# Patient Record
Sex: Female | Born: 1940 | Race: Black or African American | Hispanic: No | Smoking: Former smoker
Health system: Southern US, Community
[De-identification: ages and names within clinical notes are randomized; demographics above are authoritative.]

## PROBLEM LIST (undated history)

## (undated) DIAGNOSIS — E039 Hypothyroidism, unspecified: Secondary | ICD-10-CM

## (undated) DIAGNOSIS — E079 Disorder of thyroid, unspecified: Secondary | ICD-10-CM

## (undated) DIAGNOSIS — Z9221 Personal history of antineoplastic chemotherapy: Secondary | ICD-10-CM

## (undated) DIAGNOSIS — C7931 Secondary malignant neoplasm of brain: Secondary | ICD-10-CM

## (undated) DIAGNOSIS — I509 Heart failure, unspecified: Secondary | ICD-10-CM

## (undated) DIAGNOSIS — I10 Essential (primary) hypertension: Secondary | ICD-10-CM

## (undated) DIAGNOSIS — I4891 Unspecified atrial fibrillation: Secondary | ICD-10-CM

## (undated) DIAGNOSIS — J449 Chronic obstructive pulmonary disease, unspecified: Secondary | ICD-10-CM

## (undated) DIAGNOSIS — C3490 Malignant neoplasm of unspecified part of unspecified bronchus or lung: Secondary | ICD-10-CM

## (undated) DIAGNOSIS — E119 Type 2 diabetes mellitus without complications: Secondary | ICD-10-CM

## (undated) DIAGNOSIS — E785 Hyperlipidemia, unspecified: Secondary | ICD-10-CM

## (undated) DIAGNOSIS — Z923 Personal history of irradiation: Secondary | ICD-10-CM

## (undated) HISTORY — PX: LUNG BIOPSY: SHX232

## (undated) HISTORY — DX: Secondary malignant neoplasm of brain: C79.31

## (undated) HISTORY — PX: OTHER SURGICAL HISTORY: SHX169

## (undated) HISTORY — PX: JOINT REPLACEMENT: SHX530

---

## 2004-05-31 HISTORY — PX: COLONOSCOPY: SHX174

## 2004-06-15 ENCOUNTER — Ambulatory Visit: Payer: Self-pay

## 2004-07-09 ENCOUNTER — Ambulatory Visit: Payer: Self-pay | Admitting: Orthopaedic Surgery

## 2004-10-12 ENCOUNTER — Other Ambulatory Visit: Payer: Self-pay

## 2004-10-12 ENCOUNTER — Inpatient Hospital Stay: Payer: Self-pay | Admitting: Orthopaedic Surgery

## 2005-04-27 ENCOUNTER — Ambulatory Visit: Payer: Self-pay | Admitting: Family Medicine

## 2006-04-28 ENCOUNTER — Ambulatory Visit: Payer: Self-pay | Admitting: Family Medicine

## 2007-05-02 ENCOUNTER — Ambulatory Visit: Payer: Self-pay | Admitting: Family Medicine

## 2008-05-02 ENCOUNTER — Ambulatory Visit: Payer: Self-pay | Admitting: Family Medicine

## 2008-10-25 ENCOUNTER — Ambulatory Visit: Payer: Self-pay | Admitting: Unknown Physician Specialty

## 2009-05-05 ENCOUNTER — Ambulatory Visit: Payer: Self-pay | Admitting: Family Medicine

## 2010-05-14 ENCOUNTER — Ambulatory Visit: Payer: Self-pay | Admitting: Family Medicine

## 2011-06-08 ENCOUNTER — Ambulatory Visit: Payer: Self-pay | Admitting: Family Medicine

## 2011-07-23 ENCOUNTER — Ambulatory Visit: Payer: Self-pay | Admitting: Internal Medicine

## 2011-08-06 ENCOUNTER — Ambulatory Visit: Payer: Self-pay | Admitting: Internal Medicine

## 2011-08-06 LAB — CBC CANCER CENTER
Basophil #: 0 x10 3/mm (ref 0.0–0.1)
Basophil %: 0.4 %
Eosinophil #: 0.2 x10 3/mm (ref 0.0–0.7)
Lymphocyte %: 21.1 %
MCH: 33.9 pg (ref 26.0–34.0)
MCV: 100 fL (ref 80–100)
Monocyte #: 0.8 x10 3/mm — ABNORMAL HIGH (ref 0.0–0.7)
Neutrophil %: 65.8 %
Platelet: 224 x10 3/mm (ref 150–440)
RDW: 15 % — ABNORMAL HIGH (ref 11.5–14.5)

## 2011-08-12 LAB — IRON AND TIBC
Iron Bind.Cap.(Total): 376 ug/dL (ref 250–450)
Iron Saturation: 24 %

## 2011-08-12 LAB — FERRITIN: Ferritin (ARMC): 61 ng/mL (ref 8–388)

## 2011-08-30 ENCOUNTER — Ambulatory Visit: Payer: Self-pay | Admitting: Internal Medicine

## 2011-09-16 LAB — CANCER CENTER HEMATOCRIT: HCT: 44.9 % (ref 35.0–47.0)

## 2011-09-29 ENCOUNTER — Ambulatory Visit: Payer: Self-pay | Admitting: Internal Medicine

## 2011-09-30 LAB — CBC CANCER CENTER
Basophil #: 0 x10 3/mm (ref 0.0–0.1)
Basophil %: 0.6 %
Eosinophil %: 3.1 %
Lymphocyte #: 1.9 x10 3/mm (ref 1.0–3.6)
MCH: 33.3 pg (ref 26.0–34.0)
MCHC: 32.7 g/dL (ref 32.0–36.0)
MCV: 102 fL — ABNORMAL HIGH (ref 80–100)
Monocyte %: 9.2 %
Neutrophil #: 4.5 x10 3/mm (ref 1.4–6.5)
RBC: 4.75 10*6/uL (ref 3.80–5.20)
WBC: 7.4 x10 3/mm (ref 3.6–11.0)

## 2011-10-30 ENCOUNTER — Ambulatory Visit: Payer: Self-pay | Admitting: Internal Medicine

## 2011-11-11 LAB — CBC CANCER CENTER
Basophil #: 0 x10 3/mm (ref 0.0–0.1)
Eosinophil #: 0.2 x10 3/mm (ref 0.0–0.7)
HCT: 47.8 % — ABNORMAL HIGH (ref 35.0–47.0)
HGB: 15.6 g/dL (ref 12.0–16.0)
Lymphocyte #: 1.7 x10 3/mm (ref 1.0–3.6)
Lymphocyte %: 23.6 %
MCHC: 32.7 g/dL (ref 32.0–36.0)
MCV: 101 fL — ABNORMAL HIGH (ref 80–100)
Monocyte #: 0.8 x10 3/mm (ref 0.2–0.9)
Neutrophil #: 4.5 x10 3/mm (ref 1.4–6.5)
Neutrophil %: 62.3 %
Platelet: 229 x10 3/mm (ref 150–440)
RBC: 4.75 10*6/uL (ref 3.80–5.20)
RDW: 14.8 % — ABNORMAL HIGH (ref 11.5–14.5)
WBC: 7.3 x10 3/mm (ref 3.6–11.0)

## 2011-11-25 LAB — CANCER CENTER HEMATOCRIT: HCT: 48.1 % — ABNORMAL HIGH (ref 35.0–47.0)

## 2011-11-29 ENCOUNTER — Ambulatory Visit: Payer: Self-pay | Admitting: Internal Medicine

## 2011-12-16 LAB — CANCER CENTER HEMATOCRIT: HCT: 44.9 % (ref 35.0–47.0)

## 2011-12-30 ENCOUNTER — Ambulatory Visit: Payer: Self-pay | Admitting: Internal Medicine

## 2012-01-06 LAB — CBC CANCER CENTER
Basophil #: 0 x10 3/mm (ref 0.0–0.1)
Eosinophil #: 0.2 x10 3/mm (ref 0.0–0.7)
Lymphocyte #: 1.8 x10 3/mm (ref 1.0–3.6)
Lymphocyte %: 25.4 %
MCHC: 33.5 g/dL (ref 32.0–36.0)
MCV: 100 fL (ref 80–100)
Monocyte %: 11 %
Neutrophil #: 4.3 x10 3/mm (ref 1.4–6.5)
Neutrophil %: 60.7 %
Platelet: 218 x10 3/mm (ref 150–440)
RBC: 4.79 10*6/uL (ref 3.80–5.20)
RDW: 15.4 % — ABNORMAL HIGH (ref 11.5–14.5)

## 2012-01-20 LAB — CANCER CENTER HEMATOCRIT: HCT: 47 %

## 2012-01-30 ENCOUNTER — Ambulatory Visit: Payer: Self-pay | Admitting: Internal Medicine

## 2012-02-29 ENCOUNTER — Ambulatory Visit: Payer: Self-pay | Admitting: Internal Medicine

## 2012-03-23 LAB — CANCER CENTER HEMATOCRIT: HCT: 47.4 % — ABNORMAL HIGH (ref 35.0–47.0)

## 2012-03-31 ENCOUNTER — Ambulatory Visit: Payer: Self-pay | Admitting: Internal Medicine

## 2012-04-20 LAB — CBC CANCER CENTER
Basophil #: 0 x10 3/mm (ref 0.0–0.1)
Basophil %: 0.6 %
Eosinophil #: 0.2 x10 3/mm (ref 0.0–0.7)
Eosinophil %: 2.7 %
HCT: 48.2 % — ABNORMAL HIGH (ref 35.0–47.0)
HGB: 15.8 g/dL (ref 12.0–16.0)
Lymphocyte #: 2 x10 3/mm (ref 1.0–3.6)
Lymphocyte %: 25.5 %
MCH: 32.8 pg (ref 26.0–34.0)
MCHC: 32.7 g/dL (ref 32.0–36.0)
MCV: 100 fL (ref 80–100)
Monocyte #: 0.7 x10 3/mm (ref 0.2–0.9)
Monocyte %: 8.7 %
Neutrophil #: 5 x10 3/mm (ref 1.4–6.5)
Neutrophil %: 62.5 %
Platelet: 193 x10 3/mm (ref 150–440)
RBC: 4.82 10*6/uL (ref 3.80–5.20)
RDW: 14.9 % — ABNORMAL HIGH (ref 11.5–14.5)
WBC: 8 x10 3/mm (ref 3.6–11.0)

## 2012-04-30 ENCOUNTER — Ambulatory Visit: Payer: Self-pay | Admitting: Internal Medicine

## 2012-05-31 ENCOUNTER — Ambulatory Visit: Payer: Self-pay | Admitting: Internal Medicine

## 2012-06-08 ENCOUNTER — Ambulatory Visit: Payer: Self-pay | Admitting: Internal Medicine

## 2012-06-15 LAB — CANCER CENTER HEMATOCRIT: HCT: 47.9 % — ABNORMAL HIGH (ref 35.0–47.0)

## 2012-07-01 ENCOUNTER — Ambulatory Visit: Payer: Self-pay | Admitting: Internal Medicine

## 2012-07-06 LAB — CANCER CENTER HEMATOCRIT: HCT: 45.5 % (ref 35.0–47.0)

## 2012-07-21 ENCOUNTER — Emergency Department: Payer: Self-pay | Admitting: Unknown Physician Specialty

## 2012-07-22 LAB — CBC WITH DIFFERENTIAL/PLATELET
Basophil %: 0.2 %
Eosinophil %: 0.3 %
HGB: 14.9 g/dL (ref 12.0–16.0)
Lymphocyte #: 0.5 10*3/uL — ABNORMAL LOW (ref 1.0–3.6)
MCV: 99 fL (ref 80–100)
Monocyte #: 1.2 x10 3/mm — ABNORMAL HIGH (ref 0.2–0.9)
Neutrophil #: 12.2 10*3/uL — ABNORMAL HIGH (ref 1.4–6.5)
Neutrophil %: 86.9 %
Platelet: 203 10*3/uL (ref 150–440)
RBC: 4.4 10*6/uL (ref 3.80–5.20)
RDW: 14.9 % — ABNORMAL HIGH (ref 11.5–14.5)
WBC: 14 10*3/uL — ABNORMAL HIGH (ref 3.6–11.0)

## 2012-07-22 LAB — MAGNESIUM: Magnesium: 1.7 mg/dL — ABNORMAL LOW

## 2012-07-22 LAB — COMPREHENSIVE METABOLIC PANEL
Alkaline Phosphatase: 63 U/L (ref 50–136)
Anion Gap: 9 (ref 7–16)
BUN: 26 mg/dL — ABNORMAL HIGH (ref 7–18)
Bilirubin,Total: 0.5 mg/dL (ref 0.2–1.0)
Calcium, Total: 9.1 mg/dL (ref 8.5–10.1)
Co2: 29 mmol/L (ref 21–32)
Creatinine: 0.94 mg/dL (ref 0.60–1.30)
Glucose: 239 mg/dL — ABNORMAL HIGH (ref 65–99)
Osmolality: 296 (ref 275–301)
Potassium: 3.3 mmol/L — ABNORMAL LOW (ref 3.5–5.1)
SGOT(AST): 14 U/L — ABNORMAL LOW (ref 15–37)
SGPT (ALT): 19 U/L (ref 12–78)
Sodium: 142 mmol/L (ref 136–145)
Total Protein: 7.3 g/dL (ref 6.4–8.2)

## 2012-07-22 LAB — URINALYSIS, COMPLETE
Bilirubin,UR: NEGATIVE
Blood: NEGATIVE
Hyaline Cast: 4
Ph: 5 (ref 4.5–8.0)
Protein: 100
RBC,UR: 2 /HPF (ref 0–5)
Squamous Epithelial: 3
WBC UR: 3 /HPF (ref 0–5)

## 2012-07-22 LAB — TROPONIN I
Troponin-I: 0.03 ng/mL
Troponin-I: <0.02 ng/mL

## 2012-07-26 ENCOUNTER — Inpatient Hospital Stay: Payer: Self-pay | Admitting: Internal Medicine

## 2012-07-26 LAB — T4, FREE: Free Thyroxine: 1.55 ng/dL — ABNORMAL HIGH (ref 0.76–1.46)

## 2012-07-26 LAB — LIPID PANEL
Ldl Cholesterol, Calc: 66 mg/dL (ref 0–100)
Triglycerides: 72 mg/dL (ref 0–200)
VLDL Cholesterol, Calc: 14 mg/dL (ref 5–40)

## 2012-07-26 LAB — URINALYSIS, COMPLETE
Bacteria: NONE SEEN
Blood: NEGATIVE
Leukocyte Esterase: NEGATIVE
Nitrite: NEGATIVE
Ph: 7 (ref 4.5–8.0)
Protein: NEGATIVE
WBC UR: 1 /HPF (ref 0–5)

## 2012-07-26 LAB — COMPREHENSIVE METABOLIC PANEL
BUN: 14 mg/dL (ref 7–18)
Bilirubin,Total: 0.5 mg/dL (ref 0.2–1.0)
Chloride: 99 mmol/L (ref 98–107)
Osmolality: 277 (ref 275–301)
Potassium: 3.8 mmol/L (ref 3.5–5.1)
SGPT (ALT): 25 U/L (ref 12–78)

## 2012-07-26 LAB — TROPONIN I
Troponin-I: <0.02 ng/mL
Troponin-I: <0.02 ng/mL

## 2012-07-26 LAB — CBC
Platelet: 236 10*3/uL (ref 150–440)
RBC: 4.17 10*6/uL (ref 3.80–5.20)

## 2012-07-26 LAB — HEMOGLOBIN A1C: Hemoglobin A1C: 7.1 % — ABNORMAL HIGH (ref 4.2–6.3)

## 2012-07-26 LAB — APTT: Activated PTT: 29.8 secs (ref 23.6–35.9)

## 2012-07-26 LAB — CK TOTAL AND CKMB (NOT AT ARMC)
CK, Total: 88 U/L (ref 21–215)
CK-MB: <0.5 ng/mL — ABNORMAL LOW (ref 0.5–3.6)

## 2012-07-27 LAB — CBC WITH DIFFERENTIAL/PLATELET
Basophil #: 0.1 10*3/uL (ref 0.0–0.1)
Eosinophil %: 0.8 %
HCT: 38 % (ref 35.0–47.0)
HGB: 12.1 g/dL (ref 12.0–16.0)
Lymphocyte #: 1.1 10*3/uL (ref 1.0–3.6)
Lymphocyte %: 9.5 %
MCH: 31.6 pg (ref 26.0–34.0)
MCHC: 31.9 g/dL — ABNORMAL LOW (ref 32.0–36.0)
Monocyte #: 1.3 x10 3/mm — ABNORMAL HIGH (ref 0.2–0.9)
Monocyte %: 11.1 %
Platelet: 251 10*3/uL (ref 150–440)
RBC: 3.83 10*6/uL (ref 3.80–5.20)
WBC: 12.1 10*3/uL — ABNORMAL HIGH (ref 3.6–11.0)

## 2012-07-27 LAB — BASIC METABOLIC PANEL
Anion Gap: 7 (ref 7–16)
Co2: 31 mmol/L (ref 21–32)
Creatinine: 0.61 mg/dL (ref 0.60–1.30)
EGFR (African American): >60
EGFR (Non-African Amer.): >60
Glucose: 144 mg/dL — ABNORMAL HIGH (ref 65–99)
Osmolality: 281 (ref 275–301)

## 2012-07-27 LAB — MAGNESIUM: Magnesium: 1.8 mg/dL

## 2012-07-29 ENCOUNTER — Ambulatory Visit: Payer: Self-pay

## 2012-07-29 ENCOUNTER — Ambulatory Visit: Payer: Self-pay | Admitting: Internal Medicine

## 2012-08-14 ENCOUNTER — Inpatient Hospital Stay: Payer: Self-pay | Admitting: Internal Medicine

## 2012-08-14 LAB — CBC WITH DIFFERENTIAL/PLATELET
Basophil #: 0 10*3/uL (ref 0.0–0.1)
Eosinophil #: 0 10*3/uL (ref 0.0–0.7)
Eosinophil %: 0.1 %
HGB: 12.9 g/dL (ref 12.0–16.0)
Lymphocyte #: 0.6 10*3/uL — ABNORMAL LOW (ref 1.0–3.6)
MCH: 31.1 pg (ref 26.0–34.0)
MCHC: 31.9 g/dL — ABNORMAL LOW (ref 32.0–36.0)
Monocyte #: 0.9 x10 3/mm (ref 0.2–0.9)
Monocyte %: 8.9 %
Neutrophil #: 8.2 10*3/uL — ABNORMAL HIGH (ref 1.4–6.5)
Neutrophil %: 84.1 %
RDW: 15.7 % — ABNORMAL HIGH (ref 11.5–14.5)
WBC: 9.7 10*3/uL (ref 3.6–11.0)

## 2012-08-14 LAB — COMPREHENSIVE METABOLIC PANEL
Albumin: 2.4 g/dL — ABNORMAL LOW (ref 3.4–5.0)
Alkaline Phosphatase: 65 U/L (ref 50–136)
BUN: 19 mg/dL — ABNORMAL HIGH (ref 7–18)
Calcium, Total: 8.1 mg/dL — ABNORMAL LOW (ref 8.5–10.1)
Chloride: 101 mmol/L (ref 98–107)
Creatinine: 0.73 mg/dL (ref 0.60–1.30)
EGFR (Non-African Amer.): >60
Glucose: 147 mg/dL — ABNORMAL HIGH (ref 65–99)
Potassium: 4.7 mmol/L (ref 3.5–5.1)
SGPT (ALT): 29 U/L (ref 12–78)
Sodium: 137 mmol/L (ref 136–145)
Total Protein: 7 g/dL (ref 6.4–8.2)

## 2012-08-14 LAB — CK TOTAL AND CKMB (NOT AT ARMC)
CK, Total: 48 U/L (ref 21–215)
CK, Total: 43 U/L (ref 21–215)
CK-MB: 0.6 ng/mL (ref 0.5–3.6)

## 2012-08-14 LAB — TROPONIN I: Troponin-I: <0.02 ng/mL

## 2012-08-15 DIAGNOSIS — I7100 Dissection of unspecified site of aorta: Secondary | ICD-10-CM | POA: Insufficient documentation

## 2012-08-15 LAB — BASIC METABOLIC PANEL
Anion Gap: 4 — ABNORMAL LOW (ref 7–16)
Calcium, Total: 8.1 mg/dL — ABNORMAL LOW (ref 8.5–10.1)
Chloride: 100 mmol/L (ref 98–107)
Co2: 35 mmol/L — ABNORMAL HIGH (ref 21–32)
EGFR (African American): >60
EGFR (Non-African Amer.): >60
Glucose: 181 mg/dL — ABNORMAL HIGH (ref 65–99)
Potassium: 4.7 mmol/L (ref 3.5–5.1)
Sodium: 139 mmol/L (ref 136–145)

## 2012-08-15 LAB — URINALYSIS, COMPLETE
Bilirubin,UR: NEGATIVE
Glucose,UR: NEGATIVE mg/dL (ref 0–75)
Ketone: NEGATIVE
Ph: 5 (ref 4.5–8.0)
RBC,UR: 103 /HPF (ref 0–5)
Specific Gravity: 1.018 (ref 1.003–1.030)
WBC UR: 5 /HPF (ref 0–5)

## 2012-08-15 LAB — CBC WITH DIFFERENTIAL/PLATELET
Basophil #: 0 10*3/uL (ref 0.0–0.1)
HGB: 12.9 g/dL (ref 12.0–16.0)
Lymphocyte %: 3.7 %
MCH: 31.1 pg (ref 26.0–34.0)
MCHC: 31.9 g/dL — ABNORMAL LOW (ref 32.0–36.0)
MCV: 97 fL (ref 80–100)
Monocyte %: 1.1 %
Neutrophil #: 6.6 10*3/uL — ABNORMAL HIGH (ref 1.4–6.5)
RBC: 4.14 10*6/uL (ref 3.80–5.20)
RDW: 15.7 % — ABNORMAL HIGH (ref 11.5–14.5)
WBC: 6.9 10*3/uL (ref 3.6–11.0)

## 2012-08-15 LAB — TROPONIN I: Troponin-I: <0.02 ng/mL

## 2012-09-21 ENCOUNTER — Inpatient Hospital Stay: Payer: Self-pay | Admitting: Surgery

## 2012-09-21 LAB — APTT: Activated PTT: 31.7 secs (ref 23.6–35.9)

## 2012-09-21 LAB — CBC
HCT: 35.4 % (ref 35.0–47.0)
MCHC: 31.9 g/dL — ABNORMAL LOW (ref 32.0–36.0)
MCV: 93 fL (ref 80–100)
Platelet: 286 10*3/uL (ref 150–440)
RBC: 3.82 10*6/uL (ref 3.80–5.20)
RDW: 16.5 % — ABNORMAL HIGH (ref 11.5–14.5)
WBC: 9.9 10*3/uL (ref 3.6–11.0)

## 2012-09-21 LAB — BASIC METABOLIC PANEL
BUN: 12 mg/dL (ref 7–18)
Chloride: 101 mmol/L (ref 98–107)
Co2: 31 mmol/L (ref 21–32)
Creatinine: 0.72 mg/dL (ref 0.60–1.30)
EGFR (Non-African Amer.): >60
Osmolality: 284 (ref 275–301)

## 2012-09-21 LAB — PROTIME-INR: INR: 1.4

## 2012-09-22 LAB — BASIC METABOLIC PANEL
Anion Gap: 4 — ABNORMAL LOW (ref 7–16)
BUN: 16 mg/dL (ref 7–18)
Co2: 33 mmol/L — ABNORMAL HIGH (ref 21–32)
Creatinine: 0.66 mg/dL (ref 0.60–1.30)
EGFR (African American): >60
EGFR (Non-African Amer.): >60
Glucose: 129 mg/dL — ABNORMAL HIGH (ref 65–99)
Osmolality: 284 (ref 275–301)
Potassium: 3.7 mmol/L (ref 3.5–5.1)

## 2012-09-22 LAB — CBC WITH DIFFERENTIAL/PLATELET
Basophil #: 0 10*3/uL (ref 0.0–0.1)
Eosinophil #: 0.3 10*3/uL (ref 0.0–0.7)
Eosinophil %: 2.8 %
Lymphocyte #: 1.4 10*3/uL (ref 1.0–3.6)
Lymphocyte %: 14.9 %
MCHC: 32.5 g/dL (ref 32.0–36.0)
MCV: 93 fL (ref 80–100)
Monocyte %: 12.4 %
Neutrophil %: 69.5 %
RBC: 3.58 10*6/uL — ABNORMAL LOW (ref 3.80–5.20)

## 2012-09-23 LAB — PROTIME-INR: Prothrombin Time: 18.8 secs — ABNORMAL HIGH (ref 11.5–14.7)

## 2012-12-19 ENCOUNTER — Ambulatory Visit: Payer: Self-pay | Admitting: Cardiology

## 2013-05-31 DIAGNOSIS — C3490 Malignant neoplasm of unspecified part of unspecified bronchus or lung: Secondary | ICD-10-CM

## 2013-05-31 HISTORY — DX: Malignant neoplasm of unspecified part of unspecified bronchus or lung: C34.90

## 2013-06-11 ENCOUNTER — Ambulatory Visit: Payer: Self-pay | Admitting: Internal Medicine

## 2013-07-16 DIAGNOSIS — Z7901 Long term (current) use of anticoagulants: Secondary | ICD-10-CM | POA: Insufficient documentation

## 2013-08-01 DIAGNOSIS — E1140 Type 2 diabetes mellitus with diabetic neuropathy, unspecified: Secondary | ICD-10-CM | POA: Insufficient documentation

## 2013-08-06 DIAGNOSIS — K208 Other esophagitis without bleeding: Secondary | ICD-10-CM | POA: Insufficient documentation

## 2014-03-17 DIAGNOSIS — I700 Atherosclerosis of aorta: Secondary | ICD-10-CM | POA: Insufficient documentation

## 2014-03-18 IMAGING — CT CT HEAD WITHOUT CONTRAST
1 series · 15 of 30 positions shown, 19 images · non-contrast
Comparison: none

REASON FOR EXAM: syncope
COMMENTS:

[Series 2: soft tissue · axial · 0.42mm/px · z∈[-156,-16]mm · 15 of 32 slices shown, 19 images]
[im 2/32  brain]
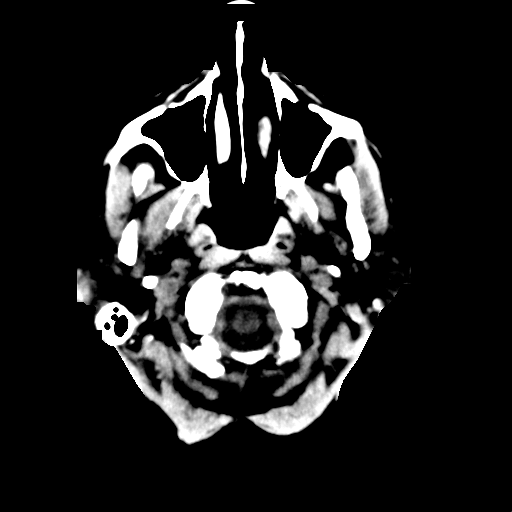
[im 2/32  bone]
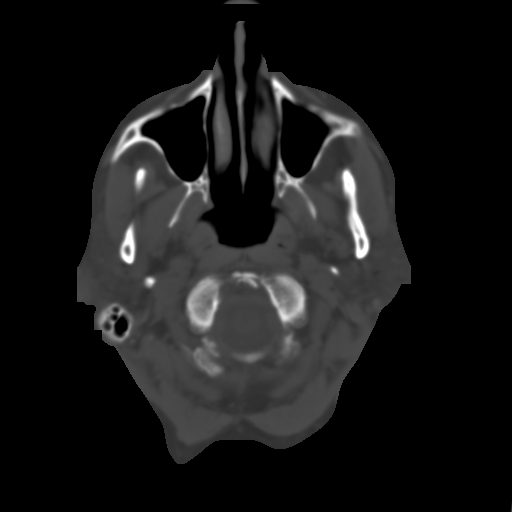
[im 4/32  brain]
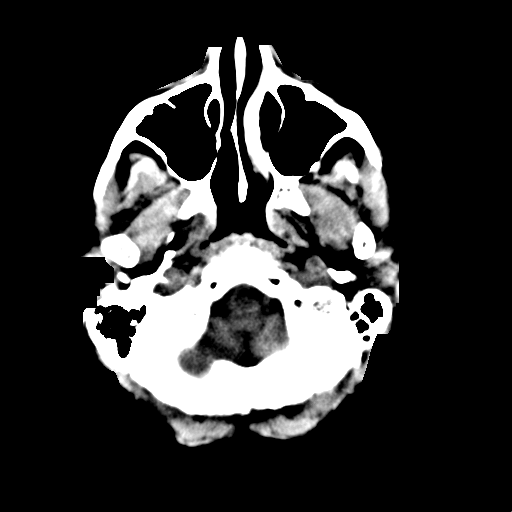
[im 6/32  brain]
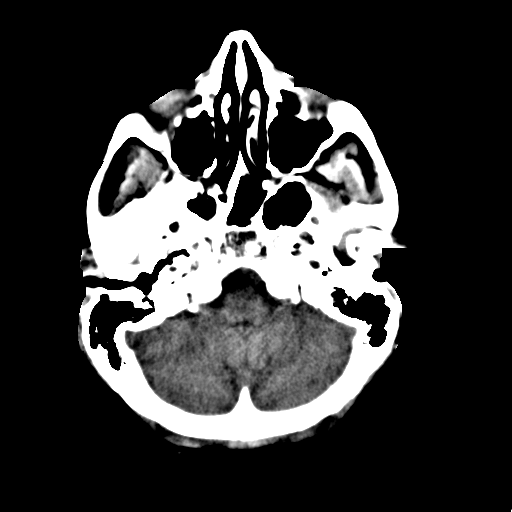
[im 8/32  brain]
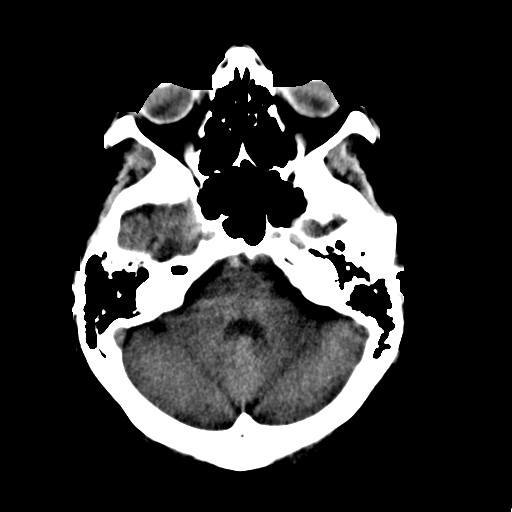
[im 10/32  brain]
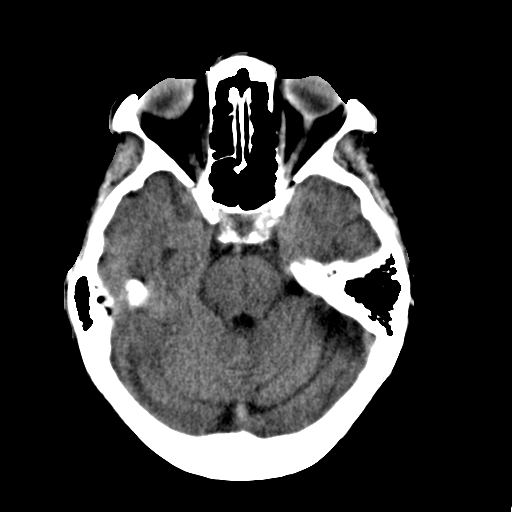
[im 10/32  bone]
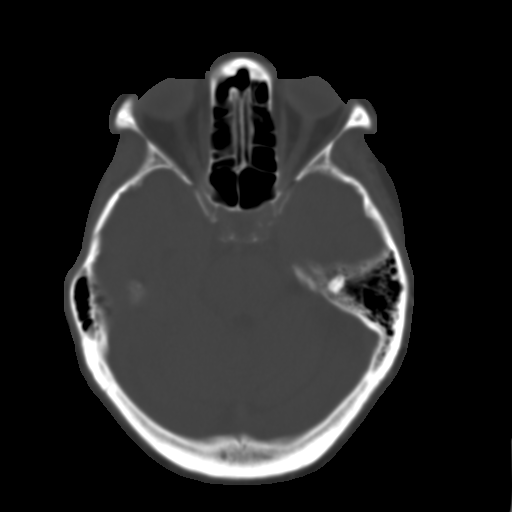
[im 12/32  brain]
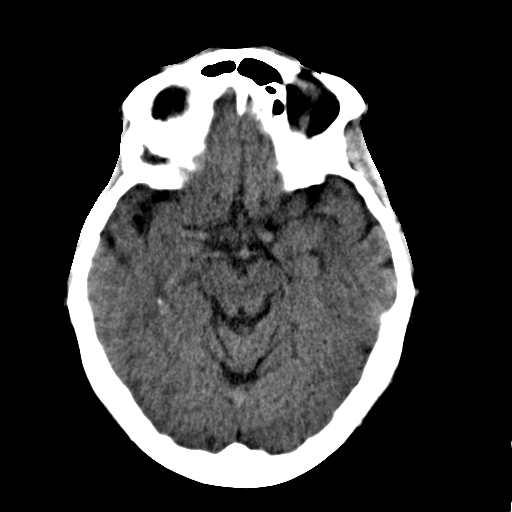
[im 14/32  brain]
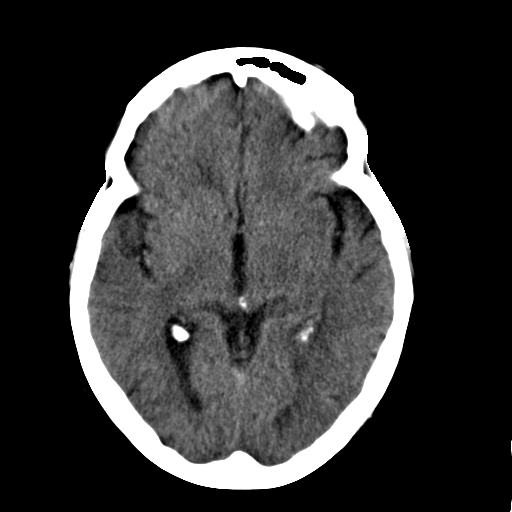
[im 17/32  brain]
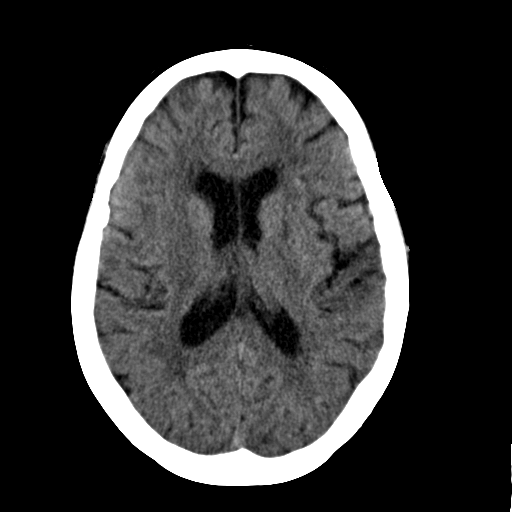
[im 18/32  brain]
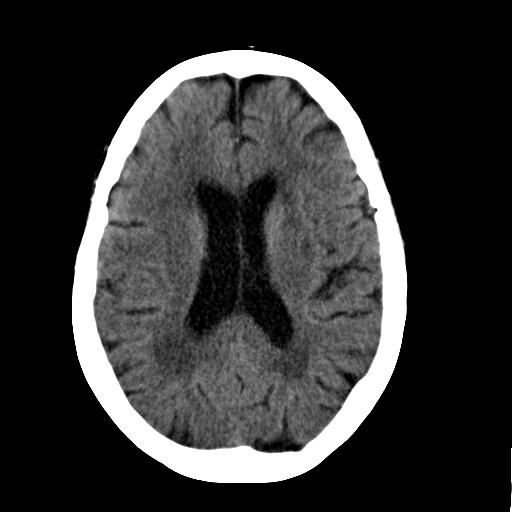
[im 18/32  bone]
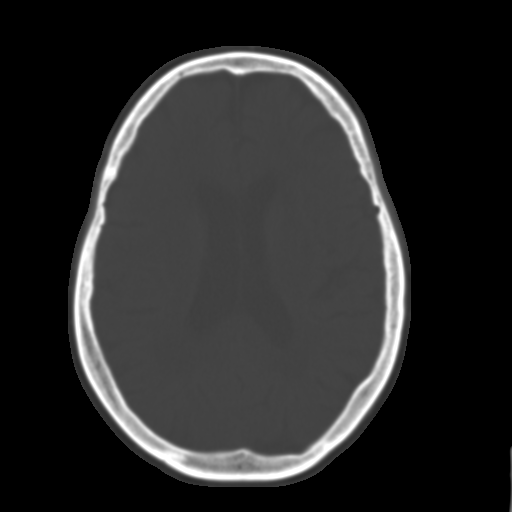
[im 20/32  brain]
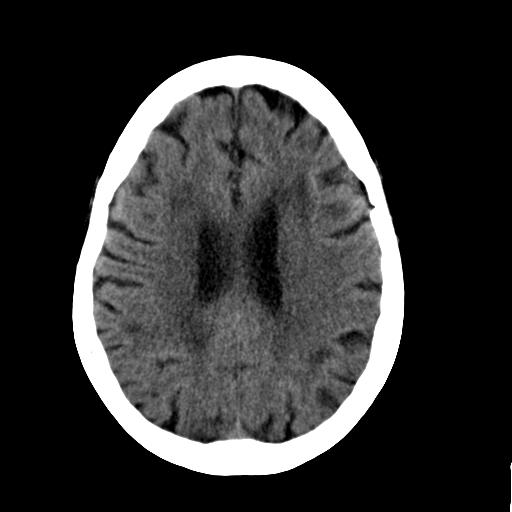
[im 22/32  brain]
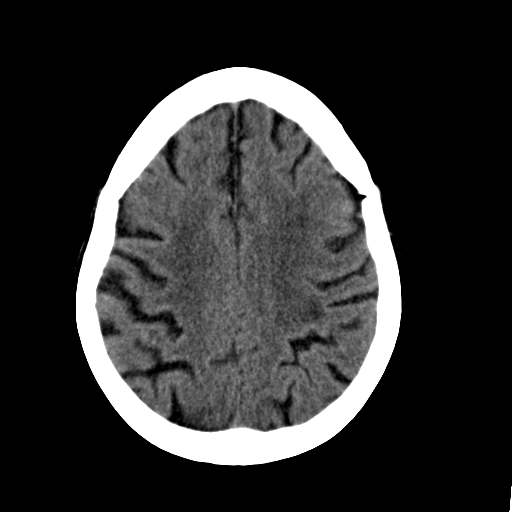
[im 24/32  brain]
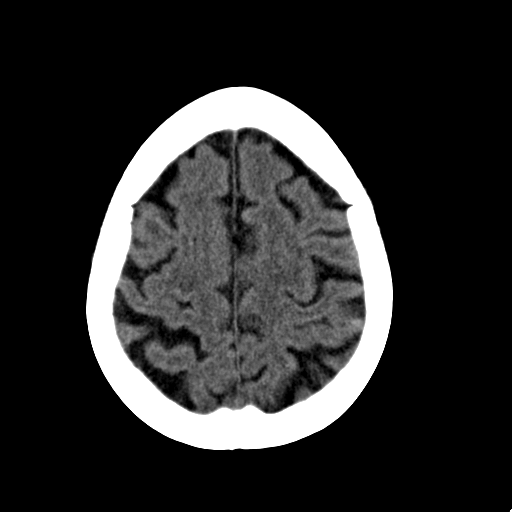
[im 26/32  brain]
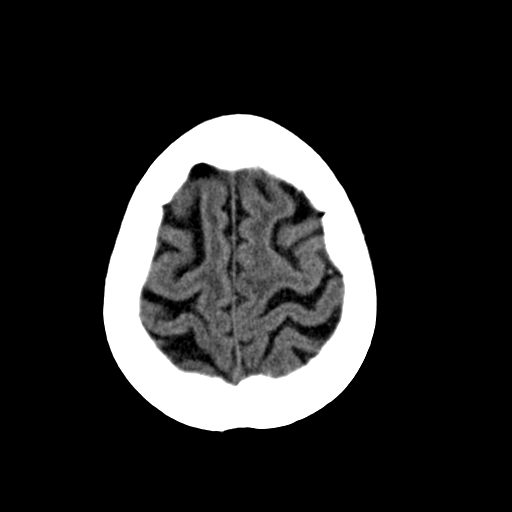
[im 26/32  bone]
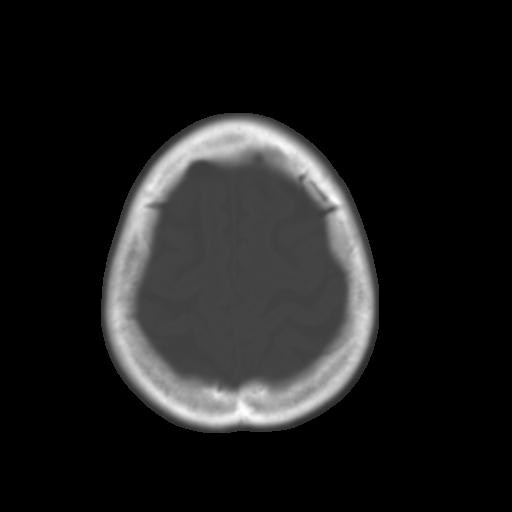
[im 28/32  brain]
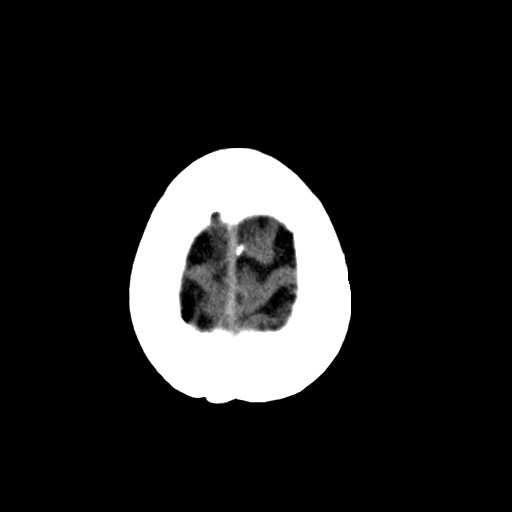
[im 30/32  brain]
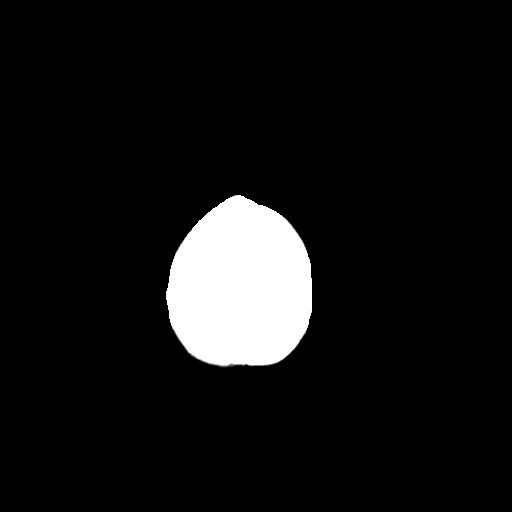

[15 of 30 positions shown; findings below may reference images not displayed]

PROCEDURE:     CT  - CT HEAD WITHOUT CONTRAST  - July 22, 2012  [DATE]

RESULT:     Axial noncontrast CT scanning was performed through the brain
with reconstructions at 5 mm intervals and slice thicknesses.

There is mild diffuse cerebral and cerebellar atrophy consistent with the
patient's age. There is no intracranial hemorrhage nor intracranial mass
effect or evidence of an evolving ischemic infarction. There are decreased
densities in the deep white matter of both cerebral hemispheres and in the
left basal ganglia consistent with the sequelae of chronic small vessel
ischemic type change. At bone window settings the observed portions of the
paranasal sinuses and mastoid air cells are clear. There is no evidence of
an acute skull fracture.
IMPRESSION: 1. There is no evidence of an acute intracranial hemorrhage nor other
evidence of acute intracranial injury.
2. There are white matter density changes consistent with chronic small
vessel ischemia.
3. There is no evidence of an acute skull fracture.

A preliminary report was sent to the [HOSPITAL] the conclusion
of the study.

[REDACTED]

## 2014-03-22 DIAGNOSIS — G4733 Obstructive sleep apnea (adult) (pediatric): Secondary | ICD-10-CM | POA: Insufficient documentation

## 2014-07-16 ENCOUNTER — Ambulatory Visit: Payer: Self-pay | Admitting: Internal Medicine
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
## 2014-09-20 NOTE — H&P (Signed)
PATIENT NAME:  Kristen [name], Kristen [name] MR#:  [id number] DATE OF BIRTH:  31-Jan-1941  DATE OF ADMISSION:  09/21/2012  ADMITTING PHYSICIAN: Rodena Goldmann, III, MD  PRIMARY CARE PHYSICIAN: Frazier Richards, MD  PULMONOLOGIST:  Wallene Huh, MD    CHIEF COMPLAINT: Shortness of breath and chest pain.   BRIEF HISTORY: The patient is a 74 year old woman seen in the Emergency Room with the onset of shortness of breath and mild left chest pain yesterday. The onset was sudden and prompted her to present to the Emergency Room for further evaluation as her symptoms did not improve.   She has a recent history of identification of a left upper lobe mass in her lung. Bronchoscopy was performed which was not diagnostic, and she underwent a needle biopsy 4 days ago. Biopsy results are not available as yet. The workup in the Emergency Room suggested a hemopneumothorax on the left side likely consistent with an iatrogenic pneumothorax from her needle biopsy. Thoracic Surgery service was consulted, and the chest tube was placed. Surgical service is consulted for admission.   During her last admission, she was noted to be in significant atrial fibrillation with a rapid ventricular response and was transferred to [facility] for an ablation procedure. At the same time, she was noted to have what appeared to be an aortic dissection with a surrounding hematoma. She was evaluated for that process at [facility], placed on blood pressure control and told that she would likely need surgical intervention once her atrial fibrillation was stabilized. She presents to [facility] with acute symptoms and is admitted for further evaluation.   PAST MEDICAL HISTORY: Her medical problems are numerous.  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   PAST SURGICAL HISTORY:  Cholecystectomy, bilateral knee surgery and a thyroid surgery.   CURRENT MEDICATIONS: Include Carvedilol 3.125 p.o. b.i.d., Lasix 40 mg p.o. daily, Lipitor 20 mg p.o. daily, magnesium oxide 400 mg once a day, metformin 1000 mg b.i.d. and warfarin 5 mg p.o. daily.  LABORATORY DATA:  Most recent in the Emergency Room demonstrate a hemoglobin of 11.3, normal white blood cell count. Prothrombin time of 16.8 with an INR of 1.4.   PHYSICAL EXAMINATION: GENERAL: She is an alert, comfortable woman having dinner at the time of my examination. A chest tube has been placed by the Thoracic Surgery service. It is draining serosanguineous fluid.  VITALS: Blood pressure is 130/69, heart rate 68 and regular. Oxygen saturation is 99% on 2 liters.  HEENT: No scleral icterus. No facial deformity. She appears a little pale.  NECK: Supple without adenopathy. Trachea is midline.  CHEST: Clear with good breath sounds bilaterally. Chest tube is placed on the left side.  CARDIAC: No murmurs or gallops to my ear. She seems to be in normal sinus rhythm.  ABDOMEN: Her abdomen is soft, nontender, with no organomegaly, no rebound, no guarding. No masses. No hernias.  EXTREMITIES: Lower extremity exam reveals mild edema, full range of motion.  PSYCHIATRIC: Normal orientation, normal affect.   IMPRESSION AND PLAN: This woman presents with a likely traumatic pneumothorax from her previous needle biopsy. A chest tube has been placed. She also has what appears to be an aortic dissection. This has not been followed recently by CT scan. She told tells me that the thoracic surgeons at [facility] have recommended delaying any intervention until she has stabilized from her atrial ablation. I will  admit her for chest tube followup.  At the present time, she has a small air leak.   This plan has been discussed with the patient, and she is in agreement.  ____________________________ Micheline Maze, MD rle:cb D: 09/21/2012 17:19:27  ET T: 09/21/2012 17:36:39 ET JOB#: [id number] cc: Rodena Goldmann III, MD, <Dictator> Timothy E. Genevive Bi, MD Ocie Cornfield. Ouida Sills, MD Herbon E. Raul Del, [place] MD ELECTRONICALLY SIGNED 09/22/2012 10:45

## 2014-09-20 NOTE — Discharge Summary (Signed)
PATIENT NAME:  Kristen, [name] MR#:  [id number] DATE OF BIRTH:  [birth year]  DATE OF ADMISSION:  08/14/2012 DATE OF DISCHARGE:  08/15/2012   DISCHARGE DIAGNOSES: 1.  Type A thoracic aortic aneurysm dissection.  2.  Thoracic aortic plaque with hematoma.  3.  Large pleural effusion.  4.  Atrial fibrillation, rapid on admission, now improved.   DISCHARGE MEDICATIONS: Ultimately will be per [facility] Presently she is on amiodarone 400 mg daily, diltiazem 60 mg p.o. q.6 hours, furosemide 40 mg IV every 12 hours, sliding scale insulin before meals and at bedtime, Solu-Medrol 80 mg q.8 hours, Toprol-XL 200 mg daily, morphine p.r.n., Zofran p.r.n., Protonix IV 40 mg every 8 hours,  potassium chloride 20 mEq daily, Xarelto 20 mg daily, levofloxacin 250 mg daily.   HISTORY AND PHYSICAL: Please see detailed dictated history and physical by Dr. Doy Hutching on admission.  Briefly admitted recent atrial fibrillation, more tachycardic in the office and hypoxic.   HOSPITAL COURSE: Found to have a large left pleural effusion and was very tight as far as her breathing.  Solu-Medrol and Levaquin was started. CT scan ultimately was done to follow up the left pleural effusion with the above diagnosis being found. Dr. Genevive Bi, who is the thoracic surgeon, does not do cardiac and/or upper thoracic aneurysms and our vascular surgeons do not do thoracic aneurysm repair; therefore transfer needs to be done. Dr. Miquel Dunn who is her cardiologist, had been consulted. He reviewed the case with his colleagues from [facility] and they have agreed to transfer the patient. This is being arranged rapidly given the above situation. He ultimately may need a thoracentesis as well. There was a small mass seen on the CT per radiology as well.   Please note with help with transfer, evaluating the patient, dictating, etc., took approximately an hour and a half to do all the discharge  tasks.   ____________________________ Ocie Cornfield. Ouida Sills, MD mwa:ct D: 08/15/2012 11:57:09 ET T: 08/15/2012 12:49:00 ET JOB#: [id number]  cc: Ocie Cornfield. Ouida Sills, MD, <Dictator> Kirk Ruths MD ELECTRONICALLY SIGNED 08/18/2012 7:08

## 2014-09-20 NOTE — H&P (Signed)
PATIENT NAME:  Kristen, [name] MR#:  [id number] DATE OF BIRTH:  Mar 09, 1941  DATE OF ADMISSION:  08/14/2012  FAMILY PHYSICIAN: Frazier Richards, MD   REASON FOR ADMISSION: Rapid A-flutter with acute respiratory failure and congestive heart failure.   HISTORY OF PRESENT ILLNESS: The patient is a 74 year old female followed by Dr. Ouida [name] with a significant history of benign hypertension, hypothyroidism, and type 2 diabetes who was hospitalized within the past 3 to 4 weeks with new onset atrial flutter. She was discharged on amiodarone and Toprol. She has continued to have problems with rate control. Diltiazem was recently added. She presents to the office today with 1 to 2 day history of worsening shortness of breath. In the office, the patient was noted to be hypoxic with a sat of 83% on room air, in rapid atrial flutter. She denies chest pain. She is having some weakness and peripheral edema. She is now admitted for further evaluation.   PAST MEDICAL HISTORY: 1.  New onset atrial flutter.  2.  Benign hypertension.  3.  Hypothyroidism.  4.  Type 2 diabetes.  5.  Obesity.  6.  Chronic obstructive pulmonary disease/tobacco abuse.  7.  Osteoarthritis.  8.  History of colonic polyps.  9.  Peripheral neuropathy.  10.  History of beta blocker-induced bradycardia.  11.  Status post cholecystectomy.  12.  Status post bilateral knee surgery.  13.  Status post thyroidectomy.   MEDICATIONS: 1.  Synthroid 50 mcg p.o. daily.  2.  Metformin 500 mg p.o. b.i.d.  3.  Prilosec 20 mg p.o. daily.  4.  Vitamin D 2000 units p.o. daily.  5.  Toprol-XL 200 mg p.o. daily.  6.  Amiodarone 400 mg p.o. daily.  7.  Xarelto 20 mg p.o. daily.  8.  Cardizem CD 180 mg p.o. daily.  9.  Hydrochlorothiazide 12.5 mg p.o. daily.   ALLERGIES: No known drug allergies.   SOCIAL HISTORY: The patient quit smoking 1 month ago. Denies alcohol abuse.   FAMILY HISTORY: Positive for lung cancer, CHF, stroke, and bone cancer.    REVIEW OF SYSTEMS:  As per HPI.  PHYSICAL EXAMINATION: GENERAL: The patient is acutely ill-appearing, in moderate distress.  VITAL SIGNS: Remarkable for a blood pressure of 128/94 with a heart rate of 125 and a sat of 83% on room air. Weight 199.  HEENT: Normocephalic, atraumatic. Pupils equally round and reactive to light and accommodation. Extraocular movements intact. Sclerae are anicteric. Conjunctivae are clear. Oropharynx is clear.  NECK: Supple with JVD noted to the angle of the jaw. No bruits were noted.  PULMONARY:  Lungs revealed rales approximately one-half way up bilaterally. No wheezes or rhonchi. No dullness.  CARDIAC: Revealed a rapid rate with a regular rhythm. Normal S1 and S2. There is a 2/6 systolic murmur noted throughout the precordium. No rubs or gallops are present.  ABDOMEN: Soft and nontender with normoactive bowel sounds. No organomegaly or masses were appreciated. No hernias or bruits were noted.  EXTREMITIES: 2+ edema with stasis changes. Pulses were 2+ bilaterally.  SKIN: Warm and dry without rash or lesions.  NEUROLOGIC: Cranial nerves II through XII grossly intact. Deep tendon reflexes were symmetric. Motor and sensory exam is nonfocal.  PSYCH: The patient was alert and oriented to person, place, and time. She was cooperative and used good judgment.   DIAGNOSTIC DATA: EKG revealed atrial flutter at 125 beats per minute with diffuse ST-T wave changes, which were nonspecific.   ASSESSMENT: 1.  Rapid atrial flutter.  2.  Acute diastolic congestive heart failure.  3.  Benign hypertension.  4.  Hypothyroidism.  5.  Hyperlipidemia.  6.  Type 2 diabetes.  7.  Acute respiratory failure.  8.  Chronic obstructive pulmonary disease/tobacco abuse.   PLAN: The patient will be admitted to the intensive care unit on a Cardizem drip with Lovenox and p.o. Toprol and amiodarone. We will obtain a chest x-ray and labs on admission. We will follow serial cardiac enzymes. We  will follow her sugars with Accu-Cheks before meals and at bedtime and add sliding scale insulin as needed. We will begin IV Lasix for diuresis. Will consult cardiology urgently. Follow up labs and chest x-ray in the morning. Begin oxygen supplementation and wean as tolerated. Further treatment and evaluation will depend upon the patient's progress.   TOTAL TIME SPENT: 50 minutes. ____________________________ Leonie Douglas Doy Hutching, MD jds:sb D: 08/14/2012 11:21:51 ET T: 08/14/2012 11:42:25 ET JOB#: [id number]  cc: Leonie Douglas. Doy Hutching, MD, <Dictator> Ocie Cornfield. Kristen Sills, MD JEFFREY Lennice Sites MD ELECTRONICALLY SIGNED 08/14/2012 20:23

## 2014-09-20 NOTE — Consult Note (Signed)
Brief Consult Note: Diagnosis: AFL, CHF, COPD.   Patient was seen by consultant.   Consult note dictated.   Comments: REC  Agree with current therapy, dc Lovenox, restart Xarelto, dc aspirin, start dilt 60 mg Q6, don't start dilt drip.  Electronic Signatures: Isaias Cowman (MD)  (Signed 17-Mar-14 15:23)  Authored: Brief Consult Note   Last Updated: 17-Mar-14 15:23 by Isaias Cowman (MD)

## 2014-09-20 NOTE — Op Note (Signed)
PATIENT NAME:  Kristen [name], Kristen [name] MR#:  [id number] DATE OF BIRTH:  March 29, 1941  DATE OF PROCEDURE:  08/14/2012  PREOPERATIVE DIAGNOSES: 1.  Respiratory failure with hypoxia.  2.  Atrial fibrillation.  3.  Diastolic congestive heart failure.  4.  Left pleural effusion.  5.  Poor venous access.   POSTOPERATIVE DIAGNOSES: 1.  Respiratory failure with hypoxia.  2.  Atrial fibrillation.  3.  Diastolic congestive heart failure.  4.  Left pleural effusion.  5.  Poor venous access.  PROCEDURES:  1. Ultrasound guidance for vascular access to right basilic vein.  2. Fluoroscopic guidance for placement of catheter.  3. Insertion of peripherally inserted central venous catheter, double lumen, right arm.  SURGEON:  Cianni Manny  ANESTHESIA:  Local.   ESTIMATED BLOOD LOSS:  Minimal.   INDICATION FOR PROCEDURE: This is a 74 year old female with poor venous access and multiple medical issues as described above. We are asked to place a durable venous access with PICC line. Risks and benefits were discussed.   DESCRIPTION OF PROCEDURE:  The patient's right arm was sterilely prepped and draped, and a sterile surgical field was created. The right basilic vein was accessed under direct ultrasound guidance without difficulty with a micropuncture needle and permanent image was recorded. 0.018 wire was then placed into the superior vena cava. Peel-away sheath was placed over the wire. A double lumen peripherally inserted central venous catheter was then placed over the wire and the wire and peel-away sheath were removed. The catheter tip was placed into the superior vena cava and was secured at the skin at 37 cm with a sterile dressing. The catheter withdrew blood well and flushed easily with heparinized saline. The patient tolerated procedure well.    ____________________________ Algernon Huxley, MD jsd:ce D: 08/16/2012 15:43:14 ET T: 08/16/2012 15:52:47 ET JOB#: [id number]  cc: Algernon Huxley, MD, <Dictator> Algernon Huxley  MD ELECTRONICALLY SIGNED 08/18/2012 14:17

## 2014-09-20 NOTE — Consult Note (Signed)
PATIENT NAME:  Kristen [name], Kristen [name] MR#:  [id number] DATE OF BIRTH:  September 14, 1940  DATE OF CONSULTATION:  08/14/2012  CONSULTING PHYSICIAN:  Isaias Cowman, MD PRIMARY CARE PHYSICIAN: Dr. Ouida Sills  CHIEF COMPLAINT: Shortness of breath.   REASON FOR CONSULTATION: Consultation requested for evaluation of atrial flutter.   HISTORY OF PRESENT ILLNESS: The patient is a 74 year old female with history of hypertension, diabetes, with recent hospitalization of atrial flutter. Diltiazem was recently added to her regimen for rate control. The patient presented to [facility] today with a 1 to 2-day history of shortness of breath. She was noted to be hypoxic and in atrial flutter with a rapid rate. The patient was admitted to the  CCU for diltiazem drip, which was never started. Admission labs were notable for a negative troponin of 0.02. The patient denies chest pain or palpitations.   PAST MEDICAL HISTORY: 1. Atrial fibrillation/flutter.  2. COPD.  3. Hypertension.  4. Type 2 diabetes.  5. Hypothyroidism.  6. Osteoarthritis.   MEDICATIONS ON ADMISSION: Toprol-XL 200 mg daily, amiodarone 400 mg daily, Xarelto 20 mg daily, Cardizem CD 180 mg daily, hydrochlorothiazide 12.5 mg daily, Synthroid 50 mcg daily, metformin 500 mg b.i.d, Prilosec 20 mg daily.   SOCIAL HISTORY: The patient just quit smoking 1-2 months ago.   FAMILY HISTORY: No immediate family history of coronary artery disease or myocardial infarction.   REVIEW OF SYSTEMS:   CONSTITUTIONAL: No fever or chills.  EYES: No blurry vision.  EARS: No hearing loss.  RESPIRATORY: Shortness of breath as described above.  CARDIOVASCULAR: The patient denies chest pain or palpitations.  GASTROINTESTINAL: No nausea, vomiting, diarrhea or constipation.  GENITOURINARY: No dysuria or hematuria.  ENDOCRINE: No polyuria or polydipsia.  MUSCULOSKELETAL: No arthralgias or myalgias.  NEUROLOGICAL: No focal muscle weakness or numbness.  PSYCHOLOGICAL: No  depression or anxiety.   PHYSICAL EXAMINATION: VITAL SIGNS: Blood pressure 145/93, pulse currently is 90 to 110 irregularly irregular, respirations 34, pulse ox 91%.  HEENT: Pupils are equal, reactive to light and accommodation.  NECK: Supple without thyromegaly.  LUNGS: Decreased breath sounds in all lung fields.  CARDIOVASCULAR: Normal JVP. Normal PMI. Regular rate and rhythm. Normal S1, S2. No appreciable gallop, murmur or rub.  ABDOMEN: Soft and nontender. Pulses were intact bilaterally.  MUSCULOSKELETAL: Normal muscle tone.  NEUROLOGICAL: The patient is alert and oriented x 3. Motor and sensory are both grossly intact.   IMPRESSION: This is a 74 year old female with probable COPD flare, hypoxia and associated atrial flutter with a rapid rate. Rate appears to have improved without starting diltiazem bolus or drip.   RECOMMENDATIONS: 1. Agree with overall current therapy.  2. Continue current oral medications.  3. Continue diuresis for probable diastolic congestive heart failure.  4. Discontinue Lovenox.  5. Resume Xarelto.  ____________________________ Isaias Cowman, MD ap:cb D: 08/14/2012 15:18:08 ET T: 08/14/2012 16:16:02 ET JOB#: [id number] cc: Isaias Cowman, MD, <Dictator> Isaias Cowman MD ELECTRONICALLY SIGNED 08/30/2012 12:26

## 2014-09-20 NOTE — Discharge Summary (Signed)
PATIENT NAME:  Kristen [name], Kristen [name] MR#:  [id number] DATE OF BIRTH:  02-06-1941  DATE OF ADMISSION:  09/21/2012  DATE OF DISCHARGE:  09/23/2012  BRIEF HISTORY: The patient is a 74 year old woman who recently underwent a left upper lobe percutaneous biopsy for a newly-discovered lesion in that area. Procedure was performed at [facility] 3 days prior to admission. She began to develop some significant chest pain, mild shortness of breath. She presented to her primary care doctor's office, who obtained a chest x-ray. Chest x-ray demonstrated a significant hydropneumothorax on the left side. The patient was referred urgently to the Emergency Room, where a chest tube was placed under the guidance of Dr. Nestor Lewandowsky. The patient was admitted then to the surgical service.   In addition, the patient has a history of a recently-discovered thoracic aortic dissection, which is being followed at [facility]. In discussing this problem with the patient, she relates that there is a plan to perform some sort of surgical resection once her diagnosis on the left upper lobe mass is established. She also has underlying atrial fibrillation, recently having undergone an ablation. Her hospital course was largely unremarkable. Air leak stopped fairly quickly. On the second hospital day, she had no air leak and full expansion on chest x-ray. Chest tube was removed this morning. Followup x-ray demonstrates no evidence of any pneumothorax or extrapleural fluid. The patient is discharged home today to be followed in the office later this week at [facility].   DISCHARGE MEDICATIONS INCLUDE:  Metformin 1000 mg b.i.d., magnesium 400 mg b.i.d., warfarin 5 mg once a day, Lasix 40 mg once a day, Carvedilol 3.125 mg b.i.d., Lipitor 20 mg daily, levothyroxine 50 mcg daily and Vicodin 5/325 every 4 hours p.r.n. pain.   FINAL DISCHARGE DIAGNOSIS:  Iatrogenic hydropneumothorax.   SURGERY:  Tube  thoracostomy.   ____________________________ Micheline Maze, MD rle:mr D: 09/23/2012 15:09:57 ET T: 09/23/2012 18:23:15 ET JOB#: [id number]  cc: Micheline Maze, MD, <Dictator> Ocie Cornfield. Ouida Sills, MD Herbon E. Raul Del, [place] Genevive Bi, MD     Rodena Goldmann MD ELECTRONICALLY SIGNED 09/29/2012 16:28

## 2014-09-20 NOTE — Discharge Summary (Signed)
Dates of Admission and Diagnosis:  Date of Admission 26-Jul-2012   Date of Discharge 27-Jul-2012   Admitting Diagnosis rapid aflutter   Final Diagnosis same    Chief Complaint/History of Present Illness see h and p   Thyroid:  26-Feb-14 08:52   Thyroid Stimulating Hormone  0.367 (0.45-4.50 (International Unit)  ----------------------- Pregnant patients have  different reference  ranges for TSH:  - - - - - - - - - -  Pregnant, first trimetser:  0.36 - 2.50 uIU/mL)  Thyroxine, Free  1.55 (Result(s) reported on 26 Jul 2012 at 01:40PM.)  LabObservation:  27-Feb-14 14:06   OBSERVATION Reason for Test  Hepatic:  26-Feb-14 08:52   Bilirubin, Total 0.5  Alkaline Phosphatase 66  SGPT (ALT) 25  SGOT (AST) 29  Total Protein, Serum 7.2  Albumin, Serum  2.5  Cardiology:  26-Feb-14 08:48   Ventricular Rate 167  Atrial Rate 167  QRS Duration 84  QT 216  QTc 360  R Axis -53  T Axis 3  ECG interpretation Sinus tachycardia with occasional Premature ventricular complexes Left axis deviation Pulmonary disease pattern Inferior infarct , age undetermined Abnormal ECG When compared with ECG of 22-Jul-2012 01:14, Sinus rhythm has replaced Ectopic atrial rhythm Vent. rate has increased BY  94 BPM Inferior infarct is now Present Nonspecific T wave abnormality, worse in Lateral leads ----------unconfirmed---------- Confirmed by OVERREAD, NOT ([id number]), editor PEARSON, BARBARA ([id number]) on 07/27/2012 12:20:52 PM  P-R Interval 134  P Axis 80    09:14   Ventricular Rate 125  Atrial Rate 337  QRS Duration 74  QT 336  QTc 484  R Axis -32  T Axis 52  ECG interpretation Atrial flutter with variable A-V block Left axis deviation Inferior infarct (cited on or before 12-Oct-2004) *** ** ** ** * ACUTE MI  ** ** ** ** Abnormal ECG When compared with ECG of 26-Jul-2012 08:59, Significant changes have occurred ----------unconfirmed---------- Confirmed by OVERREAD, NOT (100), editor PEARSON,  BARBARA ([id number]) on 07/27/2012 12:20:57 PM    14:35   Echo Doppler REASON FOR EXAM:     COMMENTS:     PROCEDURE: [facility] - ECHO DOPPLER  - Jul 26 2012  2:35PM   RESULT: Transthoracic Echocardiogram Report  Patient Name:   Kristen [name] Date of Exam: 07/26/2012 Medical Rec #:  [id number]             Custom1: Date of Birth:  1941-03-01          Height:       63.0 in Patient Age:    74 years           Weight:       194.0 lb Patient Gender: F                  BSA:          1.91 m??  Indications: Atrial Fibrillation Sonographer:    Janalee Dane RCS Referring Phys: Gladstone Lighter  2D AND M-MODE MEASUREMENTS (normal ranges within parentheses): Left Ventricle:          Normal    Aorta/Left Atrium:    Normal IVSd (2D):      1.13 cm (0.7-1.1)  Aortic Root (2D):    (2.4-3.7) LVPWd (2D):     0.92 cm(0.7-1.1) AoV Cusp Separation:  (1.5-2.6) LVIDd (2D):     4.39 cm (3.4-5.7)  Left Atrium (2D):    (1.9-4.0) LVIDs (2D):     3.10 cm LV FS (2D):  29.4 %   (>25%) LV EF (2D):     56.5 %   (>50%)   Right Ventricle:    RVd (2D): SPECTRAL DOPPLER ANALYSIS (where applicable): Tricuspid Valve and PA/RV Systolic Pressure: TR Max Velocity:  RA  Pressure: 5 mmHg RVSP/PASP: PROCEDURE: After discussion of the risks and benefits of the TEE, an  informed consent was obtained. The TEE probe was passed without  difficulty. The patient's vital signs; including heart rate, blood  pressure, and oxygen saturation; remained stable throughout the  procedure. The patient tolerated the procedure well and without  complications.  PHYSICIAN INTERPRETATION: Left Ventricle: Normal left ventricular size and wall thicknesses, with  normal systolic and diastolic function. The left ventricular internal  cavity size was normal. LV posterior wall thickness was normal. Global LV  systolic function was hyperdynamic. Left ventricular ejection fraction,  by visual estimation, is 65 to 70%. Right Ventricle: Normal right  ventricular size, wall thickness, and  systolic function. Left Atrium: The left atrium is normal in size and structure. Right Atrium: The right atrium is normal in size and structure. Pericardium: There is no evidence of pericardial effusion. There is  pleural effusion in either the left or right lateral region. Mitral Valve: Structurally normal mitral valve, with normal leaflet  excursion; without any evidence of mitral stenosis or significant  regurgitation. Mild to moderate mitral valve regurgitation is seen. Tricuspid Valve: Structurally normal tricuspid valve, with normal leaflet  excursion. The tricuspid valve is normal. Mild tricuspid regurgitation is  visualized. Aortic Valve: The aortic valve is trileaflet and structurally normal,  with normal leaflet excursion; without any evidence of aortic stenosis or  insufficiency. Pulmonic Valve: Structurally normal pulmonic valve, with normal leaflet  excursion. Aorta: The aortic root and ascending aorta are structurally normal, with  no evidence of dilitation. Pulmonary Artery: The main pulmonary artery is normal in size, origin and  position; with normal bifurcation into the left and right pulmonary  arteries. Shunts: There is no evidence of a patent ductus arteriosus. There is no  evidence of a patent foramen ovale. No ventricular septal defect is seen  or detected. There is no evidence of an atrial septal defect.  Summary:  1. Left ventricular ejection fraction, by visual estimation, is 65 to  70%.  2. Hyperdynamic global left ventricular systolic function.  3. All cardiac valves are normal in structure and function.  4. Mild to moderate mitral valve regurgitation.  5. Mild tricuspid regurgitation.  6. There is no evidence of ischemic, primary valvular, hypertrophic, or  pulmonary heart disease. [id number] Serafina Royals MD Electronically signed by [id number] Serafina Royals MD Signature Date/Time: 07/27/2012/2:10:21 PM  *** Final  ***  IMPRESSION: .    Verified By: Corey Skains  ([facility]), M.D., MD  Routine Chem:  26-Feb-14 08:52   Glucose, Serum  174  BUN 14  Creatinine (comp)  0.54  Sodium, Serum 136  Potassium, Serum 3.8  Chloride, Serum 99  CO2, Serum 30  Calcium (Total), Serum 8.5  Anion Gap 7  Osmolality (calc) 277  eGFR (African American) >60  eGFR (Non-African American) >60 (eGFR values <40m/min/1.73 m2 may be an indication of chronic kidney disease (CKD). Calculated eGFR is useful in patients with stable renal function. The eGFR calculation will not be reliable in acutely ill patients when serum creatinine is changing rapidly. It is not useful in  patients on dialysis. The eGFR calculation may not be applicable to patients at the low and high extremes of body  sizes, pregnant women, and vegetarians.)  Hemoglobin A1c ([facility])  7.1 (The American Diabetes Association recommends that a primary goal of therapy should be <7% and that physicians should reevaluate the treatment regimen in patients with HbA1c values consistently >8%.)  Cholesterol, Serum 112  Triglycerides, Serum 72  HDL (INHOUSE)  32  VLDL Cholesterol Calculated 14  LDL Cholesterol Calculated 66 (Result(s) reported on 26 Jul 2012 at 01:28PM.)  Result Comment labs - LIS down -delayed results  Result(s) reported on 26 Jul 2012 at 03:17PM.  27-Feb-14 01:29   Glucose, Serum  144  BUN 15  Creatinine (comp) 0.61  Sodium, Serum 139  Potassium, Serum  3.2  Chloride, Serum 101  CO2, Serum 31  Calcium (Total), Serum  8.1  Anion Gap 7  Osmolality (calc) 281  eGFR (African American) >60  eGFR (Non-African American) >60 (eGFR values <61m/min/1.73 m2 may be an indication of chronic kidney disease (CKD). Calculated eGFR is useful in patients with stable renal function. The eGFR calculation will not be reliable in acutely ill patients when serum creatinine is changing rapidly. It is not useful in  patients on dialysis. The eGFR  calculation may not be applicable to patients at the low and high extremes of body sizes, pregnant women, and vegetarians.)  Magnesium, Serum 1.8 (1.8-2.4 THERAPEUTIC RANGE: 4-7 mg/dL TOXIC: > 10 mg/dL  -----------------------)  Cardiac:  26-Feb-14 08:52   CK, Total 88  CPK-MB, Serum  < 0.5 (Result(s) reported on 26 Jul 2012 at 09:46AM.)  Troponin I < 0.02 (0.00-0.05 0.05 ng/mL or less: NEGATIVE  Repeat testing in 3-6 hrs  if clinically indicated. >0.05 ng/mL: POTENTIAL  MYOCARDIAL INJURY. Repeat  testing in 3-6 hrs if  clinically indicated. NOTE: An increase or decrease  of 30% or more on serial  testing suggests a  clinically important change)    17:18   CK, Total 58  CPK-MB, Serum  < 0.5 (Result(s) reported on 26 Jul 2012 at 06:19PM.)  Troponin I < 0.02 (0.00-0.05 0.05 ng/mL or less: NEGATIVE  Repeat testing in 3-6 hrs  if clinically indicated. >0.05 ng/mL: POTENTIAL  MYOCARDIAL INJURY. Repeat  testing in 3-6 hrs if  clinically indicated. NOTE: An increase or decrease  of 30% or more on serial  testing suggests a  clinically important change)  27-Feb-14 01:29   CK, Total 53  CPK-MB, Serum  < 0.5 (Result(s) reported on 27 Jul 2012 at 02:29AM.)  Troponin I < 0.02 (0.00-0.05 0.05 ng/mL or less: NEGATIVE  Repeat testing in 3-6 hrs  if clinically indicated. >0.05 ng/mL: POTENTIAL  MYOCARDIAL INJURY. Repeat  testing in 3-6 hrs if  clinically indicated. NOTE: An increase or decrease  of 30% or more on serial  testing suggests a  clinically important change)  Routine UA:  26-Feb-14 16:53   Color (UA) Yellow  Clarity (UA) Hazy  Glucose (UA) Negative  Bilirubin (UA) Negative  Ketones (UA) Negative  Specific Gravity (UA) 1.012  Blood (UA) Negative  pH (UA) 7.0  Protein (UA) Negative  Nitrite (UA) Negative  Leukocyte Esterase (UA) Negative (Result(s) reported on 26 Jul 2012 at 05:44PM.)  RBC (UA) 2 /HPF  WBC (UA) 1 /HPF  Bacteria (UA) NONE SEEN  Epithelial  Cells (UA) 1 /HPF (Result(s) reported on 26 Jul 2012 at 05:44PM.)  Routine Coag:  26-Feb-14 08:52   Prothrombin  15.2  INR 1.2 (INR reference interval applies to patients on anticoagulant therapy. A single INR therapeutic range for coumarins is not optimal for all indications;  however, the suggested range for most indications is 2.0 - 3.0. Exceptions to the INR Reference Range may include: Prosthetic heart valves, acute myocardial infarction, prevention of myocardial infarction, and combinations of aspirin and anticoagulant. The need for a higher or lower target INR must be assessed individually. Reference: The Pharmacology and Management of the Vitamin K  antagonists: the seventh ACCP Conference on Antithrombotic and Thrombolytic Therapy. JQZES.9233 Sept:126 (3suppl): [id number]. A HCT value >55% may artifactually increase the PT.  In one study,  the increase was an average of 25%. Reference:  "Effect on Routine and Special Coagulation Testing Values of Citrate Anticoagulant Adjustment in Patients with High HCT Values." American Journal of Clinical Pathology 2006;126:400-405.)  Activated PTT (APTT) 29.8 (A HCT value >55% may artifactually increase the APTT. In one study, the increase was an average of 19%. Reference: "Effect on Routine and Special Coagulation Testing Values of Citrate Anticoagulant Adjustment in Patients with High HCT Values." American Journal of Clinical Pathology 2006;126:400-405.)  Routine Hem:  26-Feb-14 08:52   WBC (CBC)  13.0  RBC (CBC) 4.17  Hemoglobin (CBC) 13.7  Hematocrit (CBC) 41.1  Platelet Count (CBC) 236 (Result(s) reported on 26 Jul 2012 at 03:17PM.)  MCV 98  MCH 32.8  MCHC 33.4  RDW 14.5  27-Feb-14 01:29   WBC (CBC)  12.1  RBC (CBC) 3.83  Hemoglobin (CBC) 12.1  Hematocrit (CBC) 38.0  Platelet Count (CBC) 251  MCV 99  MCH 31.6  MCHC  31.9  RDW 14.4  Neutrophil % 78.2  Lymphocyte % 9.5  Monocyte % 11.1  Eosinophil % 0.8  Basophil % 0.4   Neutrophil #  9.5  Lymphocyte # 1.1  Monocyte #  1.3  Eosinophil # 0.1  Basophil # 0.1 (Result(s) reported on 27 Jul 2012 at 02:14AM.)   PERTINENT RADIOLOGY STUDIES: XRay:    22-Feb-14 02:04, Chest Portable Single View  Chest Portable Single View   REASON FOR EXAM:    syncope weakness  COMMENTS:       PROCEDURE: DXR - DXR PORTABLE CHEST SINGLE VIEW  - Jul 22 2012  2:04AM     RESULT: The lungs are adequately inflated. The cardiac silhouette is   enlarged. The pulmonary vascularity is not engorged. There is no pleural   effusion. There is tortuosity of the descending thoracic aorta.    IMPRESSION:  There is enlargement of the cardiac chambers without overt   evidence of CHF. There is no evidence of pneumonia.    A followup PA and lateral chest x-ray would be of value when the patient   can tolerate the procedure.   Dictation Site: 1        Verified By: DAVID A. Martinique, M.D., MD    26-Feb-14 09:08, Chest Portable Single View  Chest Portable Single View   REASON FOR EXAM:    Chest Pain  COMMENTS:       PROCEDURE: DXR - DXR PORTABLE CHEST SINGLE VIEW  - Jul 26 2012  9:08AM     RESULT: The cardiac silhouette is enlarged. There is pulmonary vascular   congestion without focal consolidation or effusion. The retrocardiac   region is poorly seen.    IMPRESSION:   1. Cardiomegaly.    Dictation Site: 2      Verified By: Sundra Aland, M.D., MD  LabUnknown:    09-Jan-14 12:42, Screening Digital Mammogram  PACS Image   CT:    22-Feb-14 01:58, CT Head Without Contrast  CT Head Without Contrast  REASON FOR EXAM:    syncope  COMMENTS:       PROCEDURE: CT  - CT HEAD WITHOUT CONTRAST  - Jul 22 2012  1:58AM     RESULT: Axial noncontrast CT scanning was performed through the brain   with reconstructions at 5 mm intervals and slice thicknesses.    There is mild diffuse cerebral and cerebellar atrophy consistent with the   patient's age. There is no intracranial  hemorrhage nor intracranial mass   effect or evidence of an evolving ischemic infarction. There are   decreased densities in the deep white matter of both cerebral hemispheres   and in the left basal ganglia consistent with the sequelae of chronic   small vessel ischemic type change. At bone window settings the observed   portions of the paranasal sinuses and mastoid air cells are clear. There     is no evidence of an acute skull fracture.    IMPRESSION:   1. There is no evidence of an acute intracranial hemorrhage nor other   evidence of acute intracranial injury.  2. There are white matter density changes consistent with chronic small   vessel ischemia.  3. There is no evidence of an acute skull fracture.    A preliminary report was sent to the emergency department at the   conclusion of the study.     Dictation Site: 1      Verified By: DAVID A. Martinique, M.D., MD   Hospital Course:  Hospital Course Improved hr, from 100-120 now, was 170. breathing better, ruled out. Dc with follow up in am as an outpatient to continue to titrate meds. she is stable so can do this, no cp etc   DISCHARGE INSTRUCTIONS HOME MEDS:  Medication Reconciliation: Patient's Home Medications at Discharge:     Medication Instructions  levothyroxine 50 mcg (0.05 mg) oral capsule  1 cap(s) orally once a day   hydrochlorothiazide-losartan 12.5 mg-100 mg oral tablet  1 tab(s) orally once a day   potassium chloride 10 meq oral capsule, extended release  1 cap(s) orally once a day   acetaminophen 325 mg oral tablet  2 tab(s) orally every 4 hours, As needed, pain or temp. greater than 100.4   amiodarone 400 mg oral tablet  1 tab(s) orally once a day   rivaroxaban 20 mg oral tablet  1 tab(s) orally once a day   metformin 500 mg oral tablet  1 tab(s) orally 2 times a day (with meals)   metoprolol succinate 200 mg oral tablet, extended release  1 tab(s) orally once a day     Physician's Instructions:  Diet  Carbohydrate Controlled (ADA) Diet   Activity Limitations As tolerated   Return to Work Not Applicable   Time frame for Follow Up Appointment am   Electronic Signatures: Kirk Ruths (MD)  (Signed 01-Mar-14 11:38)  Authored: ADMISSION DATE AND DIAGNOSIS, CHIEF COMPLAINT/HPI, PERTINENT LABS, PERTINENT RADIOLOGY STUDIES, HOSPITAL COURSE, DISCHARGE INSTRUCTIONS HOME MEDS, PATIENT INSTRUCTIONS   Last Updated: 01-Mar-14 11:38 by Kirk Ruths (MD)
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
## 2014-09-20 NOTE — Consult Note (Signed)
PATIENT NAME:  Kristen [name], Kristen [name] MR#:  [id number] DATE OF BIRTH:  [birth year]  DATE OF CONSULTATION:  09/21/2012  CONSULTING PHYSICIAN:  Fortune Brannigan E. Kiev Labrosse, MD  REASON FOR CONSULTATION: Hydropneumothorax, left side.   HISTORY OF PRESENT ILLNESS: I have personally seen and examined this patient. I discussed her care with Dr. Wallene Huh and Dr. Barbette Reichmann. She is a 74 year old African American woman with a long-standing history of tobacco use as well as cardiac disease, who was recently transferred to [facility] for management of atrial fibrillation with rapid ventricular response. She spent a considerable period of time at [facility] for management of her arrhythmias and was ultimately discharged to home. At some point during that hospital admission, she had a right upper lobe mass identified and underwent bronchoscopy, which according to the patient was negative. The patient then re-presented to [facility] earlier this week where she underwent a percutaneous left upper lobe biopsy. She presented to Dr. Alphonzo Lemmings clinic today complaining of shortness of breath. She was found to be hypoxic where a chest x-ray showed a left hydropneumothorax. A chest tube was inserted in the Emergency Department with the return of 500 to 600 mL of somewhat cloudy fluid. There was a small air leak present.   PAST MEDICAL HISTORY: Significant for significant underlying COPD and tobacco use. She also has a history of coronary artery disease and atrial arrhythmias.   SOCIAL HISTORY: She smokes and has smoked all of her adult life.   FAMILY HISTORY: There is no family history of any lung disease.   PHYSICAL EXAMINATION:  GENERAL: Revealed an elderly female who appeared in no acute distress.  LUNGS: She had markedly diminished breath sounds on the left. There was a Band-Aid over the anterior chest on the left side, which I presume is from her biopsy site. There was no surrounding erythema.  HEART: Her heart was regular  without murmurs.  EXTREMITIES: Without clubbing, cyanosis, or edema. She had good pulses throughout.   ASSESSMENT AND PLAN: The chest tube was inserted in the Emergency Department. A chest x-ray is pending. I would send the fluid for culture as well as for cytology. The chest tube should remain on suction at 20 cm of water until the etiology of her pleural effusion can be established.   ____________________________ Lew Dawes. Genevive Bi, MD teo:jm D: 09/21/2012 13:31:56 ET T: 09/21/2012 14:13:08 ET JOB#: [id number]  cc: Christia Reading E. Genevive Bi, MD, <Dictator> Louis Matte MD ELECTRONICALLY SIGNED 09/27/2012 7:36

## 2014-09-20 NOTE — Consult Note (Signed)
Brief Consult Note: Diagnosis: AFL, asymptomatic, CHADS2 2.   Patient was seen by consultant.   Consult note dictated.   Comments: REC  Agree with current therapy, agree wtih dabigatran, review echo, convert to po cardizem when HR better controlled.  Electronic Signatures: Isaias Cowman (MD)  (Signed 26-Feb-14 13:54)  Authored: Brief Consult Note   Last Updated: 26-Feb-14 13:54 by Isaias Cowman (MD)

## 2014-10-29 ENCOUNTER — Encounter: Payer: Self-pay | Admitting: Emergency Medicine

## 2014-10-29 ENCOUNTER — Inpatient Hospital Stay
Admission: EM | Admit: 2014-10-29 | Discharge: 2014-10-31 | DRG: 378 | Disposition: A | Discharge status: Home / Self Care | Payer: Commercial Managed Care - HMO | Attending: Internal Medicine | Admitting: Internal Medicine

## 2014-10-29 DIAGNOSIS — R58 Hemorrhage, not elsewhere classified: Secondary | ICD-10-CM

## 2014-10-29 DIAGNOSIS — D689 Coagulation defect, unspecified: Secondary | ICD-10-CM | POA: Diagnosis present

## 2014-10-29 DIAGNOSIS — K921 Melena: Principal | ICD-10-CM | POA: Diagnosis present

## 2014-10-29 DIAGNOSIS — K259 Gastric ulcer, unspecified as acute or chronic, without hemorrhage or perforation: Secondary | ICD-10-CM | POA: Diagnosis not present

## 2014-10-29 DIAGNOSIS — E785 Hyperlipidemia, unspecified: Secondary | ICD-10-CM | POA: Diagnosis present

## 2014-10-29 DIAGNOSIS — I10 Essential (primary) hypertension: Secondary | ICD-10-CM | POA: Diagnosis present

## 2014-10-29 DIAGNOSIS — K2970 Gastritis, unspecified, without bleeding: Secondary | ICD-10-CM | POA: Diagnosis not present

## 2014-10-29 DIAGNOSIS — Z87891 Personal history of nicotine dependence: Secondary | ICD-10-CM

## 2014-10-29 DIAGNOSIS — E039 Hypothyroidism, unspecified: Secondary | ICD-10-CM | POA: Diagnosis present

## 2014-10-29 DIAGNOSIS — K922 Gastrointestinal hemorrhage, unspecified: Secondary | ICD-10-CM | POA: Diagnosis present

## 2014-10-29 DIAGNOSIS — D649 Anemia, unspecified: Secondary | ICD-10-CM | POA: Diagnosis present

## 2014-10-29 DIAGNOSIS — I482 Chronic atrial fibrillation: Secondary | ICD-10-CM | POA: Diagnosis present

## 2014-10-29 DIAGNOSIS — E119 Type 2 diabetes mellitus without complications: Secondary | ICD-10-CM | POA: Diagnosis present

## 2014-10-29 DIAGNOSIS — Z85118 Personal history of other malignant neoplasm of bronchus and lung: Secondary | ICD-10-CM | POA: Diagnosis not present

## 2014-10-29 DIAGNOSIS — Z7901 Long term (current) use of anticoagulants: Secondary | ICD-10-CM | POA: Diagnosis not present

## 2014-10-29 DIAGNOSIS — Z8249 Family history of ischemic heart disease and other diseases of the circulatory system: Secondary | ICD-10-CM

## 2014-10-29 DIAGNOSIS — I9589 Other hypotension: Secondary | ICD-10-CM | POA: Diagnosis present

## 2014-10-29 DIAGNOSIS — Z801 Family history of malignant neoplasm of trachea, bronchus and lung: Secondary | ICD-10-CM

## 2014-10-29 HISTORY — DX: Disorder of thyroid, unspecified: E07.9

## 2014-10-29 HISTORY — DX: Type 2 diabetes mellitus without complications: E11.9

## 2014-10-29 HISTORY — DX: Hyperlipidemia, unspecified: E78.5

## 2014-10-29 HISTORY — DX: Essential (primary) hypertension: I10

## 2014-10-29 HISTORY — DX: Unspecified atrial fibrillation: I48.91

## 2014-10-29 HISTORY — DX: Malignant neoplasm of unspecified part of unspecified bronchus or lung: C34.90

## 2014-10-29 LAB — COMPREHENSIVE METABOLIC PANEL
ALBUMIN: 3.3 g/dL — AB (ref 3.5–5.0)
ALT: 15 U/L (ref 14–54)
AST: 22 U/L (ref 15–41)
Alkaline Phosphatase: 50 U/L (ref 38–126)
Anion gap: 11 (ref 5–15)
BILIRUBIN TOTAL: 0.3 mg/dL (ref 0.3–1.2)
BUN: 45 mg/dL — ABNORMAL HIGH (ref 6–20)
CALCIUM: 8.5 mg/dL — AB (ref 8.9–10.3)
CHLORIDE: 103 mmol/L (ref 101–111)
CO2: 26 mmol/L (ref 22–32)
Creatinine, Ser: 0.95 mg/dL (ref 0.44–1.00)
GFR calc Af Amer: >60 mL/min (ref >60)
GFR calc non Af Amer: 57 mL/min — ABNORMAL LOW (ref >60)
Glucose, Bld: 191 mg/dL — ABNORMAL HIGH (ref 65–99)
Potassium: 4.9 mmol/L (ref 3.5–5.1)
Sodium: 140 mmol/L (ref 135–145)
Total Protein: 6.7 g/dL (ref 6.5–8.1)

## 2014-10-29 LAB — CBC WITH DIFFERENTIAL/PLATELET
BASOS PCT: 0 %
Basophils Absolute: 0 10*3/uL (ref 0–0.1)
Eosinophils Absolute: 0 10*3/uL (ref 0–0.7)
Eosinophils Relative: 0 %
HCT: 34.3 % — ABNORMAL LOW (ref 35.0–47.0)
HEMOGLOBIN: 11.3 g/dL — AB (ref 12.0–16.0)
LYMPHS ABS: 1.1 10*3/uL (ref 1.0–3.6)
Lymphocytes Relative: 12 %
MCH: 32.7 pg (ref 26.0–34.0)
MCHC: 33.1 g/dL (ref 32.0–36.0)
MCV: 98.8 fL (ref 80.0–100.0)
MONOS PCT: 6 %
Monocytes Absolute: 0.5 10*3/uL (ref 0.2–0.9)
NEUTROS PCT: 82 %
Neutro Abs: 7.2 10*3/uL — ABNORMAL HIGH (ref 1.4–6.5)
Platelets: 186 10*3/uL (ref 150–440)
RBC: 3.47 MIL/uL — AB (ref 3.80–5.20)
RDW: 16.9 % — ABNORMAL HIGH (ref 11.5–14.5)
WBC: 8.8 10*3/uL (ref 3.6–11.0)

## 2014-10-29 LAB — TYPE AND SCREEN
ABO/RH(D): A POS
Antibody Screen: NEGATIVE

## 2014-10-29 LAB — PROTIME-INR
INR: 2.02
PROTHROMBIN TIME: 23 s — AB (ref 11.4–15.0)

## 2014-10-29 LAB — HEMOGLOBIN: HEMOGLOBIN: 11.1 g/dL — AB (ref 12.0–16.0)

## 2014-10-29 LAB — ABO/RH: ABO/RH(D): A POS

## 2014-10-29 MED ORDER — PANTOPRAZOLE SODIUM 40 MG IV SOLR
INTRAVENOUS | Status: AC
  Administered 2014-10-29: 40 mg via INTRAVENOUS
  Filled 2014-10-29: qty 40

## 2014-10-29 MED ORDER — SODIUM CHLORIDE 0.9 % IV SOLN
INTRAVENOUS | Status: DC
  Administered 2014-10-29: 1000 mL via INTRAVENOUS
  Administered 2014-10-30 – 2014-10-31 (×2): via INTRAVENOUS

## 2014-10-29 MED ORDER — ATORVASTATIN CALCIUM 10 MG PO TABS
10.0000 mg | ORAL_TABLET | Freq: Every day | ORAL | Status: DC
  Administered 2014-10-29 – 2014-10-31 (×3): 10 mg via ORAL
  Filled 2014-10-29 (×3): qty 1

## 2014-10-29 MED ORDER — PANTOPRAZOLE SODIUM 40 MG IV SOLR
40.0000 mg | Freq: Two times a day (BID) | INTRAVENOUS | Status: DC
  Administered 2014-10-29 – 2014-10-31 (×4): 40 mg via INTRAVENOUS
  Filled 2014-10-29 (×4): qty 40

## 2014-10-29 MED ORDER — PHYTONADIONE 5 MG PO TABS
5.0000 mg | ORAL_TABLET | Freq: Once | ORAL | Status: AC
  Administered 2014-10-29: 23:00:00 5 mg via ORAL
  Filled 2014-10-29: qty 1

## 2014-10-29 MED ORDER — ACETAMINOPHEN 650 MG RE SUPP: 650.0000 mg | Freq: Four times a day (QID) | RECTAL | Status: DC | PRN

## 2014-10-29 MED ORDER — LEVOTHYROXINE SODIUM 50 MCG PO TABS
50.0000 ug | ORAL_TABLET | Freq: Every day | ORAL | Status: DC
  Administered 2014-10-30 – 2014-10-31 (×2): 50 ug via ORAL
  Filled 2014-10-29 (×2): qty 1

## 2014-10-29 MED ORDER — ACETAMINOPHEN 325 MG PO TABS: 650.0000 mg | ORAL_TABLET | Freq: Four times a day (QID) | ORAL | Status: DC | PRN

## 2014-10-29 NOTE — ED Provider Notes (Signed)
[facility] Emergency Department Provider Note  ____________________________________________  Time seen: 1650  I have reviewed the triage vital signs and the nursing notes.   HISTORY  Chief Complaint GI Bleeding  weakness  HPI Kristen [name] is a 74 y.o. female who saw her primary physician, Dr. Ouida Sills, today. She was complaining of weakness with GI bleeding that started last night. She describes multiple bowel movements with dark red blood. There is no black blood. She has not had a problem with this before. Her last colonoscopy was 10 years ago. She was apparently hypotensive at [facility] clinic and was sent to the emergency department. She arrives here with a blood pressure of 92/68. She reports she is feeling much better at the moment.  She is not having any abdominal pain area. She is on Coumadin due to having "thick blood", and heart problems. She has a history of atrial fibrillation.    Past Medical History  Diagnosis Date  . Atrial fibrillation   . Thyroid disease   . Diabetes mellitus without complication   . Hypertension   . Hyperlipidemia     There are no active problems to display for this patient.   History reviewed. No pertinent past surgical history.  Current Outpatient Rx  Name  Route  Sig  Dispense  Refill  . atorvastatin (LIPITOR) 10 MG tablet   Oral   Take 10 mg by mouth daily.         . carvedilol (COREG) 3.125 MG tablet   Oral   Take 3.125 mg by mouth 2 (two) times daily.         . Cholecalciferol (VITAMIN D) 2000 UNITS CAPS   Oral   Take 1 capsule by mouth daily.         . furosemide (LASIX) 40 MG tablet   Oral   Take 40 mg by mouth daily.         Marland Kitchen levothyroxine (SYNTHROID, LEVOTHROID) 50 MCG tablet   Oral   Take 50 mcg by mouth daily.         Marland Kitchen losartan (COZAAR) 50 MG tablet   Oral   Take 50 mg by mouth daily.         . metFORMIN (GLUMETZA) 500 MG (MOD) 24 hr tablet   Oral   Take 2,000 mg by  mouth 2 (two) times daily. 2 tablets every morning and 2 tablets every evening         . warfarin (COUMADIN) 4 MG tablet   Oral   Take 6 mg by mouth daily. tk 1 and 1/2 tablet daily           Allergies Aleve and Celebrex  Family History  Problem Relation Age of Onset  . Heart attack Mother   . Cancer Father     Social History History  Substance Use Topics  . Smoking status: Never Smoker   . Smokeless tobacco: Never Used  . Alcohol Use: No    Review of Systems  Constitutional: Negative for fever. ENT: Negative for sore throat. Cardiovascular: Negative for chest pain. Respiratory: Negative for shortness of breath. Gastrointestinal: Positive for rectal bleeding. See history of present illness Genitourinary: Negative for dysuria. Musculoskeletal: Negative for back pain. Skin: Negative for rash. Neurological: Negative for headaches   10-point ROS otherwise negative.  ____________________________________________   PHYSICAL EXAM:  VITAL SIGNS: ED Triage Vitals  Enc Vitals Group     BP 10/29/14 1557 92/68 mmHg     Pulse Rate  10/29/14 1557 60     Resp 10/29/14 1557 18     Temp 10/29/14 1557 97 F (36.1 C)     Temp Source 10/29/14 1557 Oral     SpO2 10/29/14 1557 95 %     Weight 10/29/14 1557 190 lb (86.183 kg)     Height 10/29/14 1557 '5\' 3"'$  (1.6 m)     Head Cir --      Peak Flow --      Pain Score 10/29/14 1559 0     Pain Loc --      Pain Edu? --      Excl. in [place]? --     Constitutional:  Alert and oriented. Well appearing and in no distress. ENT   Head: Normocephalic and atraumatic.   Nose: No congestion/rhinnorhea.   Mouth/Throat: Mucous membranes are moist. Cardiovascular: Rate of approximately 60. Atrial fibrillation. Respiratory: Normal respiratory effort without tachypnea. Breath sounds are clear and equal bilaterally. No wheezes/rales/rhonchi. Gastrointestinal: Soft and nontender. No distention.  Back: No muscle spasm, no tenderness,  no CVA tenderness. Musculoskeletal: Nontender with normal range of motion in all extremities.  No noted edema. Neurologic:  Normal speech and language. No gross focal neurologic deficits are appreciated.  Skin:  Skin is warm, dry. No rash noted. Psychiatric: Mood and affect are normal. Speech and behavior are normal.  ____________________________________________    LABS (pertinent positives/negatives)  Blood cell 8.8, hemoglobin 11.3 At about panel potassium and sodium are within normal limits. Glucose 191, BUN of 45. INR of 2.0.   ____________________________________________   EKG  ED ECG REPORT I, Maxfield Gildersleeve W, the attending physician, personally viewed and interpreted this ECG.   Date: 10/29/2014  EKG Time: 1613  Rate: 59  Rhythm: Small P waves appreciated., Sinus  Axis: -39, left axis deviation  Intervals: Within normal limits.  ST&T Change: Flipped T waves in V4 through V6 and lead 3.   ____________________________________________  CRITICAL CARE Performed by: Ahmed Prima   Total critical care time: 30 minutes due to the critical condition of this patient with hypotension and active GI bleed.  Critical care time was exclusive of separately billable procedures and treating other patients.  Critical care was necessary to treat or prevent imminent or life-threatening deterioration.  Critical care was time spent personally by me on the following activities: development of treatment plan with patient and/or surrogate as well as nursing, discussions with consultants, evaluation of patient's response to treatment, examination of patient, obtaining history from patient or surrogate, ordering and performing treatments and interventions, ordering and review of laboratory studies, ordering and review of radiographic studies, pulse oximetry and re-evaluation of patient's condition.  ____________________________________________   INITIAL IMPRESSION / ASSESSMENT AND  PLAN / ED COURSE  Active GI bleed with some hypotension prior to arrival. [facility] admit her to the hospital for ongoing care and likely colonoscopy.  ____________________________________________   FINAL CLINICAL IMPRESSION(S) / ED DIAGNOSES  Final diagnoses:  Gastrointestinal hemorrhage with melena  Hypotension due to blood loss      Ahmed Prima, MD 10/29/14 1757

## 2014-10-29 NOTE — ED Notes (Signed)
Pt to ed from mds office for low blood pressure and GI bleeding since last night, pt states every BM since last night has been dark red in color.  Pt reports feeling very weak.

## 2014-10-29 NOTE — H&P (Signed)
[place] [facility] [place] NAME: Kristen [name]    MR#:  [id number]  DATE OF BIRTH:  10-23-1940  DATE OF ADMISSION:  10/29/2014  PRIMARY CARE PHYSICIAN: Kirk Ruths., MD   REQUESTING/REFERRING PHYSICIAN: Francene Castle  CHIEF COMPLAINT:   Chief Complaint  Patient presents with  . GI Bleeding    HISTORY OF PRESENT ILLNESS:  Kristen [name]  is a 74 y.o. female with a known history of atrial fibrillation on Coumadin. This morning at 5 AM she went to the bathroom she noticed black stool and dark red blood all over the toilet estimated at least a cup full of blood. Then again at around 7:30 AM she had another bowel movement which was black and also dark red blood was seen. This was less blood than the 5 AM episode. Her INR is therapeutic at 2. She did have a hot sweating feeling with these episodes. In the ER her blood pressure was low and she felt lightheaded. No complaints of chest pain or shortness of breath. In the ER her first hemoglobin was 11.3. Hospitalist services were contacted for further evaluation.  PAST MEDICAL HISTORY:   Past Medical History  Diagnosis Date  . Atrial fibrillation   . Thyroid disease   . Diabetes mellitus without complication   . Hypertension   . Hyperlipidemia   . Lung cancer     PAST SURGICAL HISTORY:   Past Surgical History  Procedure Laterality Date  . Left knee surgery Left   . Lung biopsy      SOCIAL HISTORY:   History  Substance Use Topics  . Smoking status: Never Smoker   . Smokeless tobacco: Never Used  . Alcohol Use: No    FAMILY HISTORY:   Family History  Problem Relation Age of Onset  . Heart attack Mother   . Lung cancer Father     DRUG ALLERGIES:   Allergies  Allergen Reactions  . Aleve [Naproxen Sodium]     Pt states can tolerate some, but causes GI upset  . Celebrex [Celecoxib] Other (See Comments)    Pt states can handle some, but causes GI upset    REVIEW OF  SYSTEMS:  CONSTITUTIONAL: No fever, leg weakness. Hot sweaty feeling. EYES: No blurred or double vision. Wears glasses. EARS, NOSE, AND THROAT: No tinnitus or ear pain. No sore throat RESPIRATORY: No cough, shortness of breath, wheezing or hemoptysis.  CARDIOVASCULAR: No chest pain, orthopnea, edema.  GASTROINTESTINAL: Positive for nausea, no vomiting, or abdominal pain. Black stool with dark red blood GENITOURINARY: No dysuria, hematuria.  ENDOCRINE: No polyuria, nocturia,  HEMATOLOGY: No anemia, easy bruising or bleeding SKIN: No rash or lesion. MUSCULOSKELETAL: No joint pain or arthritis.   NEUROLOGIC: No tingling, numbness, weakness.  PSYCHIATRY: No anxiety or depression.   MEDICATIONS AT HOME:   Prior to Admission medications   Medication Sig Start Date End Date Taking? Authorizing Provider  atorvastatin (LIPITOR) 10 MG tablet Take 10 mg by mouth daily.   Yes Historical Provider, MD  carvedilol (COREG) 3.125 MG tablet Take 3.125 mg by mouth 2 (two) times daily.   Yes Historical Provider, MD  Cholecalciferol (VITAMIN D) 2000 UNITS CAPS Take 1 capsule by mouth daily.   Yes Historical Provider, MD  furosemide (LASIX) 40 MG tablet Take 40 mg by mouth daily.   Yes Historical Provider, MD  levothyroxine (SYNTHROID, LEVOTHROID) 50 MCG tablet Take 50 mcg by mouth daily.   Yes Historical Provider, MD  losartan (COZAAR) 50 MG tablet Take 50 mg by mouth daily.   Yes Historical Provider, MD  metFORMIN (GLUMETZA) 500 MG (MOD) 24 hr tablet Take 2,000 mg by mouth 2 (two) times daily. 2 tablets every morning and 2 tablets every evening   Yes Historical Provider, MD  warfarin (COUMADIN) 4 MG tablet Take 6 mg by mouth daily. tk 1 and 1/2 tablet daily   Yes Historical Provider, MD      VITAL SIGNS:  Blood pressure 95/68, pulse 61, temperature 97 F (36.1 C), temperature source Oral, resp. rate 19, height '5\' 3"'$  (1.6 m), weight 86.183 kg (190 lb), SpO2 94 %.  PHYSICAL EXAMINATION:  GENERAL:  74  y.o.-year-old patient lying in the bed with no acute distress.  EYES: Pupils equal, round, reactive to light and accommodation. No scleral icterus. Extraocular muscles intact.  HEENT: Head atraumatic, normocephalic. Oropharynx and nasopharynx clear.  NECK:  Supple, no jugular venous distention. No thyroid enlargement, no tenderness.  LUNGS: Normal breath sounds bilaterally, no wheezing, rales,rhonchi or crepitation. No use of accessory muscles of respiration.  CARDIOVASCULAR: S1, S2 normal. No rubs, or gallops. 2/6 systolic ejection murmur ABDOMEN: Soft, nontender, nondistended. Bowel sounds present. No organomegaly or mass.  EXTREMITIES: 1+ edema, no cyanosis, or clubbing.  NEUROLOGIC: Cranial nerves II through XII are intact. Muscle strength 5/5 in all extremities. Sensation intact. Gait not checked.  PSYCHIATRIC: The patient is alert and oriented x 3.  SKIN: No rash, lesion, or ulcer.   LABORATORY PANEL:   CBC  Recent Labs Lab 10/29/14 1623  WBC 8.8  HGB 11.3*  HCT 34.3*  PLT 186   Chemistries   Recent Labs Lab 10/29/14 1623  NA 140  K 4.9  CL 103  CO2 26  GLUCOSE 191*  BUN 45*  CREATININE 0.95  CALCIUM 8.5*  AST 22  ALT 15  ALKPHOS 50  BILITOT 0.3    RADIOLOGY:  No results found.  EKG:  Ectopic atrial rhythm left axis deviation foot T waves laterally  IMPRESSION AND PLAN:   1. Acute gastrointestinal hemorrhage with hypotension. I will get serial hemoglobins. Start on IV Protonix. We'll put on clear liquid diet today and nothing by mouth for tomorrow. GI consultation. I will stop Coumadin and give 1 dose of vitamin K and check an INR in the a.m. Unclear whether this is an upper GI bleed or lower GI bleed with a black stool and dark red blood. I did consent the patient for transfusion but no indications right at this point. I will give gentle IV fluid and hold blood pressure medications. Nothing by mouth after midnight. 2. Hypotension: Hold blood pressure  medications and give gentle IV fluid hydration. 3. Hypothyroidism unspecified continue levothyroxine. 4. Hyperlipidemia unspecified continue atorvastatin. 5. History of atrial fibrillation; risk of stroke is higher off Coumadin. With the GI bleed I will have to hold Coumadin at this point. I'm also holding the low-dose Coreg at this point. 6. Type 2 diabetes; hold metformin at this time, sliding scale coverage ordered.  All the records are reviewed and case discussed with ED provider. Management plans discussed with the patient, and she is in agreement.  CODE STATUS: Full code  TOTAL TIME TAKING CARE OF THIS PATIENT: 55 minutes.    Loletha Grayer M.D on 10/29/2014 at 6:42 PM  Between 7am to 6pm - Pager - [phone]  After 6pm call admission pager [facility]  Office  [phone]  CC: Primary care physician; Kirk Ruths.,  MD

## 2014-10-30 ENCOUNTER — Encounter: Payer: Self-pay | Admitting: Urgent Care

## 2014-10-30 DIAGNOSIS — K921 Melena: Secondary | ICD-10-CM

## 2014-10-30 DIAGNOSIS — D689 Coagulation defect, unspecified: Secondary | ICD-10-CM

## 2014-10-30 DIAGNOSIS — D649 Anemia, unspecified: Secondary | ICD-10-CM

## 2014-10-30 LAB — BASIC METABOLIC PANEL
Anion gap: 6 (ref 5–15)
BUN: 40 mg/dL — ABNORMAL HIGH (ref 6–20)
CALCIUM: 8.3 mg/dL — AB (ref 8.9–10.3)
CO2: 26 mmol/L (ref 22–32)
Chloride: 108 mmol/L (ref 101–111)
Creatinine, Ser: 0.65 mg/dL (ref 0.44–1.00)
GFR calc Af Amer: >60 mL/min (ref >60)
GFR calc non Af Amer: >60 mL/min (ref >60)
Glucose, Bld: 149 mg/dL — ABNORMAL HIGH (ref 65–99)
Potassium: 3.9 mmol/L (ref 3.5–5.1)
Sodium: 140 mmol/L (ref 135–145)

## 2014-10-30 LAB — PROTIME-INR
INR: 1.82
Prothrombin Time: 21.2 seconds — ABNORMAL HIGH (ref 11.4–15.0)

## 2014-10-30 LAB — CBC
HCT: 30.2 % — ABNORMAL LOW (ref 35.0–47.0)
HEMOGLOBIN: 10.2 g/dL — AB (ref 12.0–16.0)
MCH: 33.3 pg (ref 26.0–34.0)
MCHC: 33.7 g/dL (ref 32.0–36.0)
MCV: 98.7 fL (ref 80.0–100.0)
Platelets: 155 10*3/uL (ref 150–440)
RBC: 3.06 MIL/uL — ABNORMAL LOW (ref 3.80–5.20)
RDW: 16.8 % — ABNORMAL HIGH (ref 11.5–14.5)
WBC: 8.5 10*3/uL (ref 3.6–11.0)

## 2014-10-30 LAB — HEMOGLOBIN
HEMOGLOBIN: 9.4 g/dL — AB (ref 12.0–16.0)
HEMOGLOBIN: 9.5 g/dL — AB (ref 12.0–16.0)

## 2014-10-30 NOTE — Plan of Care (Addendum)
Problem: Discharge Progression Outcomes Goal: Discharge plan in place and appropriate Individualization of care: - Likes to be called Kateria - Lives at home with son. - On moderate fall precautions per policy. - Has history of atrial fibrillation, thyroid disease, diabetes, hypertension, hyperlipidema  Controlled by medications. - Has history of lung Ca. Goal: Other Discharge Outcomes/Goals Plan of care progress to goal for: GI bleed - No BM this shift, smear of dark red blood when wiped. - VSS. - No complaints of pain. - NPO maintained ofter MN. - IV fluids continued.

## 2014-10-30 NOTE — Treatment Plan (Signed)
Notified Pia Mau RN about HR

## 2014-10-30 NOTE — Progress Notes (Signed)
Took over pt care at 1630, pt alert and oriented, calm and cooperative with care, tolerates clear liquid diet, NPO after midnight for EGD tomorrow, pt with no noted complaints, voices any needs

## 2014-10-30 NOTE — Treatment Plan (Signed)
Noted patient's heart rate to be 55. Talked with patient. Skin warm, dry. A/O. Pulse oximeter probe in place. Noted patient wearing finger nail polish. No complaints of shortness of breath. Discussed with Harrietta Guardian.

## 2014-10-30 NOTE — Care Management (Signed)
Admitted to [facility] with the diagnosis of GI Bleed. Lives with family. Sister is Chriss Driver [phone]) Dr. Ouida Sills is primary care physician. Seen in his office yesterday.  Hgb 10.2. NPO GI consult.  Shelbie Ammons RN MSN Care Management [phone]

## 2014-10-30 NOTE — Consult Note (Signed)
Gastroenterology Consultation  Referring Provider:     No ref. provider found Primary Care Physician:  Kirk Ruths., MD Primary Gastroenterologist:  Dr. Vira Agar (not seen in over 5 yrs)        Cardiologist:  Dr Ubaldo Glassing Reason for Consultation:   GI Bleed   Date of Admission:  10/29/2014 Date of Consultation:  10/30/2014         HPI:   Kristen [name] is a 74 y.o. female admitted with melena.  She got up yesterday morning & went to the bathroom & saw a large amount of melena.  She has one more episode before she came to hospital.  Hgb 11.3 in ER.   Denies heartburn, indigestion, nausea, vomiting, dysphagia, odynophagia or anorexia.  Denies constipation, diarrhea, rectal bleeding, melena or weight loss.  Pt takes a stool softener daily at home.  Patient has hx of atrial fibrillation on coumadin for past 2 years.  Pt is on pantoprazole '40mg'$  IV BID.  Vit K was ordered.  Denies NSAIDS except ASA '81mg'$  daily.    Past Medical History  Diagnosis Date  . Atrial fibrillation   . Thyroid disease   . Diabetes mellitus without complication   . Hypertension   . Hyperlipidemia   . Lung cancer 2015    Dr Kathrene Alu    Past Surgical History  Procedure Laterality Date  . Left knee surgery Left   . Lung biopsy    . Colonoscopy  2006    Dr Raynelle Fanning patient ?polyps    Prior to Admission medications   Medication Sig Start Date End Date Taking? Authorizing Provider  atorvastatin (LIPITOR) 10 MG tablet Take 10 mg by mouth daily.   Yes Historical Provider, MD  carvedilol (COREG) 3.125 MG tablet Take 3.125 mg by mouth 2 (two) times daily.   Yes Historical Provider, MD  Cholecalciferol (VITAMIN D) 2000 UNITS CAPS Take 1 capsule by mouth daily.   Yes Historical Provider, MD  furosemide (LASIX) 40 MG tablet Take 40 mg by mouth daily.   Yes Historical Provider, MD  levothyroxine (SYNTHROID, LEVOTHROID) 50 MCG tablet Take 50 mcg by mouth daily.   Yes Historical Provider, MD  losartan (COZAAR)  50 MG tablet Take 50 mg by mouth daily.   Yes Historical Provider, MD  metFORMIN (GLUMETZA) 500 MG (MOD) 24 hr tablet Take 2,000 mg by mouth 2 (two) times daily. 2 tablets every morning and 2 tablets every evening   Yes Historical Provider, MD  warfarin (COUMADIN) 4 MG tablet Take 6 mg by mouth daily. tk 1 and 1/2 tablet daily   Yes Historical Provider, MD    Family History  Problem Relation Age of Onset  . Heart attack Mother   . Lung cancer Father   . Heart attack Son 33  There is no known family history of colorectal carcinoma , liver disease, or inflammatory bowel disease.   History  Substance Use Topics  . Smoking status: Former Smoker    Quit date: 10/29/2013  . Smokeless tobacco: Never Used  . Alcohol Use: No    Allergies as of 10/29/2014 - Review Complete 10/29/2014  Allergen Reaction Noted  . Aleve [naproxen sodium]  10/29/2014  . Celebrex [celecoxib] Other (See Comments) 10/29/2014    Review of Systems:    All systems reviewed and negative except where noted in HPI.   Physical Exam:  Vital signs in last 24 hours: Temp:  [97 F (36.1 C)-98.9 F (37.2 C)] 98.1 F (36.7 C) (  06/01 1345) Pulse Rate:  [53-92] 53 (06/01 1345) Resp:  [11-23] 18 (06/01 1345) BP: (88-150)/(47-75) 127/61 mmHg (06/01 1345) SpO2:  [94 %-97 %] 95 % (06/01 1345) Weight:  [85.684 kg (188 lb 14.4 oz)-86.183 kg (190 lb)] 85.684 kg (188 lb 14.4 oz) (05/31 2012) Last BM Date: 10/29/14 General:   Alert,  Well-developed, obese, pleasant and cooperative in NAD, accompanied by her daughter Head:  Normocephalic and atraumatic. Eyes:  Sclera clear, no icterus.   Conjunctiva pink. Ears:  Normal auditory acuity. Nose:  No deformity, discharge, or lesions. Mouth:  No deformity or lesions,oropharynx pink & moist. Neck:  Supple; no masses or thyromegaly. Lungs:  Respirations even and unlabored.  Clear throughout to auscultation.   No wheezes, crackles, or rhonchi. No acute distress. Heart:  Regular rate  and rhythm; no murmurs, clicks, rubs, or gallops. Abdomen:   Obese Normal bowel sounds.  No bruits.  Soft, non-tender and non-distended without masses, hepatosplenomegaly or hernias noted.  No guarding or rebound tenderness.  .   Rectal:  Deferred.  Msk:  Symmetrical without gross deformities.  Good, equal movement & strength bilaterally. Pulses:  Normal pulses noted. Extremities:  No clubbing or edema.  No cyanosis. Neurologic:  Alert and oriented x3;  grossly normal neurologically. Skin:  Intact without significant lesions or rashes.  No jaundice. Lymph Nodes:  No significant cervical adenopathy. Psych:  Alert and cooperative. Normal mood and affect.  LAB RESULTS:  Recent Labs  10/29/14 1623 10/29/14 2015 10/30/14 0425 10/30/14 1234  WBC 8.8  --  8.5  --   HGB 11.3* 11.1* 10.2* 9.4*  HCT 34.3*  --  30.2*  --   PLT 186  --  155  --    BMET  Recent Labs  10/29/14 1623 10/30/14 0425  NA 140 140  K 4.9 3.9  CL 103 108  CO2 26 26  GLUCOSE 191* 149*  BUN 45* 40*  CREATININE 0.95 0.65  CALCIUM 8.5* 8.3*   LFT  Recent Labs  10/29/14 1623  PROT 6.7  ALBUMIN 3.3*  AST 22  ALT 15  ALKPHOS 50  BILITOT 0.3   PT/INR  Recent Labs  10/29/14 1623 10/30/14 0425  LABPROT 23.0* 21.2*  INR 2.02 1.82    STUDIES: No results found.    Impression / Plan:   Kristen [name] is a 74 y.o. y/o female with melena while on Coumadin for atrial fibrillation.  She has a mild coagulopathy and has been given vitamin K. We will recheck her INR in the morning. If her INR is less than 1.5, she will need EGD to evaluate her upper GI tract and look for source of upper GI bleeding including peptic ulcer disease, AVM, or Cameron's lesion.  I have discussed risks & benefits which include, but are not limited to, bleeding, infection, perforation & drug reaction.  The patient agrees with this plan & written consent will be obtained.    As a side note, she is overdue for colonoscopy and  that can be done as an outpatient basis unless nothing is found in the upper GI tract to explain her GI bleeding.  Plan: #1  Pantoprazole '40mg'$  BID #2  clear liquid diet today #3 nothing by mouth after midnight #4 recheck INR and H&H in the morning #5 EGD with Dr. Allen Norris in near future #6 hold Coumadin until endoscopic evaluation is complete  Thank you for involving me in the care of this patient.      LOS:  1 day   Vickey Huger, NP  10/30/2014, 3:32 PM [facility]  [place],  [id number] Phone: [phone] Fax : [phone]

## 2014-10-30 NOTE — Progress Notes (Signed)
[facility] [facility] [place] NAME: Kristen [name]    MR#:  [id number]  DATE OF BIRTH:  [birth year]  SUBJECTIVE:  CHIEF COMPLAINT:   Chief Complaint  Patient presents with  . GI Bleeding   Doing well this morning. No further bleeding noted, though hemoglobin is trending downwards. Blood pressure stable  REVIEW OF SYSTEMS:   Review of Systems  Constitutional: Negative for fever.  Respiratory: Negative for shortness of breath.   Cardiovascular: Negative for chest pain and palpitations.  Gastrointestinal: Positive for blood in stool. Negative for nausea, vomiting and abdominal pain.  Genitourinary: Negative for dysuria.    DRUG ALLERGIES:   Allergies  Allergen Reactions  . Aleve [Naproxen Sodium]     Pt states can tolerate some, but causes GI upset  . Celebrex [Celecoxib] Other (See Comments)    Pt states can handle some, but causes GI upset    VITALS:  Blood pressure 128/47, pulse 55, temperature 98.9 F (37.2 C), temperature source Oral, resp. rate 18, height '5\' 3"'$  (1.6 m), weight 85.684 kg (188 lb 14.4 oz), SpO2 96 %.  PHYSICAL EXAMINATION:  GENERAL:  74 y.o.-year-old patient lying in the bed with no acute distress. Obese EYES: Pupils equal, round, reactive to light and accommodation. No scleral icterus. Extraocular muscles intact.  HEENT: Head atraumatic, normocephalic. Oropharynx and nasopharynx clear.  NECK:  Supple, no jugular venous distention. No thyroid enlargement, no tenderness.  LUNGS: Normal breath sounds bilaterally, no wheezing, rales,rhonchi or crepitation. No use of accessory muscles of respiration.  CARDIOVASCULAR: S1, S2 normal. No murmurs, rubs, or gallops.  ABDOMEN: Soft, nontender, nondistended. Bowel sounds present. No organomegaly or mass.  EXTREMITIES: +1 pedal edema, no cyanosis, or clubbing.  NEUROLOGIC: Cranial nerves II through XII are intact. Muscle strength 5/5 in all extremities. Sensation intact. Gait  not checked.  PSYCHIATRIC: The patient is alert and oriented x 3.  SKIN: No obvious rash, lesion, or ulcer.    LABORATORY PANEL:   CBC  Recent Labs Lab 10/30/14 0425 10/30/14 1234  WBC 8.5  --   HGB 10.2* 9.4*  HCT 30.2*  --   PLT 155  --    ------------------------------------------------------------------------------------------------------------------  Chemistries   Recent Labs Lab 10/29/14 1623 10/30/14 0425  NA 140 140  K 4.9 3.9  CL 103 108  CO2 26 26  GLUCOSE 191* 149*  BUN 45* 40*  CREATININE 0.95 0.65  CALCIUM 8.5* 8.3*  AST 22  --   ALT 15  --   ALKPHOS 50  --   BILITOT 0.3  --    ------------------------------------------------------------------------------------------------------------------  Cardiac Enzymes No results for input(s): TROPONINI in the last 168 hours. ------------------------------------------------------------------------------------------------------------------  RADIOLOGY:  No results found.  EKG:   Orders placed or performed during the hospital encounter of 10/29/14  . EKG 12-Lead  . EKG 12-Lead    ASSESSMENT AND PLAN:   Active Problems:   GI bleed  Problem #1 acute gastrointestinal hemorrhage initially with hypotension: No further bleeding noted. Blood pressure stable. Hemoglobin continuing to trend downwards. She is on IV Protonix 40 mg twice a day. Coumadin held. Current INR is 1.8 after 1 dose vitamin K. He is comfortable with no abdominal pain. Awaiting GI consultation. Nothing by mouth.  #2 hypotension: Blood pressure has been improving, even higher earlier this morning. She is on losartan, carvedilol and Lasix at home which we are holding due to initial hypotension. Continue with gentle hydration. If blood pressure remains stable we  will restart home meds  #3 hypothyroidism: Continue levothyroxine  #4 hyperlipidemia: Continue statin  #5 atrial fibrillation: Coumadin is now held to bleeding Coreg held due to  hypotension. Continue to monitor carefully. Currently in sinus bradycardia.   All the records are reviewed and case discussed with Care Management/Social Workerr. Management plans discussed with the patient, family and they are in agreement.  CODE STATUS: Full  TOTAL TIME TAKING CARE OF THIS PATIENT: 35 minutes.   POSSIBLE D/C IN 2 DAYS, DEPENDING ON CLINICAL CONDITION.   Myrtis Ser M.D on 10/30/2014 at 12:59 PM  Between 7am to 6pm - Pager - [phone]  After 6pm go to [email or web address] - password [email or web address] [facility]  Office  [phone]  CC: Primary care physician; Kirk Ruths., MD

## 2014-10-30 NOTE — Plan of Care (Signed)
Problem: Discharge Progression Outcomes Goal: Other Discharge Outcomes/Goals Plan of Care Progress to Goal: No GI bleeding today. Tolerated being NPO but wanted something to eat. GI consult done. Changed to clear liquid diet until midnight. Tolerated clear liquids well. Remained in sinus bradycardia most of day with HR ranging in high 50's, low 60's. Kristen [name] made aware. Will continue to monitor since not symptomatic. BP stable. Hemoglobin dropped to 9.7.

## 2014-10-31 ENCOUNTER — Encounter: Payer: Self-pay | Admitting: *Deleted

## 2014-10-31 ENCOUNTER — Inpatient Hospital Stay: Payer: Commercial Managed Care - HMO | Admitting: Anesthesiology

## 2014-10-31 ENCOUNTER — Encounter
Admission: EM | Disposition: A | Discharge status: Home / Self Care | Payer: Self-pay | Source: Home / Self Care | Attending: Internal Medicine

## 2014-10-31 DIAGNOSIS — K259 Gastric ulcer, unspecified as acute or chronic, without hemorrhage or perforation: Secondary | ICD-10-CM

## 2014-10-31 DIAGNOSIS — K921 Melena: Secondary | ICD-10-CM

## 2014-10-31 DIAGNOSIS — K2970 Gastritis, unspecified, without bleeding: Secondary | ICD-10-CM

## 2014-10-31 LAB — HEMOGLOBIN
HEMOGLOBIN: 9 g/dL — AB (ref 12.0–16.0)
Hemoglobin: 9.6 g/dL — ABNORMAL LOW (ref 12.0–16.0)

## 2014-10-31 LAB — PROTIME-INR
INR: 1.29
Prothrombin Time: 16.3 seconds — ABNORMAL HIGH (ref 11.4–15.0)

## 2014-10-31 SURGERY — ESOPHAGOGASTRODUODENOSCOPY (EGD) WITH PROPOFOL
Anesthesia: General

## 2014-10-31 MED ORDER — PANTOPRAZOLE SODIUM 40 MG PO TBEC: 40.0000 mg | DELAYED_RELEASE_TABLET | Freq: Two times a day (BID) | ORAL | Status: DC

## 2014-10-31 MED ORDER — GLYCOPYRROLATE 0.2 MG/ML IJ SOLN
INTRAMUSCULAR | Status: DC | PRN
  Administered 2014-10-31: 0.2 mg via INTRAVENOUS

## 2014-10-31 MED ORDER — FENTANYL CITRATE (PF) 100 MCG/2ML IJ SOLN
INTRAMUSCULAR | Status: DC | PRN
  Administered 2014-10-31: 50 ug via INTRAVENOUS

## 2014-10-31 MED ORDER — MIDAZOLAM HCL 2 MG/2ML IJ SOLN
INTRAMUSCULAR | Status: DC | PRN
  Administered 2014-10-31: 1 mg via INTRAVENOUS

## 2014-10-31 MED ORDER — PROPOFOL 10 MG/ML IV BOLUS
INTRAVENOUS | Status: DC | PRN
  Administered 2014-10-31: 30 mg via INTRAVENOUS

## 2014-10-31 MED ORDER — PROPOFOL INFUSION 10 MG/ML OPTIME
INTRAVENOUS | Status: DC | PRN
  Administered 2014-10-31: 100 ug/kg/min via INTRAVENOUS

## 2014-10-31 NOTE — Plan of Care (Addendum)
Problem: Discharge Progression Outcomes Goal: Discharge plan in place and appropriate Individualization of care: - Likes to be called Dilynn - Lives at home with son. - On moderate fall precautions per policy. - Has history of atrial fibrillation, thyroid disease, diabetes, hypertension, hyperlipidema  Controlled by medications. - Has history of lung Ca. Goal: Other Discharge Outcomes/Goals Plan of care progress to goal for: GI bleed - No active bleeding this shift. - VSS. - No complaints of pain. - Scheduled for an EGD today, consent signed, NPO maintained ofter MN. - IV fluids continued.

## 2014-10-31 NOTE — Plan of Care (Signed)
Spoke via telephone with Dr. Volanda Napoleon. Discussed results of EGD with Dr. Volanda Napoleon. Discussion regarding patient being adamant about going home. Discharge orders received. IVF's discontinued. IV removed. Discharge paperwork provided, explained, signed and witnessed. Prescriptions given to patient. No unanswered questions. Discharged via wheelchair by auxiliary staff. Belongings sent with patient.

## 2014-10-31 NOTE — Anesthesia Postprocedure Evaluation (Signed)
  Anesthesia Post-op Note  Patient: Kristen [name]  Procedure(s) Performed: Procedure(s): ESOPHAGOGASTRODUODENOSCOPY (EGD) WITH PROPOFOL (N/A)  Anesthesia type:General  Patient location: PACU  Post pain: Pain level controlled  Post assessment: Post-op Vital signs reviewed, Patient's Cardiovascular Status Stable, Respiratory Function Stable, Patent Airway and No signs of Nausea or vomiting  Post vital signs: Reviewed and stable  Last Vitals:  Filed Vitals:   10/31/14 1430  BP:   Pulse: 53  Temp:   Resp: 16    Level of consciousness: awake, alert  and patient cooperative  Complications: No apparent anesthesia complications

## 2014-10-31 NOTE — Op Note (Signed)
[facility] Patient Name: Kristen [name] Procedure Date: 10/31/2014 1:42 PM MRN: [id number] Account #: [id number] Date of Birth: Sep 18, 1940 Admit Type: Inpatient Age: 74 Room: [facility] ENDO ROOM 4 Gender: Female Note Status: Finalized Procedure:         Upper GI endoscopy Indications:       Melena Providers:         Lucilla Lame, MD Referring MD:      Ocie Cornfield. Ouida Sills, MD (Referring MD) Medicines:         Propofol per Anesthesia Complications:     No immediate complications. Procedure:         Pre-Anesthesia Assessment:                    - Prior to the procedure, a History and Physical was                     performed, and patient medications and allergies were                     reviewed. The patient's tolerance of previous anesthesia                     was also reviewed. The risks and benefits of the procedure                     and the sedation options and risks were discussed with the                     patient. All questions were answered, and informed consent                     was obtained. Prior Anticoagulants: The patient has taken                     no previous anticoagulant or antiplatelet agents. ASA                     Grade Assessment: II - A patient with mild systemic                     disease. After reviewing the risks and benefits, the                     patient was deemed in satisfactory condition to undergo                     the procedure.                    After obtaining informed consent, the endoscope was passed                     under direct vision. Throughout the procedure, the                     patient's blood pressure, pulse, and oxygen saturations                     were monitored continuously. The Endoscope was introduced                     through the mouth, and advanced to the second part of  duodenum. The upper GI endoscopy was accomplished with                     ease. The patient  tolerated the procedure well. Findings:      The examined esophagus was normal.      One non-bleeding cratered gastric ulcer with no stigmata of bleeding was       found in the prepyloric region of the stomach.      Diffuse severe inflammation characterized by erosions was found in the       gastric antrum. Biopsies were taken with a cold forceps for histology.      The examined duodenum was normal. Impression:        - Normal esophagus.                    - Gastric ulcer with clean base.                    - Gastritis. Biopsied.                    - Normal examined duodenum. Recommendation:    - Await pathology results.                    - Use a proton pump inhibitor PO daily. Procedure Code(s): --- Professional ---                    [phone], Esophagogastroduodenoscopy, flexible, transoral;                     with biopsy, single or multiple Diagnosis Code(s): --- Professional ---                    K92.1, Melena                    K25.9, Gastric ulcer, unspecified as acute or chronic,                     without hemorrhage or perforation                    K29.70, Gastritis, unspecified, without bleeding CPT copyright 2014 American Medical Association. All rights reserved. The codes documented in this report are preliminary and upon coder review may  be revised to meet current compliance requirements. Lucilla Lame, MD 10/31/2014 1:55:51 PM This report has been signed electronically. Number of Addenda: 0 Note Initiated On: 10/31/2014 1:42 PM      [facility]

## 2014-10-31 NOTE — Anesthesia Preprocedure Evaluation (Addendum)
Anesthesia Evaluation  Patient identified by MRN, date of birth, ID band Patient awake    Reviewed: Allergy & Precautions, NPO status , Patient's Chart, lab work & pertinent test results, reviewed documented beta blocker date and time   History of Anesthesia Complications Negative for: history of anesthetic complications  Airway Mallampati: II  TM Distance: >3 FB Neck ROM: Full    Dental  (+) Chipped, Poor Dentition,    Pulmonary neg pulmonary ROS, former smoker,  breath sounds clear to auscultation  Pulmonary exam normal       Cardiovascular hypertension, Pt. on home beta blockers and Pt. on medications Normal cardiovascular exam+ dysrhythmias Atrial Fibrillation Rhythm:Regular Rate:Normal     Neuro/Psych negative neurological ROS  negative psych ROS   GI/Hepatic Neg liver ROS, GERD-  Medicated,  Endo/Other  diabetes, Well Controlled, Type 2, Oral Hypoglycemic AgentsHypothyroidism   Renal/GU negative Renal ROS  negative genitourinary   Musculoskeletal negative musculoskeletal ROS (+)   Abdominal   Peds negative pediatric ROS (+)  Hematology negative hematology ROS (+)   Anesthesia Other Findings   Reproductive/Obstetrics negative OB ROS                            Anesthesia Physical Anesthesia Plan  ASA: III  Anesthesia Plan: General   Post-op Pain Management:    Induction: Intravenous  Airway Management Planned: Nasal Cannula  Additional Equipment:   Intra-op Plan:   Post-operative Plan:   Informed Consent:   Plan Discussed with: CRNA and Surgeon  Anesthesia Plan Comments:         Anesthesia Quick Evaluation

## 2014-10-31 NOTE — Progress Notes (Signed)
   10/31/14 1100  Clinical Encounter Type  Visited With Patient and family together  Visit Type Initial  Visited with patient and her grandson.  Patient said she is feeling pretty good.  Said she arrived here on Tuesday and is hoping to be discharged tomorrow and go home.  Patient thanked me for my visit.  [place] [phone]

## 2014-10-31 NOTE — Clinical Documentation Improvement (Signed)
Possible Clinical Conditions?    Acute Blood Loss Anemia  Acute on chronic blood loss anemia  Chronic blood loss anemia  Precipitous drop in Hematocrit  Other Condition  Cannot Clinically Determine  Supporting Information:  Signs and Symptoms: GI Bleed= Melana  Diagnostics: Component     Latest Ref Rng 10/29/2014 10/29/2014 10/30/2014 10/30/2014 10/30/2014         4:23 PM  8:15 PM  4:25 AM 12:34 PM  7:32 PM  Hemoglobin     12.0 - 16.0 g/dL 11.3 (L) 11.1 (L) 10.2 (L) 9.4 (L) 9.5 (L)         10/31/2014  Hemoglobin     12.0 - 16.0 g/dL 9.0 (L)     Thank You, Alessandra Grout, RN, BSN, CCDS,Clinical Documentation Specialist:  [phone]  [phone]=[employer] [place]- Health Information Management

## 2014-10-31 NOTE — Discharge Instructions (Signed)
Your heart rate and blood pressure have been low during this admission.  Do not restart coreg or lasix unless blood pressure is up above 140/90 or until you see Dr. Ouida Sills next week. You had a bleeding ulcer. Do not restart coumadin until you see Dr Ouida Sills next week.  If for some reason you can not see him next week, you can restart coumadin on 6/8 if there has been no further bleeding.  DIET:  SOFT FOODS  DISCHARGE CONDITION:  Fair  ACTIVITY:  As tolerated  OXYGEN:  Home Oxygen: No  DISCHARGE LOCATION:  To home  If you experience worsening of your admission symptoms, develop shortness of breath, life threatening emergency, suicidal or homicidal thoughts you must seek medical attention immediately by calling 911 or calling your MD immediately  if symptoms less severe.  You Must read complete instructions/literature along with all the possible adverse reactions/side effects for all the Medicines you take and that have been prescribed to you. Take any new Medicines after you have completely understood and accpet all the possible adverse reactions/side effects.   Please note  You were cared for by a hospitalist during your hospital stay. If you have any questions about your discharge medications or the care you received while you were in the hospital after you are discharged, you can call the unit and asked to speak with the hospitalist on call if the hospitalist that took care of you is not available. Once you are discharged, your primary care physician will handle any further medical issues. Please note that NO REFILLS for any discharge medications will be authorized once you are discharged, as it is imperative that you return to your primary care physician (or establish a relationship with a primary care physician if you do not have one) for your aftercare needs so that they can reassess your need for medications and monitor your lab values.

## 2014-10-31 NOTE — Transfer of Care (Signed)
Immediate Anesthesia Transfer of Care Note  Patient: Kristen [name]  Procedure(s) Performed: Procedure(s): ESOPHAGOGASTRODUODENOSCOPY (EGD) WITH PROPOFOL (N/A)  Patient Location: Endoscopy Unit  Anesthesia Type:General  Level of Consciousness: awake, alert  and oriented  Airway & Oxygen Therapy: Patient Spontanous Breathing and Patient connected to nasal cannula oxygen  Post-op Assessment: Report given to RN and Post -op Vital signs reviewed and stable  Post vital signs: Reviewed and stable  Last Vitals:  Filed Vitals:   10/31/14 1404  BP: 147/93  Pulse: 69  Temp: 36.5 C  Resp: 20    Complications: No apparent anesthesia complications

## 2014-11-01 LAB — SURGICAL PATHOLOGY

## 2014-11-06 NOTE — Discharge Summary (Signed)
[place] [facility] [place] NAME: Kristen [name]    MR#:  [id number]  DATE OF BIRTH:  1940/07/21  DATE OF ADMISSION:  10/29/2014 ADMITTING PHYSICIAN: Loletha Grayer, MD  DATE OF DISCHARGE: 10/31/2014  6:45 PM  PRIMARY CARE PHYSICIAN: Kirk Ruths., MD    ADMISSION DIAGNOSIS:  Hypotension due to blood loss [I95.89] Gastrointestinal hemorrhage with melena [K92.1]  DISCHARGE DIAGNOSIS:  Active Problems:   GI bleed   SECONDARY DIAGNOSIS:   Past Medical History  Diagnosis Date  . Atrial fibrillation   . Thyroid disease   . Diabetes mellitus without complication   . Hypertension   . Hyperlipidemia   . Lung cancer 2015    Dr Merleen Nicely [facility] COURSE:   Problem #1 acute gastrointestinal hemorrhage initially with hypotension: No further bleeding noted after admission. Blood pressure improved with fluids. Hemoglobin decreased from 11.3 on admission to 9.0, then remained stabled for >24 hours. She was treated inpatient with IV Protonix 40 mg twice a day and should continue on the same orally after discharge. Coumadin held on admission, INR is 1.8 after 1 dose vitamin K. EGD by Dr. Allen Norris showed severe gastritis, one non bleeding ulcer.  After the procedure she insisted on discharge. She was tolerating a regular diet, blood pressure and hgb stable for >24 hours and no further bleeding noted so she was discharged with instructions to follow up with her PCP in one week. She will also need to follow up with Gastroenterology for a colonoscopy, and she will make this appointment.  She will hold coumadin for one week.  #2 hypotension: Antihypertensives held due to hypotension.  Blood pressure increased, losartan restarted. Coreg and furosemide held until her follow up with PCP.  #3 hypothyroidism: Continue levothyroxine  #4 hyperlipidemia: Continue statin  #5 atrial fibrillation: Coumadin held due to bleeding discussed  above. Coreg held due to hypotension and bradycardia. She will follow up with PCP in one week. Has been instructed to restart coumadin in one week if she does not have PCP appointment and no further bleeding is noted.    DISCHARGE CONDITIONS:   fair  CONSULTS OBTAINED:  Treatment Team:  Lucilla Lame, MD  DRUG ALLERGIES:   Allergies  Allergen Reactions  . Aleve [Naproxen Sodium]     Pt states can tolerate some, but causes GI upset  . Celebrex [Celecoxib] Other (See Comments)    Pt states can handle some, but causes GI upset    DISCHARGE MEDICATIONS:   Discharge Medication List as of 10/31/2014  5:40 PM    START taking these medications   Details  pantoprazole (PROTONIX) 40 MG tablet Take 1 tablet (40 mg total) by mouth 2 (two) times daily., Starting 10/31/2014, Until Discontinued, Print      CONTINUE these medications which have NOT CHANGED   Details  atorvastatin (LIPITOR) 10 MG tablet Take 10 mg by mouth daily., Until Discontinued, Historical Med    Cholecalciferol (VITAMIN D) 2000 UNITS CAPS Take 1 capsule by mouth daily., Until Discontinued, Historical Med    levothyroxine (SYNTHROID, LEVOTHROID) 50 MCG tablet Take 50 mcg by mouth daily., Until Discontinued, Historical Med    losartan (COZAAR) 50 MG tablet Take 50 mg by mouth daily., Until Discontinued, Historical Med    metFORMIN (GLUMETZA) 500 MG (MOD) 24 hr tablet Take 2,000 mg by mouth 2 (two) times daily. 2 tablets every morning and 2 tablets every evening, Until Discontinued, Historical Med  STOP taking these medications     carvedilol (COREG) 3.125 MG tablet      furosemide (LASIX) 40 MG tablet      warfarin (COUMADIN) 4 MG tablet          DISCHARGE INSTRUCTIONS:    Your heart rate and blood pressure have been low during this admission.  Do not restart coreg or lasix unless blood pressure is up above 140/90 or until you see Dr. Ouida Sills next week. You had a bleeding ulcer. Do not restart coumadin  until you see Dr Ouida Sills next week.  If for some reason you can not see him next week, you can restart coumadin on 6/8 if there has been no further bleeding.  DIET:  SOFT FOODS  DISCHARGE CONDITION:  Fair  ACTIVITY:  As tolerated  OXYGEN:  Home Oxygen: No  DISCHARGE LOCATION:  To home  If you experience worsening of your admission symptoms, develop shortness of breath, life threatening emergency, suicidal or homicidal thoughts you must seek medical attention immediately by calling 911 or calling your MD immediately  if symptoms less severe.  You Must read complete instructions/literature along with all the possible adverse reactions/side effects for all the Medicines you take and that have been prescribed to you. Take any new Medicines after you have completely understood and accept all the possible adverse reactions/side effects.   Please note  You were cared for by a hospitalist during your hospital stay. If you have any questions about your discharge medications or the care you received while you were in the hospital after you are discharged, you can call the unit and asked to speak with the hospitalist on call if the hospitalist that took care of you is not available. Once you are discharged, your primary care physician will handle any further medical issues. Please note that NO REFILLS for any discharge medications will be authorized once you are discharged, as it is imperative that you return to your primary care physician (or establish a relationship with a primary care physician if you do not have one) for your aftercare needs so that they can reassess your need for medications and monitor your lab values.  Today   CHIEF COMPLAINT:   Chief Complaint  Patient presents with  . GI Bleeding    HISTORY OF PRESENT ILLNESS:  Kristen [name] is a 74 y.o. female with a known history of atrial fibrillation on Coumadin. This morning at 5 AM she went to the bathroom she noticed black  stool and dark red blood all over the toilet estimated at least a cup full of blood. Then again at around 7:30 AM she had another bowel movement which was black and also dark red blood was seen. This was less blood than the 5 AM episode. Her INR is therapeutic at 2. She did have a hot sweating feeling with these episodes. In the ER her blood pressure was low and she felt lightheaded. No complaints of chest pain or shortness of breath. In the ER her first hemoglobin was 11.3. Hospitalist services were contacted for further evaluation  VITAL SIGNS:  Blood pressure 147/93, pulse 53, temperature 97.7 F (36.5 C), temperature source Axillary, resp. rate 16, height '5\' 3"'$  (1.6 m), weight 85.684 kg (188 lb 14.4 oz), SpO2 95 %.  I/O:  No intake or output data in the 24 hours ending 11/06/14 2052  PHYSICAL EXAMINATION:  GENERAL:  74 y.o.-year-old patient lying in the bed with no acute distress. Obese. EYES: Pupils equal, round,  reactive to light and accommodation. No scleral icterus. Extraocular muscles intact.  HEENT: Head atraumatic, normocephalic. Oropharynx and nasopharynx clear.  NECK:  Supple, no jugular venous distention. No thyroid enlargement, no tenderness.  LUNGS: Normal breath sounds bilaterally, no wheezing, rales,rhonchi or crepitation. No use of accessory muscles of respiration.  CARDIOVASCULAR: S1, S2 normal. No murmurs, rubs, or gallops.  ABDOMEN: Soft, non-tender, non-distended. Bowel sounds present. No organomegaly or mass.  EXTREMITIES: No pedal edema, cyanosis, or clubbing.  NEUROLOGIC: Cranial nerves II through XII are intact. Muscle strength 5/5 in all extremities. Sensation intact. Gait not checked.  PSYCHIATRIC: The patient is alert and oriented x 3.  SKIN: No obvious rash, lesion, or ulcer.   DATA REVIEW:   CBC  Recent Labs Lab 10/31/14 1046  HGB 9.6*    Chemistries  No results for input(s): NA, K, CL, CO2, GLUCOSE, BUN, CREATININE, CALCIUM, MG, AST, ALT, ALKPHOS,  BILITOT in the last 168 hours.  Invalid input(s): GFRCGP  Cardiac Enzymes No results for input(s): TROPONINI in the last 168 hours.  Microbiology Results  No results found for this or any previous visit.  RADIOLOGY:  No results found.  EKG:   Orders placed or performed during the hospital encounter of 10/29/14  . EKG 12-Lead  . EKG 12-Lead  . EKG      Management plans discussed with the patient, family and they are in agreement.  CODE STATUS: FULL  TOTAL TIME TAKING CARE OF THIS PATIENT: 45 minutes.    Myrtis Ser M.D on 11/06/2014 at 8:52 PM  Between 7am to 6pm - Pager - [phone]  After 6pm go to [email or web address] - password [email or web address] [place] [facility]  Office  [phone]  CC: Primary care physician; Kirk Ruths., MD

## 2014-11-14 ENCOUNTER — Telehealth: Payer: Self-pay

## 2014-11-14 NOTE — Telephone Encounter (Signed)
Pt notified of results. Pt stated she still had some of the protonix she was given and will restart it. Pt had procedure done at [facility] due to her A fib. She will need repeat in 6 weeks.

## 2014-11-14 NOTE — Telephone Encounter (Signed)
-----   Message from Lucilla Lame, MD sent at 11/01/2014 11:18 AM EDT ----- Let the patient know the stomach showed inflammation without infection. She needs a repeat EGD in 6 weeks after she has been treated with a PPI daily.

## 2015-01-13 ENCOUNTER — Telehealth: Payer: Self-pay

## 2015-01-13 NOTE — Telephone Encounter (Signed)
Pt has been scheduled with Dr. Tiffany Kocher for Colonoscopy/EGD on 01-30-15.

## 2015-01-13 NOTE — Telephone Encounter (Signed)
-----   Message from [place]ie [place], [place] sent at 11/14/2014  4:13 PM EDT ----- Regarding: repeat EGD Contact pt to schedule repeat EGD per Dr. Allen Norris. Make sure pt has been taking PPI for 6 weeks.

## 2015-01-29 ENCOUNTER — Encounter: Payer: Self-pay | Admitting: *Deleted

## 2015-01-30 ENCOUNTER — Encounter: Payer: Self-pay | Admitting: *Deleted

## 2015-01-30 ENCOUNTER — Encounter
Admission: RE | Disposition: A | Discharge status: Home / Self Care | Payer: Self-pay | Source: Ambulatory Visit | Attending: Unknown Physician Specialty

## 2015-01-30 ENCOUNTER — Ambulatory Visit: Payer: Commercial Managed Care - HMO | Admitting: Certified Registered Nurse Anesthetist

## 2015-01-30 ENCOUNTER — Ambulatory Visit
Admission: RE | Admit: 2015-01-30 | Discharge: 2015-01-30 | Disposition: A | Discharge status: Home / Self Care | Payer: Commercial Managed Care - HMO | Source: Ambulatory Visit | Attending: Unknown Physician Specialty | Admitting: Unknown Physician Specialty

## 2015-01-30 DIAGNOSIS — K219 Gastro-esophageal reflux disease without esophagitis: Secondary | ICD-10-CM | POA: Insufficient documentation

## 2015-01-30 DIAGNOSIS — E119 Type 2 diabetes mellitus without complications: Secondary | ICD-10-CM | POA: Diagnosis not present

## 2015-01-30 DIAGNOSIS — Z85118 Personal history of other malignant neoplasm of bronchus and lung: Secondary | ICD-10-CM | POA: Diagnosis not present

## 2015-01-30 DIAGNOSIS — I4891 Unspecified atrial fibrillation: Secondary | ICD-10-CM | POA: Diagnosis not present

## 2015-01-30 DIAGNOSIS — G4730 Sleep apnea, unspecified: Secondary | ICD-10-CM | POA: Diagnosis not present

## 2015-01-30 DIAGNOSIS — K5730 Diverticulosis of large intestine without perforation or abscess without bleeding: Secondary | ICD-10-CM | POA: Diagnosis not present

## 2015-01-30 DIAGNOSIS — E039 Hypothyroidism, unspecified: Secondary | ICD-10-CM | POA: Insufficient documentation

## 2015-01-30 DIAGNOSIS — K2980 Duodenitis without bleeding: Secondary | ICD-10-CM | POA: Insufficient documentation

## 2015-01-30 DIAGNOSIS — J449 Chronic obstructive pulmonary disease, unspecified: Secondary | ICD-10-CM | POA: Diagnosis not present

## 2015-01-30 DIAGNOSIS — Z79899 Other long term (current) drug therapy: Secondary | ICD-10-CM | POA: Insufficient documentation

## 2015-01-30 DIAGNOSIS — I10 Essential (primary) hypertension: Secondary | ICD-10-CM | POA: Insufficient documentation

## 2015-01-30 DIAGNOSIS — Z886 Allergy status to analgesic agent status: Secondary | ICD-10-CM | POA: Diagnosis not present

## 2015-01-30 DIAGNOSIS — E785 Hyperlipidemia, unspecified: Secondary | ICD-10-CM | POA: Diagnosis not present

## 2015-01-30 DIAGNOSIS — K317 Polyp of stomach and duodenum: Secondary | ICD-10-CM | POA: Insufficient documentation

## 2015-01-30 DIAGNOSIS — K253 Acute gastric ulcer without hemorrhage or perforation: Secondary | ICD-10-CM | POA: Insufficient documentation

## 2015-01-30 DIAGNOSIS — Z86010 Personal history of colonic polyps: Secondary | ICD-10-CM | POA: Insufficient documentation

## 2015-01-30 DIAGNOSIS — D649 Anemia, unspecified: Secondary | ICD-10-CM | POA: Insufficient documentation

## 2015-01-30 DIAGNOSIS — Z87891 Personal history of nicotine dependence: Secondary | ICD-10-CM | POA: Insufficient documentation

## 2015-01-30 DIAGNOSIS — K640 First degree hemorrhoids: Secondary | ICD-10-CM | POA: Diagnosis not present

## 2015-01-30 HISTORY — DX: Chronic obstructive pulmonary disease, unspecified: J44.9

## 2015-01-30 HISTORY — DX: Hypothyroidism, unspecified: E03.9

## 2015-01-30 HISTORY — PX: ESOPHAGOGASTRODUODENOSCOPY (EGD) WITH PROPOFOL: SHX5813

## 2015-01-30 HISTORY — PX: COLONOSCOPY WITH PROPOFOL: SHX5780

## 2015-01-30 LAB — GLUCOSE, CAPILLARY: GLUCOSE-CAPILLARY: 143 mg/dL — AB (ref 65–99)

## 2015-01-30 SURGERY — COLONOSCOPY WITH PROPOFOL
Anesthesia: General

## 2015-01-30 MED ORDER — LIDOCAINE HCL (CARDIAC) 20 MG/ML IV SOLN
INTRAVENOUS | Status: DC | PRN
  Administered 2015-01-30: 100 mg via INTRAVENOUS

## 2015-01-30 MED ORDER — PIPERACILLIN-TAZOBACTAM 3.375 G IVPB
3.3750 g | Freq: Once | INTRAVENOUS | Status: AC
  Administered 2015-01-30: 3.375 g via INTRAVENOUS
  Filled 2015-01-30: qty 50

## 2015-01-30 MED ORDER — PROPOFOL INFUSION 10 MG/ML OPTIME
INTRAVENOUS | Status: DC | PRN
  Administered 2015-01-30: 120 ug/kg/min via INTRAVENOUS

## 2015-01-30 MED ORDER — PROPOFOL 10 MG/ML IV BOLUS
INTRAVENOUS | Status: DC | PRN
  Administered 2015-01-30: 30 mg via INTRAVENOUS
  Administered 2015-01-30: 50 mg via INTRAVENOUS

## 2015-01-30 MED ORDER — EPHEDRINE SULFATE 50 MG/ML IJ SOLN
INTRAMUSCULAR | Status: DC | PRN
  Administered 2015-01-30: 10 mg via INTRAVENOUS

## 2015-01-30 MED ORDER — SODIUM CHLORIDE 0.9 % IV SOLN
INTRAVENOUS | Status: DC
  Administered 2015-01-30: 09:00:00 via INTRAVENOUS

## 2015-01-30 MED ORDER — SODIUM CHLORIDE 0.9 % IV SOLN: INTRAVENOUS | Status: DC

## 2015-01-30 MED ORDER — GLYCOPYRROLATE 0.2 MG/ML IJ SOLN
INTRAMUSCULAR | Status: DC | PRN
  Administered 2015-01-30: 0.2 mg via INTRAVENOUS

## 2015-01-30 MED ORDER — ALFENTANIL 500 MCG/ML IJ INJ
INJECTION | INTRAMUSCULAR | Status: DC | PRN
  Administered 2015-01-30: 500 ug via INTRAVENOUS

## 2015-01-30 NOTE — Op Note (Signed)
[facility] Gastroenterology Patient Name: Kristen [name] Procedure Date: 01/30/2015 10:23 AM MRN: [id number] Account #: [id number] Date of Birth: 1940/06/02 Admit Type: Outpatient Age: 74 Room: [facility] ENDO ROOM 1 Gender: Female Note Status: Finalized Procedure:         Upper GI endoscopy Indications:       Anemia, Follow-up of acute gastric ulcer Providers:         Manya Silvas, MD Referring MD:      Ocie Cornfield. Ouida Sills, MD (Referring MD) Medicines:         Propofol per Anesthesia Complications:     No immediate complications. Procedure:         Pre-Anesthesia Assessment:                    - After reviewing the risks and benefits, the patient was                     deemed in satisfactory condition to undergo the procedure.                    After obtaining informed consent, the endoscope was passed                     under direct vision. Throughout the procedure, the                     patient's blood pressure, pulse, and oxygen saturations                     were monitored continuously. The Endoscope was introduced                     through the mouth, and advanced to the second part of                     duodenum. The upper GI endoscopy was accomplished without                     difficulty. The patient tolerated the procedure well. Findings:      The examined esophagus was normal. GEJ 39cm.      Localized moderate inflammation characterized by erythema and       granularity was found in the prepyloric region of the stomach.      One non-bleeding cratered and linear gastric ulcer was found in the       prepyloric region of the stomach. The lesion was 3 mm in largest       dimension.      A single medium-sized sessile polyp with no bleeding was found in the       second part of the duodenum. Biopsies were taken with a cold forceps for       histology.      Diffuse moderate inflammation characterized by congestion (edema),       erythema and  granularity was found in the duodenal bulb. Impression:        - Normal esophagus.                    - Gastritis.                    - Gastric ulcer with clean base.                    -  A single duodenal polyp. Biopsied.                    - Duodenitis. Recommendation:    - Await pathology results. Manya Silvas, MD 01/30/2015 10:42:55 AM This report has been signed electronically. Number of Addenda: 0 Note Initiated On: 01/30/2015 10:23 AM      [facility]

## 2015-01-30 NOTE — Anesthesia Preprocedure Evaluation (Addendum)
Anesthesia Evaluation  Patient identified by MRN, date of birth, ID band Patient awake    Reviewed: Allergy & Precautions, H&P , NPO status , Patient's Chart, lab work & pertinent test results, reviewed documented beta blocker date and time   Airway Mallampati: II  TM Distance: >3 FB Neck ROM: full    Dental no notable dental hx. (+) Poor Dentition   Pulmonary neg shortness of breath, neg sleep apnea, COPDneg recent URI, former smoker,  breath sounds clear to auscultation  Pulmonary exam normal       Cardiovascular Exercise Tolerance: Good hypertension, - angina- CAD, - Past MI, - Cardiac Stents and - CABG Normal cardiovascular exam+ dysrhythmias Atrial Fibrillation - Valvular Problems/MurmursRhythm:regular Rate:Normal  Patient states that she has some tear in a blood vessel in her heart and that they want to do surgery.  Unknown if she is referring to her GI bleed or possibly a AAA.   Neuro/Psych negative neurological ROS  negative psych ROS   GI/Hepatic Neg liver ROS, GERD-  Controlled,  Endo/Other  diabetes, Oral Hypoglycemic AgentsHypothyroidism Morbid obesity  Renal/GU negative Renal ROS  negative genitourinary   Musculoskeletal   Abdominal   Peds  Hematology negative hematology ROS (+)   Anesthesia Other Findings Past Medical History:   Atrial fibrillation                                          Thyroid disease                                              Diabetes mellitus without complication                       Hypertension                                                 Hyperlipidemia                                               Lung cancer                                     2015           Comment:Dr Merleen Nicely [facility]   Hypothyroidism                                               COPD (chronic obstructive pulmonary disease)                 Reproductive/Obstetrics negative OB ROS                            Anesthesia Physical Anesthesia Plan  ASA: III  Anesthesia Plan: General   Post-op Pain  Management:    Induction:   Airway Management Planned:   Additional Equipment:   Intra-op Plan:   Post-operative Plan:   Informed Consent: I have reviewed the patients History and Physical, chart, labs and discussed the procedure including the risks, benefits and alternatives for the proposed anesthesia with the patient or authorized representative who has indicated his/her understanding and acceptance.   Dental Advisory Given  Plan Discussed with: Anesthesiologist, CRNA and Surgeon  Anesthesia Plan Comments:         Anesthesia Quick Evaluation

## 2015-01-30 NOTE — Anesthesia Postprocedure Evaluation (Signed)
  Anesthesia Post-op Note  Patient: Kristen [name]  Procedure(s) Performed: Procedure(s): COLONOSCOPY WITH PROPOFOL (N/A) ESOPHAGOGASTRODUODENOSCOPY (EGD) WITH PROPOFOL (N/A)  Anesthesia type:General  Patient location: PACU  Post pain: Pain level controlled  Post assessment: Post-op Vital signs reviewed, Patient's Cardiovascular Status Stable, Respiratory Function Stable, Patent Airway and No signs of Nausea or vomiting  Post vital signs: Reviewed and stable  Last Vitals:  Filed Vitals:   01/30/15 1140  BP:   Pulse: 54  Temp:   Resp: 23    Level of consciousness: awake, alert  and patient cooperative  Complications: No apparent anesthesia complications

## 2015-01-30 NOTE — H&P (Signed)
Primary Care Physician:  Kirk Ruths., MD Primary Gastroenterologist:  Dr. Vira Agar  Pre-Procedure History & Physical: HPI:  Kristen [name] is a 74 y.o. female is here for an endoscopy and colonoscopy.   Past Medical History  Diagnosis Date  . Atrial fibrillation   . Thyroid disease   . Diabetes mellitus without complication   . Hypertension   . Hyperlipidemia   . Lung cancer 2015    Dr Merleen Nicely [facility]  . Hypothyroidism   . COPD (chronic obstructive pulmonary disease)     Past Surgical History  Procedure Laterality Date  . Left knee surgery Left   . Lung biopsy    . Colonoscopy  2006    Dr Raynelle Fanning patient ?polyps  . Joint replacement      Prior to Admission medications   Medication Sig Start Date End Date Taking? Authorizing Zelta Enfield  atorvastatin (LIPITOR) 10 MG tablet Take 10 mg by mouth daily.   Yes Historical Kenora Spayd, MD  carvedilol (COREG) 3.125 MG tablet Take 3.125 mg by mouth 2 (two) times daily with a meal.   Yes Historical Alysha Doolan, MD  Cholecalciferol (VITAMIN D) 2000 UNITS CAPS Take 1 capsule by mouth daily.   Yes Historical Kaylie Ritter, MD  furosemide (LASIX) 40 MG tablet Take 40 mg by mouth.   Yes Historical Laren Whaling, MD  levothyroxine (SYNTHROID, LEVOTHROID) 50 MCG tablet Take 50 mcg by mouth daily.   Yes Historical Alyna Stensland, MD  losartan (COZAAR) 50 MG tablet Take 50 mg by mouth daily.   Yes Historical Atleigh Gruen, MD  metFORMIN (GLUMETZA) 500 MG (MOD) 24 hr tablet Take 2,000 mg by mouth 2 (two) times daily. 2 tablets every morning and 2 tablets every evening   Yes Historical Janea Schwenn, MD  pantoprazole (PROTONIX) 40 MG tablet Take 1 tablet (40 mg total) by mouth 2 (two) times daily. 10/31/14  Yes Aldean Jewett, MD    Allergies as of 01/01/2015 - Review Complete 10/31/2014  Allergen Reaction Noted  . Aleve [naproxen sodium]  10/29/2014  . Celebrex [celecoxib] Other (See Comments) 10/29/2014    Family History  Problem Relation Age of Onset  .  Heart attack Mother   . Lung cancer Father   . Heart attack Son 69    Social History   Social History  . Marital Status: Widowed    Spouse Name: N/A  . Number of Children: 2  . Years of Education: N/A   Occupational History  . retired Theme park manager    Social History Main Topics  . Smoking status: Former Smoker    Quit date: 10/29/2013  . Smokeless tobacco: Never Used  . Alcohol Use: No  . Drug Use: No  . Sexual Activity: No   Other Topics Concern  . Not on file   Social History Narrative   Lives with her son    Review of Systems: See HPI, otherwise negative ROS  Physical Exam: BP 179/81 mmHg  Pulse 58  Temp(Src) 97.7 F (36.5 C) (Tympanic)  Resp 16  Ht '5\' 3"'$  (1.6 m)  Wt 90.719 kg (200 lb)  BMI 35.44 kg/m2  SpO2 93% General:   Alert,  pleasant and cooperative in NAD Head:  Normocephalic and atraumatic. Neck:  Supple; no masses or thyromegaly. Lungs:  Clear throughout to auscultation.    Heart:  Regular rate and rhythm. Abdomen:  Soft, nontender and nondistended. Normal bowel sounds, without guarding, and without rebound.   Neurologic:  Alert and  oriented x4;  grossly normal neurologically.  Impression/Plan: Kristen [name]  Kristen [name] is here for an endoscopy and colonoscopy to be performed for F/U gastric ulcer, anemia, previous adenomatous polyps  Risks, benefits, limitations, and alternatives regarding  endoscopy and colonoscopy have been reviewed with the patient.  Questions have been answered.  All parties agreeable.   Gaylyn Cheers, MD  01/30/2015, 10:22 AM

## 2015-01-30 NOTE — Transfer of Care (Signed)
Immediate Anesthesia Transfer of Care Note  Patient: Kristen [name]  Procedure(s) Performed: Procedure(s): COLONOSCOPY WITH PROPOFOL (N/A) ESOPHAGOGASTRODUODENOSCOPY (EGD) WITH PROPOFOL (N/A)  Patient Location: PACU  Anesthesia Type:General  Level of Consciousness: awake, alert , oriented and patient cooperative  Airway & Oxygen Therapy: Patient Spontanous Breathing and Patient connected to nasal cannula oxygen  Post-op Assessment: Report given to RN and Post -op Vital signs reviewed and stable  Post vital signs: Reviewed and stable  Last Vitals:  Filed Vitals:   01/30/15 1103  BP: 98/71  Pulse:   Temp: 36.3 C  Resp: 19    Complications: No apparent anesthesia complications

## 2015-01-30 NOTE — Op Note (Signed)
[facility] Gastroenterology Patient Name: Kristen [name] Procedure Date: 01/30/2015 10:22 AM MRN: [id number] Account #: [id number] Date of Birth: 04/08/1941 Admit Type: Outpatient Age: 74 Room: [facility] ENDO ROOM 1 Gender: Female Note Status: Finalized Procedure:         Colonoscopy Indications:       Personal history of colonic polyps Providers:         Manya Silvas, MD Referring MD:      Ocie Cornfield. Ouida Sills, MD (Referring MD) Medicines:         Propofol per Anesthesia Complications:     No immediate complications. Procedure:         Pre-Anesthesia Assessment:                    - After reviewing the risks and benefits, the patient was                     deemed in satisfactory condition to undergo the procedure.                    After obtaining informed consent, the colonoscope was                     passed under direct vision. Throughout the procedure, the                     patient's blood pressure, pulse, and oxygen saturations                     were monitored continuously. The Colonoscope was                     introduced through the anus and advanced to the the cecum,                     identified by appendiceal orifice and ileocecal valve. The                     colonoscopy was performed without difficulty. The patient                     tolerated the procedure well. The quality of the bowel                     preparation was excellent. Findings:      Multiple small-mouthed diverticula were found in the sigmoid colon, in       the descending colon and in the transverse colon.      Internal hemorrhoids were found during endoscopy. The hemorrhoids were       small and Grade I (internal hemorrhoids that do not prolapse).      The exam was otherwise without abnormality. Impression:        - Diverticulosis in the sigmoid colon, in the descending                     colon and in the transverse colon.                    - Internal hemorrhoids.             - The examination was otherwise normal.                    - No specimens collected. Recommendation:    -  The findings and recommendations were discussed with the                     patient's family. Due to age no repeat recommended unless                     having a bowel problem. Manya Silvas, MD 01/30/2015 10:59:02 AM This report has been signed electronically. Number of Addenda: 0 Note Initiated On: 01/30/2015 10:22 AM Scope Withdrawal Time: 0 hours 6 minutes 10 seconds  Total Procedure Duration: 0 hours 11 minutes 18 seconds       [facility]

## 2015-01-31 LAB — SURGICAL PATHOLOGY

## 2015-03-31 DIAGNOSIS — D509 Iron deficiency anemia, unspecified: Secondary | ICD-10-CM | POA: Insufficient documentation

## 2015-04-10 DIAGNOSIS — Z7189 Other specified counseling: Secondary | ICD-10-CM | POA: Insufficient documentation

## 2015-06-04 ENCOUNTER — Other Ambulatory Visit: Payer: Self-pay | Admitting: Internal Medicine

## 2015-06-04 DIAGNOSIS — Z1231 Encounter for screening mammogram for malignant neoplasm of breast: Secondary | ICD-10-CM

## 2015-07-18 ENCOUNTER — Ambulatory Visit
Admission: RE | Admit: 2015-07-18 | Discharge: 2015-07-18 | Disposition: A | Discharge status: Home / Self Care | Payer: Commercial Managed Care - HMO | Source: Ambulatory Visit | Attending: Internal Medicine | Admitting: Internal Medicine

## 2015-07-18 ENCOUNTER — Other Ambulatory Visit: Payer: Self-pay | Admitting: Internal Medicine

## 2015-07-18 DIAGNOSIS — Z1231 Encounter for screening mammogram for malignant neoplasm of breast: Secondary | ICD-10-CM | POA: Diagnosis not present

## 2015-11-28 ENCOUNTER — Inpatient Hospital Stay: Payer: Commercial Managed Care - HMO | Attending: Oncology | Admitting: Oncology

## 2015-11-28 VITALS — BP 167/83 | HR 56 | Temp 97.4°F | Resp 18 | Wt 205.5 lb

## 2015-11-28 DIAGNOSIS — Z7984 Long term (current) use of oral hypoglycemic drugs: Secondary | ICD-10-CM | POA: Insufficient documentation

## 2015-11-28 DIAGNOSIS — I10 Essential (primary) hypertension: Secondary | ICD-10-CM | POA: Insufficient documentation

## 2015-11-28 DIAGNOSIS — E119 Type 2 diabetes mellitus without complications: Secondary | ICD-10-CM

## 2015-11-28 DIAGNOSIS — Z79899 Other long term (current) drug therapy: Secondary | ICD-10-CM | POA: Insufficient documentation

## 2015-11-28 DIAGNOSIS — C3482 Malignant neoplasm of overlapping sites of left bronchus and lung: Secondary | ICD-10-CM | POA: Insufficient documentation

## 2015-11-28 DIAGNOSIS — Z923 Personal history of irradiation: Secondary | ICD-10-CM | POA: Diagnosis not present

## 2015-11-28 DIAGNOSIS — I4891 Unspecified atrial fibrillation: Secondary | ICD-10-CM | POA: Insufficient documentation

## 2015-11-28 DIAGNOSIS — C3492 Malignant neoplasm of unspecified part of left bronchus or lung: Secondary | ICD-10-CM | POA: Insufficient documentation

## 2015-11-28 DIAGNOSIS — J449 Chronic obstructive pulmonary disease, unspecified: Secondary | ICD-10-CM | POA: Diagnosis not present

## 2015-11-28 DIAGNOSIS — E039 Hypothyroidism, unspecified: Secondary | ICD-10-CM | POA: Insufficient documentation

## 2015-11-28 DIAGNOSIS — Z7901 Long term (current) use of anticoagulants: Secondary | ICD-10-CM | POA: Insufficient documentation

## 2015-11-28 DIAGNOSIS — E785 Hyperlipidemia, unspecified: Secondary | ICD-10-CM

## 2015-11-28 DIAGNOSIS — Z87891 Personal history of nicotine dependence: Secondary | ICD-10-CM | POA: Diagnosis not present

## 2015-11-28 NOTE — Progress Notes (Signed)
[place]  [phone] [phone]  ID: Kristen [name] OB: [birth year]  MR#: [id number]  POE#:[id number]  Patient Care Team: Kirk Ruths, MD as PCP - General (Internal Medicine)  CHIEF COMPLAINT:  Chief Complaint  Patient presents with  . Lung Cancer    INTERVAL HISTORY: Patient is a 75 year old female was originally diagnosed with lung cancer in 2014 and has received all of her treatment up to this point at [facility]. She had a change in insurance and now is transferring care to continue treatment. See patient for oncologic history below. Currently, she has chronic shortness of breath but otherwise feels well. She has no neurologic complaints. She denies any recent fevers or illnesses. She has a good appetite and denies weight loss. She has no chest pain or shortness of breath. She denies any nausea, vomiting, constipation, or diarrhea. She has no urinary complaints. Patient feels at her baseline and offers no specific complaints today.   REVIEW OF SYSTEMS:   Review of Systems  Constitutional: Negative.  Negative for fever, weight loss and malaise/fatigue.  Respiratory: Positive for shortness of breath. Negative for cough.   Cardiovascular: Negative.  Negative for chest pain.  Gastrointestinal: Negative.  Negative for nausea, vomiting and abdominal pain.  Genitourinary: Negative.   Musculoskeletal: Negative.   Neurological: Negative.  Negative for weakness.  Psychiatric/Behavioral: Negative.     As per HPI. Otherwise, a complete review of systems is negatve.  PAST MEDICAL HISTORY: Past Medical History  Diagnosis Date  . Atrial fibrillation ([place])   . Thyroid disease   . Diabetes mellitus without complication ([place])   . Hypertension   . Hyperlipidemia   . Hypothyroidism   . COPD (chronic obstructive pulmonary disease) ([place])   . Lung cancer [facility]) 2015    Dr Kathrene Alu, chemo and rad    PAST SURGICAL HISTORY: Past  Surgical History  Procedure Laterality Date  . Left knee surgery Left   . Lung biopsy    . Colonoscopy  2006    Dr Raynelle Fanning patient ?polyps  . Joint replacement    . Colonoscopy with propofol N/A 01/30/2015    Procedure: COLONOSCOPY WITH PROPOFOL;  Surgeon: Manya Silvas, MD;  Location: [facility] ENDOSCOPY;  Service: Endoscopy;  Laterality: N/A;  . Esophagogastroduodenoscopy (egd) with propofol N/A 01/30/2015    Procedure: ESOPHAGOGASTRODUODENOSCOPY (EGD) WITH PROPOFOL;  Surgeon: Manya Silvas, MD;  Location: [facility] ENDOSCOPY;  Service: Endoscopy;  Laterality: N/A;    FAMILY HISTORY Family History  Problem Relation Age of Onset  . Heart attack Mother   . Lung cancer Father   . Heart attack Son 75       ADVANCED DIRECTIVES:    HEALTH MAINTENANCE: Social History  Substance Use Topics  . Smoking status: Former Smoker    Quit date: 10/29/2013  . Smokeless tobacco: Never Used  . Alcohol Use: No     Colonoscopy:  PAP:  Bone density:  Lipid panel:  Allergies  Allergen Reactions  . Aleve [Naproxen Sodium]     Pt states can tolerate some, but causes GI upset  . Celebrex [Celecoxib] Other (See Comments)    Pt states can handle some, but causes GI upset    Current Outpatient Prescriptions  Medication Sig Dispense Refill  . atorvastatin (LIPITOR) 10 MG tablet Take 10 mg by mouth daily.    . carvedilol (COREG) 3.125 MG tablet Take 3.125 mg by mouth 2 (two) times daily with a meal.    . Cholecalciferol (  VITAMIN D) 2000 UNITS CAPS Take 1 capsule by mouth daily.    . furosemide (LASIX) 40 MG tablet Take 40 mg by mouth.    . levothyroxine (SYNTHROID, LEVOTHROID) 50 MCG tablet Take 50 mcg by mouth daily.    Marland Kitchen losartan (COZAAR) 50 MG tablet Take 50 mg by mouth daily.    . Magnesium 250 MG TABS Take 1 tablet by mouth daily.    . metFORMIN (GLUMETZA) 500 MG (MOD) 24 hr tablet Take 1,000 mg by mouth 2 (two) times daily with a meal. 2 tablets every morning and 2 tablets every evening     . pantoprazole (PROTONIX) 40 MG tablet Take 1 tablet (40 mg total) by mouth 2 (two) times daily. 60 tablet 0  . warfarin (COUMADIN) 4 MG tablet Take 6 mg by mouth daily.     No current facility-administered medications for this visit.    OBJECTIVE: Filed Vitals:   11/28/15 1349  BP: 167/83  Pulse: 56  Temp: 97.4 F (36.3 C)  Resp: 18     Body mass index is 36.41 kg/(m^2).    ECOG FS:0 - Asymptomatic  General: Well-developed, well-nourished, no acute distress. Eyes: Pink conjunctiva, anicteric sclera. HEENT: Normocephalic, moist mucous membranes, clear oropharnyx. Lungs: Clear to auscultation bilaterally. Heart: Regular rate and rhythm. No rubs, murmurs, or gallops. Abdomen: Soft, nontender, nondistended. No organomegaly noted, normoactive bowel sounds. Musculoskeletal: No edema, cyanosis, or clubbing. Neuro: Alert, answering all questions appropriately. Cranial nerves grossly intact. Skin: No rashes or petechiae noted. Psych: Normal affect. Lymphatics: No cervical, calvicular, axillary or inguinal LAD.   LAB RESULTS:  Lab Results  Component Value Date   NA 140 10/30/2014   K 3.9 10/30/2014   CL 108 10/30/2014   CO2 26 10/30/2014   GLUCOSE 149* 10/30/2014   BUN 40* 10/30/2014   CREATININE 0.65 10/30/2014   CALCIUM 8.3* 10/30/2014   PROT 6.7 10/29/2014   ALBUMIN 3.3* 10/29/2014   AST 22 10/29/2014   ALT 15 10/29/2014   ALKPHOS 50 10/29/2014   BILITOT 0.3 10/29/2014   GFRNONAA >60 10/30/2014   GFRAA >60 10/30/2014    Lab Results  Component Value Date   WBC 8.5 10/30/2014   NEUTROABS 7.2* 10/29/2014   HGB 9.6* 10/31/2014   HCT 30.2* 10/30/2014   MCV 98.7 10/30/2014   PLT 155 10/30/2014   ONCOLOGIC HISTORY:   Patient was initially diagnosed with a 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 PET scan on August 25, 2012 revealed a hypermetabolic nodule 2.1 cm in the left apices. Bronchoscopy on August 31, 2012 did not show any evidence of malignancy, but  CT-guided biopsy on September 18, 2012 confirmed squamous cell carcinoma. Patient was determined not to be a surgical candidate at that time and underwent SBRT in May 2014. CT scan on April 23, 2013 revealed an enlarging prevascular lymph node. PET scan on June 04, 2013 revealed a new enlarging left lobe paratracheal lymph node with no other evidence of metastatic disease. Subsequent EBUS on June 13, 2013 confirmed recurrent squamous cell carcinoma. Patient underwent XRT along with concurrent navelbine between July 16, 2013 - August 28, 2013. Patient was then disease-free until CT scan on May 2,2017 revealed an interval increase of a nodule measuring up to 2.3 cm in the left lower lobe. PET scan on Oct 12, 1608 was hypermetabolic in the left upper, left lower and right middle lobe nodules concerning for neoplasm.  STUDIES: No results found.  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  PLAN:    1. 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 Agree with dose reduced salvage chemotherapy using carboplatinum AUC 4 on day 1 and gemcitabine 75 mg/m on days 1 and 8. Immunotherapy was considered, but patient's PDL 1 is less than 1%. Patient refuses port placement. Return to clinic in 1 week to initiate cycle 1, day 1 of carboplatinum and gemcitabine. 2. Hypertension: Patient's blood pressure is elevated today, continue to monitor. Continue current medications.  Approximately 45 minutes was spent in discussion of which greater than 50% was consultation.  Patient expressed understanding and was in agreement with this plan. She also understands that She can call clinic at any time with any questions, concerns, or complaints.    Lloyd Huger, MD   11/28/2015 9:53 PM

## 2015-11-28 NOTE — Progress Notes (Signed)
States has occasional shortness of breath on exertion. Knee pain. Here to discuss treatment for lung cancer.

## 2015-12-05 ENCOUNTER — Inpatient Hospital Stay: Payer: Commercial Managed Care - HMO

## 2015-12-05 ENCOUNTER — Inpatient Hospital Stay (HOSPITAL_BASED_OUTPATIENT_CLINIC_OR_DEPARTMENT_OTHER): Payer: Commercial Managed Care - HMO | Admitting: Oncology

## 2015-12-05 ENCOUNTER — Inpatient Hospital Stay: Payer: Commercial Managed Care - HMO | Attending: Oncology

## 2015-12-05 VITALS — BP 160/88 | HR 73 | Temp 96.6°F | Resp 18 | Wt 203.5 lb

## 2015-12-05 DIAGNOSIS — C3482 Malignant neoplasm of overlapping sites of left bronchus and lung: Secondary | ICD-10-CM

## 2015-12-05 DIAGNOSIS — Z7901 Long term (current) use of anticoagulants: Secondary | ICD-10-CM

## 2015-12-05 DIAGNOSIS — I10 Essential (primary) hypertension: Secondary | ICD-10-CM | POA: Diagnosis not present

## 2015-12-05 DIAGNOSIS — Z801 Family history of malignant neoplasm of trachea, bronchus and lung: Secondary | ICD-10-CM | POA: Diagnosis not present

## 2015-12-05 DIAGNOSIS — E039 Hypothyroidism, unspecified: Secondary | ICD-10-CM | POA: Diagnosis not present

## 2015-12-05 DIAGNOSIS — Z5111 Encounter for antineoplastic chemotherapy: Secondary | ICD-10-CM | POA: Insufficient documentation

## 2015-12-05 DIAGNOSIS — E876 Hypokalemia: Secondary | ICD-10-CM

## 2015-12-05 DIAGNOSIS — I4891 Unspecified atrial fibrillation: Secondary | ICD-10-CM | POA: Diagnosis not present

## 2015-12-05 DIAGNOSIS — E119 Type 2 diabetes mellitus without complications: Secondary | ICD-10-CM | POA: Diagnosis not present

## 2015-12-05 DIAGNOSIS — Z7984 Long term (current) use of oral hypoglycemic drugs: Secondary | ICD-10-CM

## 2015-12-05 DIAGNOSIS — C7802 Secondary malignant neoplasm of left lung: Secondary | ICD-10-CM | POA: Diagnosis not present

## 2015-12-05 DIAGNOSIS — C7801 Secondary malignant neoplasm of right lung: Secondary | ICD-10-CM | POA: Insufficient documentation

## 2015-12-05 DIAGNOSIS — E785 Hyperlipidemia, unspecified: Secondary | ICD-10-CM | POA: Insufficient documentation

## 2015-12-05 DIAGNOSIS — C3492 Malignant neoplasm of unspecified part of left bronchus or lung: Secondary | ICD-10-CM | POA: Diagnosis not present

## 2015-12-05 DIAGNOSIS — Z79899 Other long term (current) drug therapy: Secondary | ICD-10-CM

## 2015-12-05 DIAGNOSIS — Z87891 Personal history of nicotine dependence: Secondary | ICD-10-CM | POA: Insufficient documentation

## 2015-12-05 DIAGNOSIS — J449 Chronic obstructive pulmonary disease, unspecified: Secondary | ICD-10-CM | POA: Diagnosis not present

## 2015-12-05 LAB — COMPREHENSIVE METABOLIC PANEL
ALBUMIN: 3.6 g/dL (ref 3.5–5.0)
ALK PHOS: 52 U/L (ref 38–126)
ALT: 17 U/L (ref 14–54)
ANION GAP: 8 (ref 5–15)
AST: 19 U/L (ref 15–41)
BILIRUBIN TOTAL: 0.6 mg/dL (ref 0.3–1.2)
BUN: 16 mg/dL (ref 6–20)
CALCIUM: 8.9 mg/dL (ref 8.9–10.3)
CO2: 31 mmol/L (ref 22–32)
Chloride: 101 mmol/L (ref 101–111)
Creatinine, Ser: 0.68 mg/dL (ref 0.44–1.00)
GFR calc Af Amer: >60 mL/min (ref >60)
GFR calc non Af Amer: >60 mL/min (ref >60)
GLUCOSE: 177 mg/dL — AB (ref 65–99)
Potassium: 3.4 mmol/L — ABNORMAL LOW (ref 3.5–5.1)
SODIUM: 140 mmol/L (ref 135–145)
TOTAL PROTEIN: 7.2 g/dL (ref 6.5–8.1)

## 2015-12-05 LAB — CBC WITH DIFFERENTIAL/PLATELET
BASOS ABS: 0 10*3/uL (ref 0–0.1)
BASOS PCT: 1 %
EOS ABS: 0.1 10*3/uL (ref 0–0.7)
Eosinophils Relative: 2 %
HEMATOCRIT: 41.8 % (ref 35.0–47.0)
HEMOGLOBIN: 13.6 g/dL (ref 12.0–16.0)
Lymphocytes Relative: 13 %
Lymphs Abs: 0.8 10*3/uL — ABNORMAL LOW (ref 1.0–3.6)
MCH: 29.3 pg (ref 26.0–34.0)
MCHC: 32.4 g/dL (ref 32.0–36.0)
MCV: 90.5 fL (ref 80.0–100.0)
MONOS PCT: 11 %
Monocytes Absolute: 0.7 10*3/uL (ref 0.2–0.9)
NEUTROS ABS: 4.5 10*3/uL (ref 1.4–6.5)
NEUTROS PCT: 73 %
Platelets: 186 10*3/uL (ref 150–440)
RBC: 4.62 MIL/uL (ref 3.80–5.20)
RDW: 18.5 % — ABNORMAL HIGH (ref 11.5–14.5)
WBC: 6.2 10*3/uL (ref 3.6–11.0)

## 2015-12-05 MED ORDER — SODIUM CHLORIDE 0.9 % IV SOLN
750.0000 mg/m2 | Freq: Once | INTRAVENOUS | Status: AC
  Administered 2015-12-05: 1520 mg via INTRAVENOUS
  Filled 2015-12-05: qty 40

## 2015-12-05 MED ORDER — SODIUM CHLORIDE 0.9 % IV SOLN
381.2000 mg | Freq: Once | INTRAVENOUS | Status: AC
  Administered 2015-12-05: 380 mg via INTRAVENOUS
  Filled 2015-12-05: qty 38

## 2015-12-05 MED ORDER — SODIUM CHLORIDE 0.9 % IV SOLN
Freq: Once | INTRAVENOUS | Status: AC
  Administered 2015-12-05: 10:00:00 via INTRAVENOUS
  Filled 2015-12-05: qty 1000

## 2015-12-05 MED ORDER — SODIUM CHLORIDE 0.9 % IV SOLN
10.0000 mg | Freq: Once | INTRAVENOUS | Status: AC
  Administered 2015-12-05: 10 mg via INTRAVENOUS
  Filled 2015-12-05: qty 1

## 2015-12-05 NOTE — Progress Notes (Signed)
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  INTERVAL HISTORY: Patient returns to clinic today for further evaluation and consideration of cycle 1, day 1 of dose reduced carboplatinum and gemcitabine. She continues to have chronic shortness of breath but otherwise feels well. She has no neurologic complaints. She denies any recent fevers or illnesses. She has a good appetite and denies weight loss. She has no chest pain or shortness of breath. She denies any nausea, vomiting, constipation, or diarrhea. She has no urinary complaints. Patient offers no specific complaints today.   REVIEW OF SYSTEMS:   Review of Systems  Constitutional: Negative.  Negative for fever, weight loss and malaise/fatigue.  Respiratory: Positive for shortness of breath. Negative for cough.   Cardiovascular: Negative.  Negative for chest pain.  Gastrointestinal: Negative.  Negative for nausea, vomiting and abdominal pain.  Genitourinary: Negative.   Musculoskeletal: Negative.   Neurological: Negative.  Negative for weakness.  Psychiatric/Behavioral: Negative.     As per HPI. Otherwise, a complete review of systems is negatve.  PAST MEDICAL HISTORY: Past Medical History  Diagnosis Date  . Atrial fibrillation ([place])   . Thyroid disease   . Diabetes mellitus without complication ([place])   . Hypertension   . Hyperlipidemia   . Hypothyroidism   . COPD (chronic obstructive pulmonary disease) ([place])   . Lung cancer [facility]) 2015    Dr Kathrene Alu, chemo and rad    PAST SURGICAL HISTORY: Past Surgical History  Procedure Laterality Date  . Left knee surgery Left   . Lung biopsy    . Colonoscopy  2006    Dr Raynelle Fanning  patient ?polyps  . Joint replacement    . Colonoscopy with propofol N/A 01/30/2015    Procedure: COLONOSCOPY WITH PROPOFOL;  Surgeon: Manya Silvas, MD;  Location: [facility] ENDOSCOPY;  Service: Endoscopy;  Laterality: N/A;  . Esophagogastroduodenoscopy (egd) with propofol N/A 01/30/2015    Procedure: ESOPHAGOGASTRODUODENOSCOPY (EGD) WITH PROPOFOL;  Surgeon: Manya Silvas, MD;  Location: [facility] ENDOSCOPY;  Service: Endoscopy;  Laterality: N/A;    FAMILY HISTORY Family History  Problem Relation Age of Onset  . Heart attack Mother   . Lung cancer Father   . Heart attack Son 52       ADVANCED DIRECTIVES:    HEALTH MAINTENANCE: Social History  Substance Use Topics  . Smoking status: Former Smoker    Quit date: 10/29/2013  . Smokeless tobacco: Never Used  . Alcohol Use: No     Colonoscopy:  PAP:  Bone density:  Lipid panel:  Allergies  Allergen Reactions  . Aleve [Naproxen Sodium]     Pt states can tolerate some, but causes GI upset  . Celebrex [Celecoxib] Other (See Comments)    Pt states can handle some, but causes GI upset    Current Outpatient Prescriptions  Medication Sig Dispense Refill  . atorvastatin (LIPITOR) 10 MG tablet Take 10 mg by mouth daily.    . carvedilol (COREG) 3.125 MG tablet Take 3.125 mg by mouth 2 (two) times daily with a meal.    . Cholecalciferol (VITAMIN D) 2000 UNITS CAPS Take 1 capsule by mouth daily.    . furosemide (LASIX) 40 MG tablet Take 40 mg by mouth.    Marland Kitchen  levothyroxine (SYNTHROID, LEVOTHROID) 50 MCG tablet Take 50 mcg by mouth daily.    Marland Kitchen losartan (COZAAR) 50 MG tablet Take 50 mg by mouth daily.    . Magnesium 250 MG TABS Take 1 tablet by mouth daily.    . metFORMIN (GLUMETZA) 500 MG (MOD) 24 hr tablet Take 1,000 mg by mouth 2 (two) times daily with a meal. 2 tablets every morning and 2 tablets every evening    . pantoprazole (PROTONIX) 40 MG tablet Take 1 tablet (40 mg total) by mouth 2 (two) times daily. 60 tablet 0  . warfarin  (COUMADIN) 4 MG tablet Take 6 mg by mouth daily.     No current facility-administered medications for this visit.    OBJECTIVE: Filed Vitals:   12/05/15 0922 12/05/15 0929  BP: 214/72 160/88  Pulse: 73   Temp: 96.6 F (35.9 C)   Resp: 18      Body mass index is 36.05 kg/(m^2).    ECOG FS:0 - Asymptomatic  General: Well-developed, well-nourished, no acute distress. Eyes: Pink conjunctiva, anicteric sclera. Lungs: Clear to auscultation bilaterally. Heart: Regular rate and rhythm. No rubs, murmurs, or gallops. Abdomen: Soft, nontender, nondistended. No organomegaly noted, normoactive bowel sounds. Musculoskeletal: No edema, cyanosis, or clubbing. Neuro: Alert, answering all questions appropriately. Cranial nerves grossly intact. Skin: No rashes or petechiae noted. Psych: Normal affect.   LAB RESULTS:  Lab Results  Component Value Date   NA 140 12/05/2015   K 3.4* 12/05/2015   CL 101 12/05/2015   CO2 31 12/05/2015   GLUCOSE 177* 12/05/2015   BUN 16 12/05/2015   CREATININE 0.68 12/05/2015   CALCIUM 8.9 12/05/2015   PROT 7.2 12/05/2015   ALBUMIN 3.6 12/05/2015   AST 19 12/05/2015   ALT 17 12/05/2015   ALKPHOS 52 12/05/2015   BILITOT 0.6 12/05/2015   GFRNONAA >60 12/05/2015   GFRAA >60 12/05/2015    Lab Results  Component Value Date   WBC 6.2 12/05/2015   NEUTROABS 4.5 12/05/2015   HGB 13.6 12/05/2015   HCT 41.8 12/05/2015   MCV 90.5 12/05/2015   PLT 186 12/05/2015   ONCOLOGIC HISTORY:   Patient was initially diagnosed with a 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 PET scan on August 25, 2012 revealed a hypermetabolic nodule 2.1 cm in the left apices. Bronchoscopy on August 31, 2012 did not show any evidence of malignancy, but CT-guided biopsy on September 18, 2012 confirmed squamous cell carcinoma. Patient was determined not to be a surgical candidate at that time and underwent SBRT in May 2014. CT scan on April 23, 2013 revealed an enlarging prevascular lymph  node. PET scan on June 04, 2013 revealed a new enlarging left lobe paratracheal lymph node with no other evidence of metastatic disease. Subsequent EBUS on June 13, 2013 confirmed recurrent squamous cell carcinoma. Patient underwent XRT along with concurrent navelbine between July 16, 2013 - August 28, 2013. Patient was then disease-free until CT scan on May 2,2017 revealed an interval increase of a nodule measuring up to 2.3 cm in the left lower lobe. PET scan on Oct 12, 865 was hypermetabolic in the left upper, left lower and right middle lobe nodules concerning for neoplasm.  STUDIES: No results found.  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  PLAN:    1. 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  Proceed with  cycle 1, day 1 of carboplatinum and gemcitabine. Patient will receive carboplatinum AUC 4 on day 1 and gemcitabine 75 mg/m on days 1 and 8. Immunotherapy was considered, but patient's PDL 1 is less than 1%. Patient refuses port placement. Return to clinic in 1 week for consideration of cycle 1, day 8. Gemcitabine only.   2. Hypertension: Patient's blood pressure is  significantly elevated today, continue to monitor. Continue current medications. 3. Hypokalemia: Patient was given dietary recommendations.   Patient expressed understanding and was in agreement with this plan. She also understands that She can call clinic at any time with any questions, concerns, or complaints.    Lloyd Huger, MD   12/05/2015 1:42 PM

## 2015-12-05 NOTE — Progress Notes (Signed)
States has right knee discomfort this morning.

## 2015-12-12 ENCOUNTER — Inpatient Hospital Stay: Payer: Commercial Managed Care - HMO

## 2015-12-12 ENCOUNTER — Inpatient Hospital Stay (HOSPITAL_BASED_OUTPATIENT_CLINIC_OR_DEPARTMENT_OTHER): Payer: Commercial Managed Care - HMO | Admitting: Oncology

## 2015-12-12 VITALS — BP 173/78 | HR 55 | Temp 97.5°F | Ht 63.0 in | Wt 203.9 lb

## 2015-12-12 VITALS — BP 132/70 | HR 52 | Temp 97.0°F | Resp 20

## 2015-12-12 DIAGNOSIS — Z79899 Other long term (current) drug therapy: Secondary | ICD-10-CM

## 2015-12-12 DIAGNOSIS — I10 Essential (primary) hypertension: Secondary | ICD-10-CM | POA: Insufficient documentation

## 2015-12-12 DIAGNOSIS — Z7984 Long term (current) use of oral hypoglycemic drugs: Secondary | ICD-10-CM

## 2015-12-12 DIAGNOSIS — N182 Chronic kidney disease, stage 2 (mild): Secondary | ICD-10-CM

## 2015-12-12 DIAGNOSIS — C3492 Malignant neoplasm of unspecified part of left bronchus or lung: Secondary | ICD-10-CM | POA: Diagnosis not present

## 2015-12-12 DIAGNOSIS — E1122 Type 2 diabetes mellitus with diabetic chronic kidney disease: Secondary | ICD-10-CM | POA: Insufficient documentation

## 2015-12-12 DIAGNOSIS — I129 Hypertensive chronic kidney disease with stage 1 through stage 4 chronic kidney disease, or unspecified chronic kidney disease: Secondary | ICD-10-CM

## 2015-12-12 DIAGNOSIS — E119 Type 2 diabetes mellitus without complications: Secondary | ICD-10-CM

## 2015-12-12 DIAGNOSIS — J449 Chronic obstructive pulmonary disease, unspecified: Secondary | ICD-10-CM

## 2015-12-12 DIAGNOSIS — C3482 Malignant neoplasm of overlapping sites of left bronchus and lung: Secondary | ICD-10-CM

## 2015-12-12 DIAGNOSIS — Z801 Family history of malignant neoplasm of trachea, bronchus and lung: Secondary | ICD-10-CM

## 2015-12-12 DIAGNOSIS — Z87891 Personal history of nicotine dependence: Secondary | ICD-10-CM

## 2015-12-12 DIAGNOSIS — C7802 Secondary malignant neoplasm of left lung: Secondary | ICD-10-CM

## 2015-12-12 DIAGNOSIS — Z5111 Encounter for antineoplastic chemotherapy: Secondary | ICD-10-CM | POA: Diagnosis not present

## 2015-12-12 DIAGNOSIS — E039 Hypothyroidism, unspecified: Secondary | ICD-10-CM

## 2015-12-12 DIAGNOSIS — C7801 Secondary malignant neoplasm of right lung: Secondary | ICD-10-CM

## 2015-12-12 DIAGNOSIS — Z7901 Long term (current) use of anticoagulants: Secondary | ICD-10-CM

## 2015-12-12 DIAGNOSIS — E876 Hypokalemia: Secondary | ICD-10-CM | POA: Diagnosis not present

## 2015-12-12 DIAGNOSIS — I4891 Unspecified atrial fibrillation: Secondary | ICD-10-CM

## 2015-12-12 DIAGNOSIS — E785 Hyperlipidemia, unspecified: Secondary | ICD-10-CM

## 2015-12-12 DIAGNOSIS — I4892 Unspecified atrial flutter: Secondary | ICD-10-CM | POA: Insufficient documentation

## 2015-12-12 LAB — CBC WITH DIFFERENTIAL/PLATELET
BASOS ABS: 0 10*3/uL (ref 0–0.1)
Basophils Relative: 1 %
EOS ABS: 0.1 10*3/uL (ref 0–0.7)
EOS PCT: 3 %
HCT: 38.6 % (ref 35.0–47.0)
Hemoglobin: 12.5 g/dL (ref 12.0–16.0)
LYMPHS ABS: 0.7 10*3/uL — AB (ref 1.0–3.6)
LYMPHS PCT: 24 %
MCH: 29.6 pg (ref 26.0–34.0)
MCHC: 32.4 g/dL (ref 32.0–36.0)
MCV: 91.3 fL (ref 80.0–100.0)
MONO ABS: 0.3 10*3/uL (ref 0.2–0.9)
Monocytes Relative: 11 %
Neutro Abs: 1.8 10*3/uL (ref 1.4–6.5)
Neutrophils Relative %: 61 %
PLATELETS: 119 10*3/uL — AB (ref 150–440)
RBC: 4.23 MIL/uL (ref 3.80–5.20)
RDW: 17.8 % — AB (ref 11.5–14.5)
WBC: 2.9 10*3/uL — AB (ref 3.6–11.0)

## 2015-12-12 LAB — COMPREHENSIVE METABOLIC PANEL
ALT: 21 U/L (ref 14–54)
AST: 21 U/L (ref 15–41)
Albumin: 3.4 g/dL — ABNORMAL LOW (ref 3.5–5.0)
Alkaline Phosphatase: 52 U/L (ref 38–126)
Anion gap: 9 (ref 5–15)
BUN: 13 mg/dL (ref 6–20)
CHLORIDE: 99 mmol/L — AB (ref 101–111)
CO2: 32 mmol/L (ref 22–32)
Calcium: 8.7 mg/dL — ABNORMAL LOW (ref 8.9–10.3)
Creatinine, Ser: 0.61 mg/dL (ref 0.44–1.00)
Glucose, Bld: 169 mg/dL — ABNORMAL HIGH (ref 65–99)
POTASSIUM: 3 mmol/L — AB (ref 3.5–5.1)
SODIUM: 140 mmol/L (ref 135–145)
Total Bilirubin: 0.8 mg/dL (ref 0.3–1.2)
Total Protein: 6.9 g/dL (ref 6.5–8.1)

## 2015-12-12 MED ORDER — SODIUM CHLORIDE 0.9 % IV SOLN
750.0000 mg/m2 | Freq: Once | INTRAVENOUS | Status: AC
  Administered 2015-12-12: 1520 mg via INTRAVENOUS
  Filled 2015-12-12: qty 40

## 2015-12-12 MED ORDER — PROCHLORPERAZINE MALEATE 10 MG PO TABS
10.0000 mg | ORAL_TABLET | Freq: Once | ORAL | Status: AC
  Administered 2015-12-12: 10 mg via ORAL
  Filled 2015-12-12: qty 1

## 2015-12-12 MED ORDER — SODIUM CHLORIDE 0.9 % IV SOLN
Freq: Once | INTRAVENOUS | Status: AC
  Administered 2015-12-12: 10:00:00 via INTRAVENOUS
  Filled 2015-12-12: qty 1000

## 2015-12-17 NOTE — Progress Notes (Signed)
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  INTERVAL HISTORY: Patient returns to clinic today for further evaluation and consideration of cycle 1, day 8 of dose reduced carboplatinum and gemcitabine. Gemcitabine only today. She tolerated her first treatment well without significant side effects. She currently feels well and is asymptomatic. She has no neurologic complaints. She denies any recent fevers or illnesses. She has a good appetite and denies weight loss. She has no chest pain or shortness of breath. She denies any nausea, vomiting, constipation, or diarrhea. She has no urinary complaints. Patient offers no specific complaints today.   REVIEW OF SYSTEMS:   Review of Systems  Constitutional: Negative.  Negative for fever, weight loss and malaise/fatigue.  Respiratory: Negative for cough and shortness of breath.   Cardiovascular: Negative.  Negative for chest pain.  Gastrointestinal: Negative.  Negative for nausea, vomiting and abdominal pain.  Genitourinary: Negative.   Musculoskeletal: Negative.   Neurological: Negative.  Negative for weakness.  Psychiatric/Behavioral: Negative.  The patient is not nervous/anxious.     As per HPI. Otherwise, a complete review of systems is negatve.  PAST MEDICAL HISTORY: Past Medical History  Diagnosis Date  . Atrial fibrillation ([place])   . Thyroid disease   . Diabetes mellitus without complication ([place])   . Hypertension   . Hyperlipidemia   . Hypothyroidism   . COPD (chronic obstructive pulmonary disease) ([place])   . Lung cancer [facility]) 2015    Dr Kathrene Alu, chemo and rad    PAST SURGICAL HISTORY: Past Surgical History  Procedure Laterality  Date  . Left knee surgery Left   . Lung biopsy    . Colonoscopy  2006    Dr Raynelle Fanning patient ?polyps  . Joint replacement    . Colonoscopy with propofol N/A 01/30/2015    Procedure: COLONOSCOPY WITH PROPOFOL;  Surgeon: Manya Silvas, MD;  Location: [facility] ENDOSCOPY;  Service: Endoscopy;  Laterality: N/A;  . Esophagogastroduodenoscopy (egd) with propofol N/A 01/30/2015    Procedure: ESOPHAGOGASTRODUODENOSCOPY (EGD) WITH PROPOFOL;  Surgeon: Manya Silvas, MD;  Location: [facility] ENDOSCOPY;  Service: Endoscopy;  Laterality: N/A;    FAMILY HISTORY Family History  Problem Relation Age of Onset  . Heart attack Mother   . Lung cancer Father   . Heart attack Son 62       ADVANCED DIRECTIVES:    HEALTH MAINTENANCE: Social History  Substance Use Topics  . Smoking status: Former Smoker    Quit date: 10/29/2013  . Smokeless tobacco: Never Used  . Alcohol Use: No     Colonoscopy:  PAP:  Bone density:  Lipid panel:  Allergies  Allergen Reactions  . Aleve [Naproxen Sodium]     Pt states can tolerate some, but causes GI upset  . Celebrex [Celecoxib] Other (See Comments)    Pt states can handle some, but causes GI upset    Current Outpatient Prescriptions  Medication Sig Dispense Refill  . atorvastatin (LIPITOR) 10 MG tablet     . carvedilol (COREG) 6.25 MG tablet Take by mouth.    . Cholecalciferol (VITAMIN D3) 2000 units capsule Take by mouth.    . dexamethasone (DECADRON) 4 MG tablet     . furosemide (LASIX) 40 MG tablet     .  glucose blood test strip     . levothyroxine (SYNTHROID, LEVOTHROID) 50 MCG tablet     . losartan (COZAAR) 50 MG tablet     . Magnesium 250 MG TABS Take by mouth.    . metFORMIN (GLUCOPHAGE-XR) 500 MG 24 hr tablet     . pantoprazole (PROTONIX) 40 MG tablet     . prochlorperazine (COMPAZINE) 10 MG tablet Take by mouth.    . warfarin (COUMADIN) 4 MG tablet Take by mouth.     No current facility-administered medications for this visit.     OBJECTIVE: Filed Vitals:   12/12/15 0840  BP: 173/78  Pulse: 55  Temp: 97.5 F (36.4 C)     Body mass index is 36.13 kg/(m^2).    ECOG FS:0 - Asymptomatic  General: Well-developed, well-nourished, no acute distress. Eyes: Pink conjunctiva, anicteric sclera. Lungs: Clear to auscultation bilaterally. Heart: Regular rate and rhythm. No rubs, murmurs, or gallops. Abdomen: Soft, nontender, nondistended. No organomegaly noted, normoactive bowel sounds. Musculoskeletal: No edema, cyanosis, or clubbing. Neuro: Alert, answering all questions appropriately. Cranial nerves grossly intact. Skin: No rashes or petechiae noted. Psych: Normal affect.   LAB RESULTS:  Lab Results  Component Value Date   NA 140 12/12/2015   K 3.0* 12/12/2015   CL 99* 12/12/2015   CO2 32 12/12/2015   GLUCOSE 169* 12/12/2015   BUN 13 12/12/2015   CREATININE 0.61 12/12/2015   CALCIUM 8.7* 12/12/2015   PROT 6.9 12/12/2015   ALBUMIN 3.4* 12/12/2015   AST 21 12/12/2015   ALT 21 12/12/2015   ALKPHOS 52 12/12/2015   BILITOT 0.8 12/12/2015   GFRNONAA >60 12/12/2015   GFRAA >60 12/12/2015    Lab Results  Component Value Date   WBC 2.9* 12/12/2015   NEUTROABS 1.8 12/12/2015   HGB 12.5 12/12/2015   HCT 38.6 12/12/2015   MCV 91.3 12/12/2015   PLT 119* 12/12/2015   ONCOLOGIC HISTORY:   Patient was initially diagnosed with a 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 PET scan on August 25, 2012 revealed a hypermetabolic nodule 2.1 cm in the left apices. Bronchoscopy on August 31, 2012 did not show any evidence of malignancy, but CT-guided biopsy on September 18, 2012 confirmed squamous cell carcinoma. Patient was determined not to be a surgical candidate at that time and underwent SBRT in May 2014. CT scan on April 23, 2013 revealed an enlarging prevascular lymph node. PET scan on June 04, 2013 revealed a new enlarging left lobe paratracheal lymph node with no other evidence of metastatic disease.  Subsequent EBUS on June 13, 2013 confirmed recurrent squamous cell carcinoma. Patient underwent XRT along with concurrent navelbine between July 16, 2013 - August 28, 2013. Patient was then disease-free until CT scan on May 2,2017 revealed an interval increase of a nodule measuring up to 2.3 cm in the left lower lobe. PET scan on Oct 12, 1608 was hypermetabolic in the left upper, left lower and right middle lobe nodules concerning for neoplasm.  STUDIES: No results found.  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  PLAN:    1. 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  Proceed with cycle 1, day 8 of carboplatinum and gemcitabine. Gemcitabine only today. Patient will receive carboplatinum AUC 4 on day 1 and gemcitabine 750 mg/m on days 1 and 8. Immunotherapy was considered, but patient's PDL 1 is  less than 1%. Patient refuses port placement. Return to clinic in 2 weeks for consideration of cycle 2, day 1. Plan to re-image after cycle 4. 2. Hypertension: Patient's blood pressure is significantly elevated today, continue to monitor. Continue current medications. 3. Hypokalemia: Patient was previously given dietary recommendations.   Patient expressed understanding and was in agreement with this plan. She also understands that She can call clinic at any time with any questions, concerns, or complaints.    Lloyd Huger, MD   12/17/2015 8:16 AM

## 2015-12-26 ENCOUNTER — Inpatient Hospital Stay: Payer: Commercial Managed Care - HMO

## 2015-12-26 ENCOUNTER — Inpatient Hospital Stay (HOSPITAL_BASED_OUTPATIENT_CLINIC_OR_DEPARTMENT_OTHER): Payer: Commercial Managed Care - HMO | Admitting: Oncology

## 2015-12-26 VITALS — BP 187/47 | HR 53 | Temp 96.5°F | Resp 18 | Wt 203.7 lb

## 2015-12-26 VITALS — BP 160/83 | HR 56 | Resp 18

## 2015-12-26 DIAGNOSIS — C7801 Secondary malignant neoplasm of right lung: Secondary | ICD-10-CM

## 2015-12-26 DIAGNOSIS — I4891 Unspecified atrial fibrillation: Secondary | ICD-10-CM

## 2015-12-26 DIAGNOSIS — E039 Hypothyroidism, unspecified: Secondary | ICD-10-CM

## 2015-12-26 DIAGNOSIS — C3412 Malignant neoplasm of upper lobe, left bronchus or lung: Secondary | ICD-10-CM

## 2015-12-26 DIAGNOSIS — E119 Type 2 diabetes mellitus without complications: Secondary | ICD-10-CM

## 2015-12-26 DIAGNOSIS — Z79899 Other long term (current) drug therapy: Secondary | ICD-10-CM

## 2015-12-26 DIAGNOSIS — Z7901 Long term (current) use of anticoagulants: Secondary | ICD-10-CM

## 2015-12-26 DIAGNOSIS — Z87891 Personal history of nicotine dependence: Secondary | ICD-10-CM

## 2015-12-26 DIAGNOSIS — C3492 Malignant neoplasm of unspecified part of left bronchus or lung: Secondary | ICD-10-CM

## 2015-12-26 DIAGNOSIS — C3482 Malignant neoplasm of overlapping sites of left bronchus and lung: Secondary | ICD-10-CM

## 2015-12-26 DIAGNOSIS — Z801 Family history of malignant neoplasm of trachea, bronchus and lung: Secondary | ICD-10-CM

## 2015-12-26 DIAGNOSIS — E785 Hyperlipidemia, unspecified: Secondary | ICD-10-CM

## 2015-12-26 DIAGNOSIS — J449 Chronic obstructive pulmonary disease, unspecified: Secondary | ICD-10-CM

## 2015-12-26 DIAGNOSIS — Z5111 Encounter for antineoplastic chemotherapy: Secondary | ICD-10-CM | POA: Diagnosis not present

## 2015-12-26 DIAGNOSIS — C7802 Secondary malignant neoplasm of left lung: Secondary | ICD-10-CM

## 2015-12-26 DIAGNOSIS — Z7984 Long term (current) use of oral hypoglycemic drugs: Secondary | ICD-10-CM

## 2015-12-26 DIAGNOSIS — E876 Hypokalemia: Secondary | ICD-10-CM

## 2015-12-26 DIAGNOSIS — I10 Essential (primary) hypertension: Secondary | ICD-10-CM

## 2015-12-26 LAB — COMPREHENSIVE METABOLIC PANEL
ALK PHOS: 54 U/L (ref 38–126)
ALT: 18 U/L (ref 14–54)
AST: 19 U/L (ref 15–41)
Albumin: 3.4 g/dL — ABNORMAL LOW (ref 3.5–5.0)
Anion gap: 9 (ref 5–15)
BUN: 14 mg/dL (ref 6–20)
CALCIUM: 8.7 mg/dL — AB (ref 8.9–10.3)
CO2: 31 mmol/L (ref 22–32)
CREATININE: 0.65 mg/dL (ref 0.44–1.00)
Chloride: 100 mmol/L — ABNORMAL LOW (ref 101–111)
GFR calc non Af Amer: >60 mL/min (ref >60)
Glucose, Bld: 161 mg/dL — ABNORMAL HIGH (ref 65–99)
Potassium: 3.4 mmol/L — ABNORMAL LOW (ref 3.5–5.1)
SODIUM: 140 mmol/L (ref 135–145)
Total Bilirubin: 0.4 mg/dL (ref 0.3–1.2)
Total Protein: 6.9 g/dL (ref 6.5–8.1)

## 2015-12-26 LAB — CBC WITH DIFFERENTIAL/PLATELET
Basophils Absolute: 0 10*3/uL (ref 0–0.1)
Basophils Relative: 1 %
Eosinophils Absolute: 0.1 10*3/uL (ref 0–0.7)
Eosinophils Relative: 2 %
HCT: 37.3 % (ref 35.0–47.0)
HEMOGLOBIN: 12.2 g/dL (ref 12.0–16.0)
LYMPHS ABS: 0.7 10*3/uL — AB (ref 1.0–3.6)
LYMPHS PCT: 19 %
MCH: 29.9 pg (ref 26.0–34.0)
MCHC: 32.7 g/dL (ref 32.0–36.0)
MCV: 91.5 fL (ref 80.0–100.0)
Monocytes Absolute: 0.7 10*3/uL (ref 0.2–0.9)
Monocytes Relative: 19 %
NEUTROS PCT: 59 %
Neutro Abs: 2.2 10*3/uL (ref 1.4–6.5)
Platelets: 269 10*3/uL (ref 150–440)
RBC: 4.08 MIL/uL (ref 3.80–5.20)
RDW: 18.2 % — ABNORMAL HIGH (ref 11.5–14.5)
WBC: 3.7 10*3/uL (ref 3.6–11.0)

## 2015-12-26 MED ORDER — GEMCITABINE HCL CHEMO INJECTION 1 GM/26.3ML
750.0000 mg/m2 | Freq: Once | INTRAVENOUS | Status: AC
  Administered 2015-12-26: 1520 mg via INTRAVENOUS
  Filled 2015-12-26: qty 40

## 2015-12-26 MED ORDER — SODIUM CHLORIDE 0.9 % IV SOLN
Freq: Once | INTRAVENOUS | Status: AC
  Administered 2015-12-26: 10:00:00 via INTRAVENOUS
  Filled 2015-12-26: qty 1000

## 2015-12-26 MED ORDER — PALONOSETRON HCL INJECTION 0.25 MG/5ML
0.2500 mg | Freq: Once | INTRAVENOUS | Status: AC
  Administered 2015-12-26: 0.25 mg via INTRAVENOUS
  Filled 2015-12-26: qty 5

## 2015-12-26 MED ORDER — SODIUM CHLORIDE 0.9 % IV SOLN
10.0000 mg | Freq: Once | INTRAVENOUS | Status: AC
  Administered 2015-12-26: 10 mg via INTRAVENOUS
  Filled 2015-12-26: qty 1

## 2015-12-26 MED ORDER — SODIUM CHLORIDE 0.9 % IV SOLN
381.2000 mg | Freq: Once | INTRAVENOUS | Status: AC
  Administered 2015-12-26: 380 mg via INTRAVENOUS
  Filled 2015-12-26: qty 38

## 2015-12-26 NOTE — Progress Notes (Signed)
States is feeling well. Offers no complaints. 

## 2015-12-26 NOTE — Progress Notes (Signed)
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  INTERVAL HISTORY: Patient returns to clinic today for further evaluation and consideration of cycle 2, day 1 of dose reduced carboplatinum and gemcitabine. She currently feels well and is asymptomatic. She has no neurologic complaints. She denies any recent fevers or illnesses. She has a good appetite and denies weight loss. She has no chest pain or shortness of breath. She denies any nausea, vomiting, constipation, or diarrhea. She has no urinary complaints. Patient offers no specific complaints today.   REVIEW OF SYSTEMS:   Review of Systems  Constitutional: Negative.  Negative for fever, malaise/fatigue and weight loss.  Respiratory: Negative for cough and shortness of breath.   Cardiovascular: Negative.  Negative for chest pain.  Gastrointestinal: Negative.  Negative for abdominal pain, nausea and vomiting.  Genitourinary: Negative.   Musculoskeletal: Negative.   Neurological: Negative.  Negative for weakness.  Psychiatric/Behavioral: Negative.  The patient is not nervous/anxious.     As per HPI. Otherwise, a complete review of systems is negatve.  PAST MEDICAL HISTORY: Past Medical History:  Diagnosis Date  . Atrial fibrillation ([place])   . COPD (chronic obstructive pulmonary disease) ([place])   . Diabetes mellitus without complication ([place])   . Hyperlipidemia   . Hypertension   . Hypothyroidism   . Lung cancer [name]) 2015   Dr Merleen Nicely [facility], chemo and rad  . Thyroid disease     PAST SURGICAL HISTORY: Past Surgical History:  Procedure Laterality Date  . COLONOSCOPY  2006   Dr Raynelle Fanning patient ?polyps  . COLONOSCOPY WITH PROPOFOL N/A  01/30/2015   Procedure: COLONOSCOPY WITH PROPOFOL;  Surgeon: Manya Silvas, MD;  Location: [facility] ENDOSCOPY;  Service: Endoscopy;  Laterality: N/A;  . ESOPHAGOGASTRODUODENOSCOPY (EGD) WITH PROPOFOL N/A 01/30/2015   Procedure: ESOPHAGOGASTRODUODENOSCOPY (EGD) WITH PROPOFOL;  Surgeon: Manya Silvas, MD;  Location: [facility] ENDOSCOPY;  Service: Endoscopy;  Laterality: N/A;  . JOINT REPLACEMENT    . Left knee surgery Left   . LUNG BIOPSY      FAMILY HISTORY Family History  Problem Relation Age of Onset  . Heart attack Mother   . Lung cancer Father   . Heart attack Son 34       ADVANCED DIRECTIVES:    HEALTH MAINTENANCE: Social History  Substance Use Topics  . Smoking status: Former Smoker    Quit date: 10/29/2013  . Smokeless tobacco: Never Used  . Alcohol use No     Colonoscopy:  PAP:  Bone density:  Lipid panel:  Allergies  Allergen Reactions  . Aleve [Naproxen Sodium]     Pt states can tolerate some, but causes GI upset  . Celebrex [Celecoxib] Other (See Comments)    Pt states can handle some, but causes GI upset    Current Outpatient Prescriptions  Medication Sig Dispense Refill  . atorvastatin (LIPITOR) 10 MG tablet Take 10 mg by mouth daily at 6 PM.     . carvedilol (COREG) 6.25 MG tablet Take 6.25 mg by mouth 2 (two) times daily with a meal.     . Cholecalciferol (VITAMIN D3) 2000 units capsule Take 2,000 Units by mouth daily.     Marland Kitchen dexamethasone (DECADRON) 4 MG tablet Take 8 mg by mouth daily. Take  for 2 days after [place] chemotherapy, then as directed.    . furosemide (LASIX) 40 MG tablet Take 40 mg by mouth daily.     Marland Kitchen levothyroxine (SYNTHROID, LEVOTHROID) 50 MCG tablet Take 50 mcg by mouth 2 (two) times daily.     Marland Kitchen losartan (COZAAR) 50 MG tablet Take 50 mg by mouth daily.     . Magnesium 250 MG TABS Take 1 tablet by mouth daily.     . metFORMIN (GLUCOPHAGE-XR) 500 MG 24 hr tablet Take 1,000 mg by mouth daily with breakfast.     . pantoprazole (PROTONIX) 40 MG  tablet Take 40 mg by mouth daily.     . prochlorperazine (COMPAZINE) 10 MG tablet Take 10 mg by mouth every 6 (six) hours as needed.     . warfarin (COUMADIN) 4 MG tablet Take 6 mg by mouth one time only at 6 PM.      No current facility-administered medications for this visit.     OBJECTIVE: Vitals:   12/26/15 0923  BP: (!) 187/47  Pulse: (!) 53  Resp: 18  Temp: (!) 96.5 F (35.8 C)     Body mass index is 36.08 kg/m.    ECOG FS:0 - Asymptomatic  General: Well-developed, well-nourished, no acute distress. Eyes: Pink conjunctiva, anicteric sclera. Lungs: Clear to auscultation bilaterally. Heart: Regular rate and rhythm. No rubs, murmurs, or gallops. Abdomen: Soft, nontender, nondistended. No organomegaly noted, normoactive bowel sounds. Musculoskeletal: No edema, cyanosis, or clubbing. Neuro: Alert, answering all questions appropriately. Cranial nerves grossly intact. Skin: No rashes or petechiae noted. Psych: Normal affect.   LAB RESULTS:  Lab Results  Component Value Date   NA 140 12/26/2015   K 3.4 (L) 12/26/2015   CL 100 (L) 12/26/2015   CO2 31 12/26/2015   GLUCOSE 161 (H) 12/26/2015   BUN 14 12/26/2015   CREATININE 0.65 12/26/2015   CALCIUM 8.7 (L) 12/26/2015   PROT 6.9 12/26/2015   ALBUMIN 3.4 (L) 12/26/2015   AST 19 12/26/2015   ALT 18 12/26/2015   ALKPHOS 54 12/26/2015   BILITOT 0.4 12/26/2015   GFRNONAA >60 12/26/2015   GFRAA >60 12/26/2015    Lab Results  Component Value Date   WBC 3.7 12/26/2015   NEUTROABS 2.2 12/26/2015   HGB 12.2 12/26/2015   HCT 37.3 12/26/2015   MCV 91.5 12/26/2015   PLT 269 12/26/2015   ONCOLOGIC HISTORY:   Patient was initially diagnosed with a 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 PET scan on August 25, 2012 revealed a hypermetabolic nodule 2.1 cm in the left apices. Bronchoscopy on August 31, 2012 did not show any evidence of malignancy, but CT-guided biopsy on September 18, 2012 confirmed squamous cell carcinoma.  Patient was determined not to be a surgical candidate at that time and underwent SBRT in May 2014. CT scan on April 23, 2013 revealed an enlarging prevascular lymph node. PET scan on June 04, 2013 revealed a new enlarging left lobe paratracheal lymph node with no other evidence of metastatic disease. Subsequent EBUS on June 13, 2013 confirmed recurrent squamous cell carcinoma. Patient underwent XRT along with concurrent navelbine between July 16, 2013 - August 28, 2013. Patient was then disease-free until CT scan on May 2,2017 revealed an interval increase of a nodule measuring up to 2.3 cm in the left lower lobe. PET scan on Oct 12, 8099 was hypermetabolic in the left upper, left lower and right middle lobe nodules concerning for neoplasm.  STUDIES: No results  found.  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  PLAN:    1. 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  Proceed with cycle 2, day 1 of carboplatinum and gemcitabine. Patient will receive carboplatinum AUC 4 on day 1 and gemcitabine 750 mg/m on days 1 and 8. Immunotherapy was considered, but patient's PDL 1 is less than 1%. Patient refuses port placement. Return to clinic in 1 week for consideration of cycle 2, day 8. Plan to re-image after cycle 4. 2. Hypertension: Patient's blood pressure is significantly elevated today, continue to monitor. Continue current medications. 3. Hypokalemia: Patient was previously given dietary recommendations.   Patient expressed understanding and was in agreement with this plan. She also understands that She can call clinic at any time with any questions, concerns, or complaints.    Lloyd Huger, MD   12/26/2015 9:56 PM

## 2016-01-02 ENCOUNTER — Inpatient Hospital Stay: Payer: Commercial Managed Care - HMO

## 2016-01-02 ENCOUNTER — Telehealth: Payer: Self-pay | Admitting: Pharmacist

## 2016-01-02 ENCOUNTER — Inpatient Hospital Stay (HOSPITAL_BASED_OUTPATIENT_CLINIC_OR_DEPARTMENT_OTHER): Payer: Commercial Managed Care - HMO | Admitting: Oncology

## 2016-01-02 ENCOUNTER — Inpatient Hospital Stay: Payer: Commercial Managed Care - HMO | Attending: Oncology

## 2016-01-02 VITALS — BP 172/89 | HR 64 | Temp 98.1°F | Resp 17 | Ht 63.0 in | Wt 204.6 lb

## 2016-01-02 DIAGNOSIS — D701 Agranulocytosis secondary to cancer chemotherapy: Secondary | ICD-10-CM | POA: Diagnosis not present

## 2016-01-02 DIAGNOSIS — C7801 Secondary malignant neoplasm of right lung: Secondary | ICD-10-CM | POA: Insufficient documentation

## 2016-01-02 DIAGNOSIS — Z87891 Personal history of nicotine dependence: Secondary | ICD-10-CM

## 2016-01-02 DIAGNOSIS — D6481 Anemia due to antineoplastic chemotherapy: Secondary | ICD-10-CM | POA: Diagnosis not present

## 2016-01-02 DIAGNOSIS — E039 Hypothyroidism, unspecified: Secondary | ICD-10-CM | POA: Insufficient documentation

## 2016-01-02 DIAGNOSIS — E876 Hypokalemia: Secondary | ICD-10-CM | POA: Insufficient documentation

## 2016-01-02 DIAGNOSIS — E119 Type 2 diabetes mellitus without complications: Secondary | ICD-10-CM | POA: Insufficient documentation

## 2016-01-02 DIAGNOSIS — T451X5S Adverse effect of antineoplastic and immunosuppressive drugs, sequela: Secondary | ICD-10-CM | POA: Diagnosis not present

## 2016-01-02 DIAGNOSIS — C7802 Secondary malignant neoplasm of left lung: Secondary | ICD-10-CM | POA: Insufficient documentation

## 2016-01-02 DIAGNOSIS — C3492 Malignant neoplasm of unspecified part of left bronchus or lung: Secondary | ICD-10-CM

## 2016-01-02 DIAGNOSIS — J449 Chronic obstructive pulmonary disease, unspecified: Secondary | ICD-10-CM | POA: Insufficient documentation

## 2016-01-02 DIAGNOSIS — Z5111 Encounter for antineoplastic chemotherapy: Secondary | ICD-10-CM | POA: Insufficient documentation

## 2016-01-02 DIAGNOSIS — I10 Essential (primary) hypertension: Secondary | ICD-10-CM | POA: Insufficient documentation

## 2016-01-02 DIAGNOSIS — C3482 Malignant neoplasm of overlapping sites of left bronchus and lung: Secondary | ICD-10-CM

## 2016-01-02 DIAGNOSIS — Z7984 Long term (current) use of oral hypoglycemic drugs: Secondary | ICD-10-CM | POA: Diagnosis not present

## 2016-01-02 DIAGNOSIS — E785 Hyperlipidemia, unspecified: Secondary | ICD-10-CM | POA: Diagnosis not present

## 2016-01-02 DIAGNOSIS — Z7901 Long term (current) use of anticoagulants: Secondary | ICD-10-CM | POA: Diagnosis not present

## 2016-01-02 DIAGNOSIS — C3412 Malignant neoplasm of upper lobe, left bronchus or lung: Secondary | ICD-10-CM | POA: Insufficient documentation

## 2016-01-02 DIAGNOSIS — Z801 Family history of malignant neoplasm of trachea, bronchus and lung: Secondary | ICD-10-CM

## 2016-01-02 DIAGNOSIS — I4891 Unspecified atrial fibrillation: Secondary | ICD-10-CM | POA: Insufficient documentation

## 2016-01-02 DIAGNOSIS — Z79899 Other long term (current) drug therapy: Secondary | ICD-10-CM | POA: Diagnosis not present

## 2016-01-02 LAB — COMPREHENSIVE METABOLIC PANEL
ALT: 32 U/L (ref 14–54)
AST: 29 U/L (ref 15–41)
Albumin: 3.6 g/dL (ref 3.5–5.0)
Alkaline Phosphatase: 51 U/L (ref 38–126)
Anion gap: 6 (ref 5–15)
BILIRUBIN TOTAL: 0.5 mg/dL (ref 0.3–1.2)
BUN: 11 mg/dL (ref 6–20)
CO2: 34 mmol/L — ABNORMAL HIGH (ref 22–32)
CREATININE: 0.62 mg/dL (ref 0.44–1.00)
Calcium: 9 mg/dL (ref 8.9–10.3)
Chloride: 100 mmol/L — ABNORMAL LOW (ref 101–111)
GFR calc Af Amer: >60 mL/min (ref >60)
Glucose, Bld: 156 mg/dL — ABNORMAL HIGH (ref 65–99)
POTASSIUM: 3.7 mmol/L (ref 3.5–5.1)
Sodium: 140 mmol/L (ref 135–145)
TOTAL PROTEIN: 7.2 g/dL (ref 6.5–8.1)

## 2016-01-02 LAB — CBC WITH DIFFERENTIAL/PLATELET
BASOS ABS: 0 10*3/uL (ref 0–0.1)
Basophils Relative: 1 %
Eosinophils Absolute: 0 10*3/uL (ref 0–0.7)
Eosinophils Relative: 1 %
HEMATOCRIT: 36.7 % (ref 35.0–47.0)
Hemoglobin: 12.1 g/dL (ref 12.0–16.0)
Lymphocytes Relative: 28 %
Lymphs Abs: 0.7 10*3/uL — ABNORMAL LOW (ref 1.0–3.6)
MCH: 30.3 pg (ref 26.0–34.0)
MCHC: 32.9 g/dL (ref 32.0–36.0)
MCV: 92.3 fL (ref 80.0–100.0)
MONO ABS: 0.4 10*3/uL (ref 0.2–0.9)
Monocytes Relative: 15 %
Neutro Abs: 1.3 10*3/uL — ABNORMAL LOW (ref 1.4–6.5)
Platelets: 215 10*3/uL (ref 150–440)
RBC: 3.97 MIL/uL (ref 3.80–5.20)
RDW: 17.9 % — AB (ref 11.5–14.5)
WBC: 2.4 10*3/uL — AB (ref 3.6–11.0)

## 2016-01-02 MED ORDER — SODIUM CHLORIDE 0.9 % IV SOLN
750.0000 mg/m2 | Freq: Once | INTRAVENOUS | Status: AC
  Administered 2016-01-02: 1520 mg via INTRAVENOUS
  Filled 2016-01-02: qty 40

## 2016-01-02 MED ORDER — PROCHLORPERAZINE MALEATE 10 MG PO TABS
10.0000 mg | ORAL_TABLET | Freq: Once | ORAL | Status: DC
  Filled 2016-01-02: qty 1

## 2016-01-02 MED ORDER — SODIUM CHLORIDE 0.9 % IV SOLN
Freq: Once | INTRAVENOUS | Status: AC
  Administered 2016-01-02: 11:00:00 via INTRAVENOUS
  Filled 2016-01-02: qty 1000

## 2016-01-02 NOTE — Progress Notes (Signed)
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  INTERVAL HISTORY: Patient returns to clinic today for further evaluation and consideration of cycle 2, day 8 of dose reduced carboplatinum and gemcitabine. She currently feels well and is asymptomatic. She has no neurologic complaints. She denies any recent fevers or illnesses. She has a good appetite and denies weight loss. She has no chest pain or shortness of breath. She denies any nausea, vomiting, constipation, or diarrhea. She has no urinary complaints. Patient offers no specific complaints today.   REVIEW OF SYSTEMS:   Review of Systems  Constitutional: Negative.  Negative for fever, malaise/fatigue and weight loss.  Respiratory: Negative for cough and shortness of breath.   Cardiovascular: Negative.  Negative for chest pain.  Gastrointestinal: Negative.  Negative for abdominal pain, nausea and vomiting.  Genitourinary: Negative.   Musculoskeletal: Negative.   Neurological: Negative.  Negative for weakness.  Psychiatric/Behavioral: Negative.  The patient is not nervous/anxious.     As per HPI. Otherwise, a complete review of systems is negatve.  PAST MEDICAL HISTORY: Past Medical History:  Diagnosis Date  . Atrial fibrillation ([place])   . COPD (chronic obstructive pulmonary disease) ([place])   . Diabetes mellitus without complication ([place])   . Hyperlipidemia   . Hypertension   . Hypothyroidism   . Lung cancer [facility]) 2015   Dr Merleen Nicely [facility], chemo and rad  . Thyroid disease     PAST SURGICAL HISTORY: Past Surgical History:  Procedure Laterality Date  . COLONOSCOPY  2006   Dr Raynelle Fanning patient ?polyps  . COLONOSCOPY WITH PROPOFOL N/A  01/30/2015   Procedure: COLONOSCOPY WITH PROPOFOL;  Surgeon: Manya Silvas, MD;  Location: [facility] ENDOSCOPY;  Service: Endoscopy;  Laterality: N/A;  . ESOPHAGOGASTRODUODENOSCOPY (EGD) WITH PROPOFOL N/A 01/30/2015   Procedure: ESOPHAGOGASTRODUODENOSCOPY (EGD) WITH PROPOFOL;  Surgeon: Manya Silvas, MD;  Location: [facility] ENDOSCOPY;  Service: Endoscopy;  Laterality: N/A;  . JOINT REPLACEMENT    . Left knee surgery Left   . LUNG BIOPSY      FAMILY HISTORY Family History  Problem Relation Age of Onset  . Heart attack Mother   . Lung cancer Father   . Heart attack Son 69       ADVANCED DIRECTIVES:    HEALTH MAINTENANCE: Social History  Substance Use Topics  . Smoking status: Former Smoker    Quit date: 10/29/2013  . Smokeless tobacco: Never Used  . Alcohol use No     Colonoscopy:  PAP:  Bone density:  Lipid panel:  Allergies  Allergen Reactions  . Aleve [Naproxen Sodium]     Pt states can tolerate some, but causes GI upset  . Celebrex [Celecoxib] Other (See Comments)    Pt states can handle some, but causes GI upset    Current Outpatient Prescriptions  Medication Sig Dispense Refill  . atorvastatin (LIPITOR) 10 MG tablet Take 10 mg by mouth daily at 6 PM.     . carvedilol (COREG) 6.25 MG tablet Take 6.25 mg by mouth 2 (two) times daily with a meal.     . Cholecalciferol (VITAMIN D3) 2000 units capsule Take 2,000 Units by mouth daily.     Marland Kitchen dexamethasone (DECADRON) 4 MG tablet Take 8 mg by mouth daily. Take  for 2 days after [place] chemotherapy, then as directed.    . furosemide (LASIX) 40 MG tablet Take 40 mg by mouth daily.     Marland Kitchen levothyroxine (SYNTHROID, LEVOTHROID) 50 MCG tablet Take 50 mcg by mouth 2 (two) times daily.     Marland Kitchen losartan (COZAAR) 50 MG tablet Take 50 mg by mouth daily.     . Magnesium 250 MG TABS Take 1 tablet by mouth daily.     . metFORMIN (GLUCOPHAGE-XR) 500 MG 24 hr tablet Take 1,000 mg by mouth daily with breakfast.     . pantoprazole (PROTONIX) 40 MG  tablet Take 40 mg by mouth daily.     . prochlorperazine (COMPAZINE) 10 MG tablet Take 10 mg by mouth every 6 (six) hours as needed.     . warfarin (COUMADIN) 4 MG tablet Take 6 mg by mouth one time only at 6 PM.      No current facility-administered medications for this visit.     OBJECTIVE: Vitals:   01/02/16 1003 01/02/16 1025  BP: (!) 185/97 (!) 172/89  Pulse: 60 64  Resp: 17   Temp: (!) 96.2 F (35.7 C) 98.1 F (36.7 C)     Body mass index is 36.24 kg/m.    ECOG FS:0 - Asymptomatic  General: Well-developed, well-nourished, no acute distress. Eyes: Pink conjunctiva, anicteric sclera. Lungs: Clear to auscultation bilaterally. Heart: Regular rate and rhythm. No rubs, murmurs, or gallops. Abdomen: Soft, nontender, nondistended. No organomegaly noted, normoactive bowel sounds. Musculoskeletal: No edema, cyanosis, or clubbing. Neuro: Alert, answering all questions appropriately. Cranial nerves grossly intact. Skin: No rashes or petechiae noted. Psych: Normal affect.   LAB RESULTS:  Lab Results  Component Value Date   NA 140 01/02/2016   K 3.7 01/02/2016   CL 100 (L) 01/02/2016   CO2 34 (H) 01/02/2016   GLUCOSE 156 (H) 01/02/2016   BUN 11 01/02/2016   CREATININE 0.62 01/02/2016   CALCIUM 9.0 01/02/2016   PROT 7.2 01/02/2016   ALBUMIN 3.6 01/02/2016   AST 29 01/02/2016   ALT 32 01/02/2016   ALKPHOS 51 01/02/2016   BILITOT 0.5 01/02/2016   GFRNONAA >60 01/02/2016   GFRAA >60 01/02/2016    Lab Results  Component Value Date   WBC 2.4 (L) 01/02/2016   NEUTROABS 1.3 (L) 01/02/2016   HGB 12.1 01/02/2016   HCT 36.7 01/02/2016   MCV 92.3 01/02/2016   PLT 215 01/02/2016   ONCOLOGIC HISTORY:   Patient was initially diagnosed with a 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 PET scan on August 25, 2012 revealed a hypermetabolic nodule 2.1 cm in the left apices. Bronchoscopy on August 31, 2012 did not show any evidence of malignancy, but CT-guided biopsy on September 18, 2012 confirmed squamous cell carcinoma. Patient was determined not to be a surgical candidate at that time and underwent SBRT in May 2014. CT scan on April 23, 2013 revealed an enlarging prevascular lymph node. PET scan on June 04, 2013 revealed a new enlarging left lobe paratracheal lymph node with no other evidence of metastatic disease. Subsequent EBUS on June 13, 2013 confirmed recurrent squamous cell carcinoma. Patient underwent XRT along with concurrent navelbine between July 16, 2013 - August 28, 2013. Patient was then disease-free until CT scan on May 2,2017 revealed an interval increase of a nodule measuring up to 2.3 cm in the left lower lobe. PET scan on Oct 13, 4494 was hypermetabolic in the left upper, left lower and right middle  lobe nodules concerning for neoplasm.  STUDIES: No results found.  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  PLAN:    1. 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  Proceed with cycle 2, day 8 of carboplatinum and gemcitabine. Gemcitabine only today. Patient will receive carboplatinum AUC 4 on day 1 and gemcitabine 750 mg/m on days 1 and 8. Immunotherapy was considered, but patient's PDL 1 is less than 1%. Patient refuses port placement. Return to clinic in 2 weeks for consideration of cycle 3, day 1. Plan to re-image after cycle 4. 2. Hypertension: Patient's blood pressure is significantly elevated today, continue to monitor. Continue current medications. Patient reports she did not take her blood pressure medications as prescribed today. 3. Hypokalemia: Resolved. Patient was previously given dietary recommendations.  4.. Anemia: Secondary chemotherapy, monitor.  Patient expressed understanding and was in agreement with this plan. She also understands that She can call clinic at any time with any questions,  concerns, or complaints.    Lloyd Huger, MD   01/02/2016 5:58 PM

## 2016-01-02 NOTE — Telephone Encounter (Signed)
MD aware of BP 185/97. Has improved since arrival to clinic.

## 2016-01-02 NOTE — Progress Notes (Signed)
Pt has no concerns.  MD will see pt in infusion.

## 2016-01-15 NOTE — Progress Notes (Signed)
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  INTERVAL HISTORY: Patient returns to clinic today for further evaluation and consideration of cycle 3, day 1 of dose reduced carboplatinum and gemcitabine. She continues to feel well and is asymptomatic. She has no neurologic complaints. She denies any recent fevers or illnesses. She has a good appetite and denies weight loss. She has no chest pain or shortness of breath. She denies any nausea, vomiting, constipation, or diarrhea. She has no urinary complaints. Patient offers no specific complaints today.   REVIEW OF SYSTEMS:   Review of Systems  Constitutional: Negative.  Negative for fever, malaise/fatigue and weight loss.  Respiratory: Negative for cough and shortness of breath.   Cardiovascular: Negative.  Negative for chest pain.  Gastrointestinal: Negative.  Negative for abdominal pain, nausea and vomiting.  Genitourinary: Negative.   Musculoskeletal: Negative.   Neurological: Negative.  Negative for weakness.  Psychiatric/Behavioral: Negative.  The patient is not nervous/anxious.     As per HPI. Otherwise, a complete review of systems is negatve.  PAST MEDICAL HISTORY: Past Medical History:  Diagnosis Date  . Atrial fibrillation ([place])   . COPD (chronic obstructive pulmonary disease) ([place])   . Diabetes mellitus without complication ([place])   . Hyperlipidemia   . Hypertension   . Hypothyroidism   . Lung cancer [facility]) 2015   Dr Merleen Nicely [facility], chemo and rad  . Thyroid disease     PAST SURGICAL HISTORY: Past Surgical History:  Procedure Laterality Date  . COLONOSCOPY  2006   Dr Raynelle Fanning patient ?polyps  . COLONOSCOPY WITH PROPOFOL N/A  01/30/2015   Procedure: COLONOSCOPY WITH PROPOFOL;  Surgeon: Manya Silvas, MD;  Location: [facility] ENDOSCOPY;  Service: Endoscopy;  Laterality: N/A;  . ESOPHAGOGASTRODUODENOSCOPY (EGD) WITH PROPOFOL N/A 01/30/2015   Procedure: ESOPHAGOGASTRODUODENOSCOPY (EGD) WITH PROPOFOL;  Surgeon: Manya Silvas, MD;  Location: [facility] ENDOSCOPY;  Service: Endoscopy;  Laterality: N/A;  . JOINT REPLACEMENT    . Left knee surgery Left   . LUNG BIOPSY      FAMILY HISTORY Family History  Problem Relation Age of Onset  . Heart attack Mother   . Lung cancer Father   . Heart attack Son 64       ADVANCED DIRECTIVES:    HEALTH MAINTENANCE: Social History  Substance Use Topics  . Smoking status: Former Smoker    Quit date: 10/29/2013  . Smokeless tobacco: Never Used  . Alcohol use No     Colonoscopy:  PAP:  Bone density:  Lipid panel:  Allergies  Allergen Reactions  . Aleve [Naproxen Sodium]     Pt states can tolerate some, but causes GI upset  . Celebrex [Celecoxib] Other (See Comments)    Pt states can handle some, but causes GI upset    Current Outpatient Prescriptions  Medication Sig Dispense Refill  . atorvastatin (LIPITOR) 10 MG tablet Take 10 mg by mouth daily at 6 PM.     . carvedilol (COREG) 6.25 MG tablet Take 6.25 mg by mouth 2 (two) times daily with a meal.     . Cholecalciferol (VITAMIN D3) 2000 units capsule Take 2,000 Units by mouth daily.     Marland Kitchen dexamethasone (DECADRON) 4 MG tablet Take 8 mg by mouth daily.  Take for 2 days after [place] chemotherapy, then as directed.    . empagliflozin (JARDIANCE) 10 MG TABS tablet Take 10 mg by mouth daily.    . furosemide (LASIX) 40 MG tablet Take 40 mg by mouth daily.     Marland Kitchen levothyroxine (SYNTHROID, LEVOTHROID) 50 MCG tablet Take 50 mcg by mouth 2 (two) times daily.     Marland Kitchen losartan (COZAAR) 50 MG tablet Take 50 mg by mouth daily.     . Magnesium 250 MG TABS Take 1 tablet by mouth daily.     . metFORMIN (GLUCOPHAGE-XR) 500 MG 24 hr tablet Take  1,000 mg by mouth daily with breakfast.     . pantoprazole (PROTONIX) 40 MG tablet Take 40 mg by mouth daily.     . prochlorperazine (COMPAZINE) 10 MG tablet Take 10 mg by mouth every 6 (six) hours as needed.     . warfarin (COUMADIN) 4 MG tablet Take 6 mg by mouth one time only at 6 PM.      No current facility-administered medications for this visit.    Facility-Administered Medications Ordered in Other Visits  Medication Dose Route Frequency Provider Last Rate Last Dose  . 0.9 %  sodium chloride infusion   Intravenous Once Lloyd Huger, MD      . CARBOplatin (PARAPLATIN) 380 mg in sodium chloride 0.9 % 100 mL chemo infusion  380 mg Intravenous Once Lloyd Huger, MD      . dexamethasone (DECADRON) 10 mg in sodium chloride 0.9 % 50 mL IVPB  10 mg Intravenous Once Lloyd Huger, MD      . gemcitabine (GEMZAR) 1,520 mg in sodium chloride 0.9 % 100 mL chemo infusion  750 mg/m2 (Order-Specific) Intravenous Once Lloyd Huger, MD      . palonosetron (ALOXI) injection 0.25 mg  0.25 mg Intravenous Once Lloyd Huger, MD        OBJECTIVE: Vitals:   01/16/16 0846  BP: (!) 191/75  Pulse: 65  Resp: 18  Temp: (!) 96.6 F (35.9 C)     Body mass index is 36.07 kg/m.    ECOG FS:0 - Asymptomatic  General: Well-developed, well-nourished, no acute distress. Eyes: Pink conjunctiva, anicteric sclera. Lungs: Clear to auscultation bilaterally. Heart: Regular rate and rhythm. No rubs, murmurs, or gallops. Abdomen: Soft, nontender, nondistended. No organomegaly noted, normoactive bowel sounds. Musculoskeletal: No edema, cyanosis, or clubbing. Neuro: Alert, answering all questions appropriately. Cranial nerves grossly intact. Skin: No rashes or petechiae noted. Psych: Normal affect.   LAB RESULTS:  Lab Results  Component Value Date   NA 140 01/16/2016   K 3.5 01/16/2016   CL 100 (L) 01/16/2016   CO2 31 01/16/2016   GLUCOSE 172 (H) 01/16/2016   BUN 15 01/16/2016    CREATININE 0.65 01/16/2016   CALCIUM 9.0 01/16/2016   PROT 7.2 01/16/2016   ALBUMIN 3.8 01/16/2016   AST 21 01/16/2016   ALT 19 01/16/2016   ALKPHOS 54 01/16/2016   BILITOT 0.6 01/16/2016   GFRNONAA >60 01/16/2016   GFRAA >60 01/16/2016    Lab Results  Component Value Date   WBC 4.3 01/16/2016   NEUTROABS 2.6 01/16/2016   HGB 11.8 (L) 01/16/2016   HCT 36.5 01/16/2016   MCV 95.5 01/16/2016   PLT 197 01/16/2016   ONCOLOGIC HISTORY:   Patient was initially diagnosed with a 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 PET scan on August 25, 2012 revealed a hypermetabolic nodule 2.1 cm in the  left apices. Bronchoscopy on August 31, 2012 did not show any evidence of malignancy, but CT-guided biopsy on September 18, 2012 confirmed squamous cell carcinoma. Patient was determined not to be a surgical candidate at that time and underwent SBRT in May 2014. CT scan on April 23, 2013 revealed an enlarging prevascular lymph node. PET scan on June 04, 2013 revealed a new enlarging left lobe paratracheal lymph node with no other evidence of metastatic disease. Subsequent EBUS on June 13, 2013 confirmed recurrent squamous cell carcinoma. Patient underwent XRT along with concurrent navelbine between July 16, 2013 - August 28, 2013. Patient was then disease-free until CT scan on May 2,2017 revealed an interval increase of a nodule measuring up to 2.3 cm in the left lower lobe. PET scan on Oct 12, 7369 was hypermetabolic in the left upper, left lower and right middle lobe nodules concerning for neoplasm.  STUDIES: No results found.  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  PLAN:    1. 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  Proceed with cycle 3, day 1 of carboplatinum and gemcitabine. Patient will receive carboplatinum AUC 4 on day 1 and gemcitabine  750 mg/m on days 1 and 8. Immunotherapy was considered, but patient's PDL 1 is less than 1%. Patient refuses port placement. Return to clinic in 1 week for consideration of cycle 3, day 8. Plan to re-image after cycle 4. 2. Hypertension: Patient's blood pressure is significantly elevated today, continue to monitor. Continue current medications. Patient reports she did not take her blood pressure medications again as prescribed today. 3. Hypokalemia: Resolved. Patient was previously given dietary recommendations.  4.. Anemia: Secondary chemotherapy, monitor.  Patient expressed understanding and was in agreement with this plan. She also understands that She can call clinic at any time with any questions, concerns, or complaints.    Lloyd Huger, MD   01/16/2016 9:11 AM

## 2016-01-16 ENCOUNTER — Inpatient Hospital Stay (HOSPITAL_BASED_OUTPATIENT_CLINIC_OR_DEPARTMENT_OTHER): Payer: Commercial Managed Care - HMO | Admitting: Oncology

## 2016-01-16 ENCOUNTER — Inpatient Hospital Stay: Payer: Commercial Managed Care - HMO

## 2016-01-16 VITALS — BP 191/75 | HR 65 | Temp 96.6°F | Resp 18 | Wt 203.6 lb

## 2016-01-16 VITALS — BP 153/70 | HR 49 | Temp 97.3°F | Resp 18

## 2016-01-16 DIAGNOSIS — E119 Type 2 diabetes mellitus without complications: Secondary | ICD-10-CM

## 2016-01-16 DIAGNOSIS — Z5111 Encounter for antineoplastic chemotherapy: Secondary | ICD-10-CM | POA: Diagnosis not present

## 2016-01-16 DIAGNOSIS — Z79899 Other long term (current) drug therapy: Secondary | ICD-10-CM

## 2016-01-16 DIAGNOSIS — E039 Hypothyroidism, unspecified: Secondary | ICD-10-CM

## 2016-01-16 DIAGNOSIS — C3412 Malignant neoplasm of upper lobe, left bronchus or lung: Secondary | ICD-10-CM | POA: Diagnosis not present

## 2016-01-16 DIAGNOSIS — D6481 Anemia due to antineoplastic chemotherapy: Secondary | ICD-10-CM

## 2016-01-16 DIAGNOSIS — I4891 Unspecified atrial fibrillation: Secondary | ICD-10-CM | POA: Diagnosis not present

## 2016-01-16 DIAGNOSIS — I10 Essential (primary) hypertension: Secondary | ICD-10-CM | POA: Diagnosis not present

## 2016-01-16 DIAGNOSIS — Z801 Family history of malignant neoplasm of trachea, bronchus and lung: Secondary | ICD-10-CM

## 2016-01-16 DIAGNOSIS — C3492 Malignant neoplasm of unspecified part of left bronchus or lung: Secondary | ICD-10-CM

## 2016-01-16 DIAGNOSIS — C3482 Malignant neoplasm of overlapping sites of left bronchus and lung: Secondary | ICD-10-CM

## 2016-01-16 DIAGNOSIS — E785 Hyperlipidemia, unspecified: Secondary | ICD-10-CM

## 2016-01-16 DIAGNOSIS — Z7901 Long term (current) use of anticoagulants: Secondary | ICD-10-CM

## 2016-01-16 DIAGNOSIS — Z7984 Long term (current) use of oral hypoglycemic drugs: Secondary | ICD-10-CM

## 2016-01-16 DIAGNOSIS — Z87891 Personal history of nicotine dependence: Secondary | ICD-10-CM

## 2016-01-16 DIAGNOSIS — J449 Chronic obstructive pulmonary disease, unspecified: Secondary | ICD-10-CM

## 2016-01-16 LAB — CBC WITH DIFFERENTIAL/PLATELET
BASOS ABS: 0 10*3/uL (ref 0–0.1)
BASOS PCT: 0 %
EOS ABS: 0.1 10*3/uL (ref 0–0.7)
EOS PCT: 2 %
HCT: 36.5 % (ref 35.0–47.0)
Hemoglobin: 11.8 g/dL — ABNORMAL LOW (ref 12.0–16.0)
Lymphocytes Relative: 12 %
Lymphs Abs: 0.5 10*3/uL — ABNORMAL LOW (ref 1.0–3.6)
MCH: 30.8 pg (ref 26.0–34.0)
MCHC: 32.3 g/dL (ref 32.0–36.0)
MCV: 95.5 fL (ref 80.0–100.0)
MONO ABS: 1 10*3/uL — AB (ref 0.2–0.9)
Monocytes Relative: 24 %
Neutro Abs: 2.6 10*3/uL (ref 1.4–6.5)
Neutrophils Relative %: 62 %
PLATELETS: 197 10*3/uL (ref 150–440)
RBC: 3.82 MIL/uL (ref 3.80–5.20)
RDW: 21.1 % — AB (ref 11.5–14.5)
WBC: 4.3 10*3/uL (ref 3.6–11.0)

## 2016-01-16 LAB — COMPREHENSIVE METABOLIC PANEL
ALT: 19 U/L (ref 14–54)
AST: 21 U/L (ref 15–41)
Albumin: 3.8 g/dL (ref 3.5–5.0)
Alkaline Phosphatase: 54 U/L (ref 38–126)
Anion gap: 9 (ref 5–15)
BUN: 15 mg/dL (ref 6–20)
CHLORIDE: 100 mmol/L — AB (ref 101–111)
CO2: 31 mmol/L (ref 22–32)
CREATININE: 0.65 mg/dL (ref 0.44–1.00)
Calcium: 9 mg/dL (ref 8.9–10.3)
GFR calc Af Amer: >60 mL/min (ref >60)
Glucose, Bld: 172 mg/dL — ABNORMAL HIGH (ref 65–99)
POTASSIUM: 3.5 mmol/L (ref 3.5–5.1)
SODIUM: 140 mmol/L (ref 135–145)
Total Bilirubin: 0.6 mg/dL (ref 0.3–1.2)
Total Protein: 7.2 g/dL (ref 6.5–8.1)

## 2016-01-16 MED ORDER — SODIUM CHLORIDE 0.9 % IV SOLN
Freq: Once | INTRAVENOUS | Status: AC
  Administered 2016-01-16: 10:00:00 via INTRAVENOUS
  Filled 2016-01-16: qty 1000

## 2016-01-16 MED ORDER — CARBOPLATIN CHEMO INJECTION 450 MG/45ML
381.2000 mg | Freq: Once | INTRAVENOUS | Status: AC
  Administered 2016-01-16: 380 mg via INTRAVENOUS
  Filled 2016-01-16: qty 38

## 2016-01-16 MED ORDER — SODIUM CHLORIDE 0.9 % IV SOLN
750.0000 mg/m2 | Freq: Once | INTRAVENOUS | Status: AC
  Administered 2016-01-16: 1520 mg via INTRAVENOUS
  Filled 2016-01-16: qty 24.2

## 2016-01-16 MED ORDER — DEXAMETHASONE SODIUM PHOSPHATE 100 MG/10ML IJ SOLN
10.0000 mg | Freq: Once | INTRAMUSCULAR | Status: AC
  Administered 2016-01-16: 10 mg via INTRAVENOUS
  Filled 2016-01-16: qty 1

## 2016-01-16 MED ORDER — PALONOSETRON HCL INJECTION 0.25 MG/5ML
0.2500 mg | Freq: Once | INTRAVENOUS | Status: AC
  Administered 2016-01-16: 0.25 mg via INTRAVENOUS
  Filled 2016-01-16: qty 5

## 2016-01-16 NOTE — Progress Notes (Signed)
Only complaint is chronic knee pain.

## 2016-01-21 NOTE — Progress Notes (Signed)
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  INTERVAL HISTORY: Patient returns to clinic today for further evaluation and consideration of cycle 3, day 8 of dose reduced carboplatinum and gemcitabine. She continues to feel well and is asymptomatic. She has no neurologic complaints. She denies any recent fevers or illnesses. She has a good appetite and denies weight loss. She has no chest pain or shortness of breath. She denies any nausea, vomiting, constipation, or diarrhea. She has no urinary complaints. Patient offers no specific complaints today.   REVIEW OF SYSTEMS:   Review of Systems  Constitutional: Negative.  Negative for fever, malaise/fatigue and weight loss.  Respiratory: Negative for cough and shortness of breath.   Cardiovascular: Negative.  Negative for chest pain.  Gastrointestinal: Negative.  Negative for abdominal pain, nausea and vomiting.  Genitourinary: Negative.   Musculoskeletal: Negative.   Neurological: Negative.  Negative for weakness.  Psychiatric/Behavioral: Negative.  The patient is not nervous/anxious.     As per HPI. Otherwise, a complete review of systems is negatve.  PAST MEDICAL HISTORY: Past Medical History:  Diagnosis Date  . Atrial fibrillation ([place])   . COPD (chronic obstructive pulmonary disease) ([place])   . Diabetes mellitus without complication ([place])   . Hyperlipidemia   . Hypertension   . Hypothyroidism   . Lung cancer [facility]) 2015   Dr Merleen Nicely [facility], chemo and rad  . Thyroid disease     PAST SURGICAL HISTORY: Past Surgical History:  Procedure Laterality Date  . COLONOSCOPY  2006   Dr Raynelle Fanning patient ?polyps  . COLONOSCOPY WITH PROPOFOL N/A  01/30/2015   Procedure: COLONOSCOPY WITH PROPOFOL;  Surgeon: Manya Silvas, MD;  Location: [facility] ENDOSCOPY;  Service: Endoscopy;  Laterality: N/A;  . ESOPHAGOGASTRODUODENOSCOPY (EGD) WITH PROPOFOL N/A 01/30/2015   Procedure: ESOPHAGOGASTRODUODENOSCOPY (EGD) WITH PROPOFOL;  Surgeon: Manya Silvas, MD;  Location: [facility] ENDOSCOPY;  Service: Endoscopy;  Laterality: N/A;  . JOINT REPLACEMENT    . Left knee surgery Left   . LUNG BIOPSY      FAMILY HISTORY Family History  Problem Relation Age of Onset  . Heart attack Mother   . Lung cancer Father   . Heart attack Son 67       ADVANCED DIRECTIVES:    HEALTH MAINTENANCE: Social History  Substance Use Topics  . Smoking status: Former Smoker    Quit date: 10/29/2013  . Smokeless tobacco: Never Used  . Alcohol use No     Colonoscopy:  PAP:  Bone density:  Lipid panel:  Allergies  Allergen Reactions  . Aleve [Naproxen Sodium]     Pt states can tolerate some, but causes GI upset  . Celebrex [Celecoxib] Other (See Comments)    Pt states can handle some, but causes GI upset    Current Outpatient Prescriptions  Medication Sig Dispense Refill  . atorvastatin (LIPITOR) 10 MG tablet Take 10 mg by mouth daily at 6 PM.     . carvedilol (COREG) 6.25 MG tablet Take 6.25 mg by mouth 2 (two) times daily with a meal.     . Cholecalciferol (VITAMIN D3) 2000 units capsule Take 2,000 Units by mouth daily.     Marland Kitchen dexamethasone (DECADRON) 4 MG tablet Take 8 mg by mouth daily.  Take for 2 days after [place] chemotherapy, then as directed.    . empagliflozin (JARDIANCE) 10 MG TABS tablet Take 10 mg by mouth daily.    . furosemide (LASIX) 40 MG tablet Take 40 mg by mouth daily.     Marland Kitchen levothyroxine (SYNTHROID, LEVOTHROID) 50 MCG tablet Take 50 mcg by mouth 2 (two) times daily.     Marland Kitchen losartan (COZAAR) 50 MG tablet Take 50 mg by mouth daily.     . Magnesium 250 MG TABS Take 1 tablet by mouth daily.     . metFORMIN (GLUCOPHAGE-XR) 500 MG 24 hr tablet Take  1,000 mg by mouth daily with breakfast.     . pantoprazole (PROTONIX) 40 MG tablet Take 40 mg by mouth daily.     . prochlorperazine (COMPAZINE) 10 MG tablet Take 10 mg by mouth every 6 (six) hours as needed.     . warfarin (COUMADIN) 4 MG tablet Take 6 mg by mouth one time only at 6 PM.      No current facility-administered medications for this visit.    Facility-Administered Medications Ordered in Other Visits  Medication Dose Route Frequency Provider Last Rate Last Dose  . prochlorperazine (COMPAZINE) tablet 10 mg  10 mg Oral Once Lloyd Huger, MD        OBJECTIVE: There were no vitals filed for this visit.   There is no height or weight on file to calculate BMI.    ECOG FS:0 - Asymptomatic  General: Well-developed, well-nourished, no acute distress. Eyes: Pink conjunctiva, anicteric sclera. Lungs: Clear to auscultation bilaterally. Heart: Regular rate and rhythm. No rubs, murmurs, or gallops. Abdomen: Soft, nontender, nondistended. No organomegaly noted, normoactive bowel sounds. Musculoskeletal: No edema, cyanosis, or clubbing. Neuro: Alert, answering all questions appropriately. Cranial nerves grossly intact. Skin: No rashes or petechiae noted. Psych: Normal affect.   LAB RESULTS:  Lab Results  Component Value Date   NA 139 01/23/2016   K 4.3 01/23/2016   CL 101 01/23/2016   CO2 31 01/23/2016   GLUCOSE 174 (H) 01/23/2016   BUN 12 01/23/2016   CREATININE 0.63 01/23/2016   CALCIUM 9.0 01/23/2016   PROT 7.3 01/23/2016   ALBUMIN 3.6 01/23/2016   AST 26 01/23/2016   ALT 26 01/23/2016   ALKPHOS 51 01/23/2016   BILITOT 0.7 01/23/2016   GFRNONAA >60 01/23/2016   GFRAA >60 01/23/2016    Lab Results  Component Value Date   WBC 2.4 (L) 01/23/2016   NEUTROABS 1.4 01/23/2016   HGB 11.2 (L) 01/23/2016   HCT 34.1 (L) 01/23/2016   MCV 96.2 01/23/2016   PLT 187 01/23/2016   ONCOLOGIC HISTORY:   Patient was initially diagnosed with a stage I squamous cell carcinoma  of the left lung. PET scan on August 25, 2012 revealed a hypermetabolic nodule 2.1 cm in the left apices. Bronchoscopy on August 31, 2012 did not show any evidence of malignancy, but CT-guided biopsy on September 18, 2012 confirmed squamous cell carcinoma. Patient was determined not to be a surgical candidate at that time and underwent SBRT in May 2014. CT scan on April 23, 2013 revealed an enlarging prevascular lymph node. PET scan on June 04, 2013 revealed a new enlarging left lobe paratracheal lymph node with no other evidence of metastatic disease. Subsequent EBUS on June 13, 2013 confirmed recurrent squamous cell carcinoma. Patient underwent XRT along with concurrent navelbine between July 16, 2013 - August 28, 2013. Patient was then disease-free until CT scan on May 2,2017 revealed  an interval increase of a nodule measuring up to 2.3 cm in the left lower lobe. PET scan on Oct 13, 6823 was hypermetabolic in the left upper, left lower and right middle lobe nodules concerning for neoplasm.  STUDIES: No results found.  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  PLAN:    1. 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  Proceed with cycle 3, day 8 of carboplatinum and gemcitabine. Gemcitabine only today. Patient will receive carboplatinum AUC 4 on day 1 and gemcitabine 750 mg/m on days 1 and 8. Immunotherapy was considered, but patient's PDL 1 is less than 1%. Patient refuses port placement. Return to clinic in 2 weeks for consideration of cycle 4, day 1. Plan to re-image after cycle 4. 2. Hypertension: Patient's blood pressure is significantly elevated today, continue to monitor. Continue current medications. Patient reports she did not take her blood pressure medications again as prescribed today. 3. Hypokalemia: Resolved. Patient was previously given  dietary recommendations.  4. Anemia: Secondary chemotherapy, monitor. 5. Leukopenia: Also secondary to chemotherapy. Proceed with treatment as above.  Patient expressed understanding and was in agreement with this plan. She also understands that She can call clinic at any time with any questions, concerns, or complaints.    Lloyd Huger, MD   01/23/2016 1:17 PM

## 2016-01-23 ENCOUNTER — Inpatient Hospital Stay: Payer: Commercial Managed Care - HMO

## 2016-01-23 ENCOUNTER — Inpatient Hospital Stay (HOSPITAL_BASED_OUTPATIENT_CLINIC_OR_DEPARTMENT_OTHER): Payer: Commercial Managed Care - HMO | Admitting: Oncology

## 2016-01-23 VITALS — BP 150/78 | HR 49 | Temp 96.7°F | Resp 18

## 2016-01-23 DIAGNOSIS — C7802 Secondary malignant neoplasm of left lung: Secondary | ICD-10-CM | POA: Diagnosis not present

## 2016-01-23 DIAGNOSIS — C3412 Malignant neoplasm of upper lobe, left bronchus or lung: Secondary | ICD-10-CM | POA: Diagnosis not present

## 2016-01-23 DIAGNOSIS — E039 Hypothyroidism, unspecified: Secondary | ICD-10-CM

## 2016-01-23 DIAGNOSIS — Z801 Family history of malignant neoplasm of trachea, bronchus and lung: Secondary | ICD-10-CM

## 2016-01-23 DIAGNOSIS — T451X5S Adverse effect of antineoplastic and immunosuppressive drugs, sequela: Secondary | ICD-10-CM

## 2016-01-23 DIAGNOSIS — C3492 Malignant neoplasm of unspecified part of left bronchus or lung: Secondary | ICD-10-CM

## 2016-01-23 DIAGNOSIS — Z79899 Other long term (current) drug therapy: Secondary | ICD-10-CM

## 2016-01-23 DIAGNOSIS — C7801 Secondary malignant neoplasm of right lung: Secondary | ICD-10-CM | POA: Diagnosis not present

## 2016-01-23 DIAGNOSIS — I10 Essential (primary) hypertension: Secondary | ICD-10-CM | POA: Diagnosis not present

## 2016-01-23 DIAGNOSIS — I4891 Unspecified atrial fibrillation: Secondary | ICD-10-CM

## 2016-01-23 DIAGNOSIS — C3482 Malignant neoplasm of overlapping sites of left bronchus and lung: Secondary | ICD-10-CM

## 2016-01-23 DIAGNOSIS — J449 Chronic obstructive pulmonary disease, unspecified: Secondary | ICD-10-CM

## 2016-01-23 DIAGNOSIS — E876 Hypokalemia: Secondary | ICD-10-CM

## 2016-01-23 DIAGNOSIS — Z5111 Encounter for antineoplastic chemotherapy: Secondary | ICD-10-CM | POA: Diagnosis not present

## 2016-01-23 DIAGNOSIS — Z7901 Long term (current) use of anticoagulants: Secondary | ICD-10-CM

## 2016-01-23 DIAGNOSIS — Z87891 Personal history of nicotine dependence: Secondary | ICD-10-CM

## 2016-01-23 DIAGNOSIS — Z7984 Long term (current) use of oral hypoglycemic drugs: Secondary | ICD-10-CM

## 2016-01-23 DIAGNOSIS — E119 Type 2 diabetes mellitus without complications: Secondary | ICD-10-CM

## 2016-01-23 DIAGNOSIS — D701 Agranulocytosis secondary to cancer chemotherapy: Secondary | ICD-10-CM

## 2016-01-23 DIAGNOSIS — D6481 Anemia due to antineoplastic chemotherapy: Secondary | ICD-10-CM

## 2016-01-23 DIAGNOSIS — E785 Hyperlipidemia, unspecified: Secondary | ICD-10-CM

## 2016-01-23 LAB — CBC WITH DIFFERENTIAL/PLATELET
BAND NEUTROPHILS: 0 %
BASOS ABS: 0.1 10*3/uL (ref 0–0.1)
BLASTS: 0 %
Basophils Relative: 3 %
EOS ABS: 0 10*3/uL (ref 0–0.7)
Eosinophils Relative: 2 %
HCT: 34.1 % — ABNORMAL LOW (ref 35.0–47.0)
HEMOGLOBIN: 11.2 g/dL — AB (ref 12.0–16.0)
Lymphocytes Relative: 24 %
Lymphs Abs: 0.6 10*3/uL — ABNORMAL LOW (ref 1.0–3.6)
MCH: 31.7 pg (ref 26.0–34.0)
MCHC: 32.9 g/dL (ref 32.0–36.0)
MCV: 96.2 fL (ref 80.0–100.0)
METAMYELOCYTES PCT: 0 %
Monocytes Absolute: 0.3 10*3/uL (ref 0.2–0.9)
Monocytes Relative: 14 %
Myelocytes: 0 %
NEUTROS ABS: 1.4 10*3/uL (ref 1.4–6.5)
Neutrophils Relative %: 57 %
Other: 0 %
PLATELETS: 187 10*3/uL (ref 150–440)
PROMYELOCYTES ABS: 0 %
RBC: 3.55 MIL/uL — ABNORMAL LOW (ref 3.80–5.20)
RDW: 21.5 % — ABNORMAL HIGH (ref 11.5–14.5)
WBC: 2.4 10*3/uL — ABNORMAL LOW (ref 3.6–11.0)
nRBC: 3 /100 WBC — ABNORMAL HIGH

## 2016-01-23 LAB — COMPREHENSIVE METABOLIC PANEL
ALBUMIN: 3.6 g/dL (ref 3.5–5.0)
ALK PHOS: 51 U/L (ref 38–126)
ALT: 26 U/L (ref 14–54)
ANION GAP: 7 (ref 5–15)
AST: 26 U/L (ref 15–41)
BUN: 12 mg/dL (ref 6–20)
CALCIUM: 9 mg/dL (ref 8.9–10.3)
CHLORIDE: 101 mmol/L (ref 101–111)
CO2: 31 mmol/L (ref 22–32)
Creatinine, Ser: 0.63 mg/dL (ref 0.44–1.00)
GFR calc Af Amer: >60 mL/min (ref >60)
GFR calc non Af Amer: >60 mL/min (ref >60)
GLUCOSE: 174 mg/dL — AB (ref 65–99)
Potassium: 4.3 mmol/L (ref 3.5–5.1)
SODIUM: 139 mmol/L (ref 135–145)
Total Bilirubin: 0.7 mg/dL (ref 0.3–1.2)
Total Protein: 7.3 g/dL (ref 6.5–8.1)

## 2016-01-23 MED ORDER — PROCHLORPERAZINE MALEATE 10 MG PO TABS
10.0000 mg | ORAL_TABLET | Freq: Once | ORAL | Status: DC
  Filled 2016-01-23: qty 1

## 2016-01-23 MED ORDER — SODIUM CHLORIDE 0.9 % IV SOLN
750.0000 mg/m2 | Freq: Once | INTRAVENOUS | Status: AC
  Administered 2016-01-23: 1520 mg via INTRAVENOUS
  Filled 2016-01-23: qty 40

## 2016-01-23 MED ORDER — SODIUM CHLORIDE 0.9 % IV SOLN
Freq: Once | INTRAVENOUS | Status: AC
  Administered 2016-01-23: 11:00:00 via INTRAVENOUS
  Filled 2016-01-23: qty 1000

## 2016-01-23 NOTE — Progress Notes (Signed)
Patient here today for ongoing follow up and treatment consideration regarding lung cancer. Patient seen by Dr. Grayland Ormond in infusion area.

## 2016-02-05 NOTE — Progress Notes (Signed)
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  INTERVAL HISTORY: Patient returns to clinic today for further evaluation and consideration of cycle 4, day 1 of dose reduced carboplatinum and gemcitabine. She continues to feel well and is asymptomatic. She has no neurologic complaints. She denies any recent fevers or illnesses. She has a good appetite and denies weight loss. She has no chest pain or shortness of breath. She denies any nausea, vomiting, constipation, or diarrhea. She has no urinary complaints. Patient offers no specific complaints today.   REVIEW OF SYSTEMS:   Review of Systems  Constitutional: Negative.  Negative for fever, malaise/fatigue and weight loss.  Respiratory: Negative for cough and shortness of breath.   Cardiovascular: Negative.  Negative for chest pain.  Gastrointestinal: Negative.  Negative for abdominal pain, nausea and vomiting.  Genitourinary: Negative.   Musculoskeletal: Negative.   Neurological: Negative.  Negative for weakness.  Psychiatric/Behavioral: Negative.  The patient is not nervous/anxious.     As per HPI. Otherwise, a complete review of systems is negative.  PAST MEDICAL HISTORY: Past Medical History:  Diagnosis Date  . Atrial fibrillation ([place])   . COPD (chronic obstructive pulmonary disease) ([place])   . Diabetes mellitus without complication ([place])   . Hyperlipidemia   . Hypertension   . Hypothyroidism   . Lung cancer [facility]) 2015   Dr Merleen Nicely [facility], chemo and rad  . Thyroid disease     PAST SURGICAL HISTORY: Past Surgical History:  Procedure Laterality Date  . COLONOSCOPY  2006   Dr Raynelle Fanning patient ?polyps  . COLONOSCOPY WITH PROPOFOL N/A  01/30/2015   Procedure: COLONOSCOPY WITH PROPOFOL;  Surgeon: Manya Silvas, MD;  Location: [facility] ENDOSCOPY;  Service: Endoscopy;  Laterality: N/A;  . ESOPHAGOGASTRODUODENOSCOPY (EGD) WITH PROPOFOL N/A 01/30/2015   Procedure: ESOPHAGOGASTRODUODENOSCOPY (EGD) WITH PROPOFOL;  Surgeon: Manya Silvas, MD;  Location: [facility] ENDOSCOPY;  Service: Endoscopy;  Laterality: N/A;  . JOINT REPLACEMENT    . Left knee surgery Left   . LUNG BIOPSY      FAMILY HISTORY Family History  Problem Relation Age of Onset  . Heart attack Mother   . Lung cancer Father   . Heart attack Son 62       ADVANCED DIRECTIVES:    HEALTH MAINTENANCE: Social History  Substance Use Topics  . Smoking status: Former Smoker    Quit date: 10/29/2013  . Smokeless tobacco: Never Used  . Alcohol use No     Colonoscopy:  PAP:  Bone density:  Lipid panel:  Allergies  Allergen Reactions  . Aleve [Naproxen Sodium]     Pt states can tolerate some, but causes GI upset  . Celebrex [Celecoxib] Other (See Comments)    Pt states can handle some, but causes GI upset    Current Outpatient Prescriptions  Medication Sig Dispense Refill  . atorvastatin (LIPITOR) 10 MG tablet Take 10 mg by mouth daily at 6 PM.     . carvedilol (COREG) 6.25 MG tablet Take 6.25 mg by mouth 2 (two) times daily with a meal.     . Cholecalciferol (VITAMIN D3) 2000 units capsule Take 2,000 Units by mouth daily.     Marland Kitchen dexamethasone (DECADRON) 4 MG tablet Take 8 mg by mouth daily.  Take for 2 days after [place] chemotherapy, then as directed.    . empagliflozin (JARDIANCE) 10 MG TABS tablet Take 10 mg by mouth daily.    . furosemide (LASIX) 40 MG tablet Take 40 mg by mouth daily.     Marland Kitchen levothyroxine (SYNTHROID, LEVOTHROID) 50 MCG tablet Take 50 mcg by mouth 2 (two) times daily.     Marland Kitchen losartan (COZAAR) 50 MG tablet Take 50 mg by mouth daily.     . Magnesium 250 MG TABS Take 1 tablet by mouth daily.     . metFORMIN (GLUCOPHAGE-XR) 500 MG 24 hr tablet Take  1,000 mg by mouth daily with breakfast.     . pantoprazole (PROTONIX) 40 MG tablet Take 40 mg by mouth daily.     . prochlorperazine (COMPAZINE) 10 MG tablet Take 10 mg by mouth every 6 (six) hours as needed.     . warfarin (COUMADIN) 4 MG tablet Take 6 mg by mouth one time only at 6 PM.      No current facility-administered medications for this visit.     OBJECTIVE: Vitals:   02/06/16 0855  BP: (!) 177/89  Pulse: 61  Temp: 97 F (36.1 C)     There is no height or weight on file to calculate BMI.    ECOG FS:0 - Asymptomatic  General: Well-developed, well-nourished, no acute distress. Eyes: Pink conjunctiva, anicteric sclera. Lungs: Clear to auscultation bilaterally. Heart: Regular rate and rhythm. No rubs, murmurs, or gallops. Abdomen: Soft, nontender, nondistended. No organomegaly noted, normoactive bowel sounds. Musculoskeletal: No edema, cyanosis, or clubbing. Neuro: Alert, answering all questions appropriately. Cranial nerves grossly intact. Skin: No rashes or petechiae noted. Psych: Normal affect.   LAB RESULTS:  Lab Results  Component Value Date   NA 137 02/06/2016   K 3.8 02/06/2016   CL 100 (L) 02/06/2016   CO2 29 02/06/2016   GLUCOSE 172 (H) 02/06/2016   BUN 13 02/06/2016   CREATININE 0.63 02/06/2016   CALCIUM 8.7 (L) 02/06/2016   PROT 7.5 02/06/2016   ALBUMIN 3.5 02/06/2016   AST 22 02/06/2016   ALT 17 02/06/2016   ALKPHOS 56 02/06/2016   BILITOT 0.5 02/06/2016   GFRNONAA >60 02/06/2016   GFRAA >60 02/06/2016    Lab Results  Component Value Date   WBC 4.1 02/06/2016   NEUTROABS 2.5 02/06/2016   HGB 11.1 (L) 02/06/2016   HCT 33.9 (L) 02/06/2016   MCV 99.3 02/06/2016   PLT 183 02/06/2016   ONCOLOGIC HISTORY:   Patient was initially diagnosed with a 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 PET scan on August 25, 2012 revealed a hypermetabolic nodule 2.1 cm in the left apices. Bronchoscopy on August 31, 2012 did not show any evidence of  malignancy, but CT-guided biopsy on September 18, 2012 confirmed squamous cell carcinoma. Patient was determined not to be a surgical candidate at that time and underwent SBRT in May 2014. CT scan on April 23, 2013 revealed an enlarging prevascular lymph node. PET scan on June 04, 2013 revealed a new enlarging left lobe paratracheal lymph node with no other evidence of metastatic disease. Subsequent EBUS on June 13, 2013 confirmed recurrent squamous cell carcinoma. Patient underwent XRT along with concurrent navelbine between July 16, 2013 - August 28, 2013. Patient was then disease-free until CT scan on Sep 30, 2015 revealed an interval increase of a nodule measuring up to 2.3 cm in the left lower lobe. PET scan on Oct 13, 5359 was hypermetabolic  in the left upper, left lower and right middle lobe nodules concerning for neoplasm.  STUDIES: No results found.  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  PLAN:    1. 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  Proceed with cycle 4, day 1 of carboplatinum and gemcitabine. Patient will receive carboplatinum AUC 4 on day 1 and gemcitabine 750 mg/m on days 1 and 8. Immunotherapy was considered, but patient's PDL 1 is less than 1%. Patient refuses port placement. Return to clinic in 1 week for consideration of cycle 4, day 8 which will occur in [place]. Patient will then return to clinic in 5 weeks for CT scan follow-up 1-2 days later to discuss the results. 2. Hypertension: Patient's blood pressure is significantly elevated today, continue to monitor. Continue current medications. Patient reports she did not take her blood pressure medications again as prescribed today. 3. Hypokalemia: Resolved. Patient was previously given dietary recommendations.  4. Anemia: Secondary chemotherapy, monitor. 5. Leukopenia:  Also secondary to chemotherapy. Proceed with treatment as above.  Patient expressed understanding and was in agreement with this plan. She also understands that She can call clinic at any time with any questions, concerns, or complaints.    Lloyd Huger, MD   02/07/2016 8:42 AM

## 2016-02-06 ENCOUNTER — Inpatient Hospital Stay (HOSPITAL_BASED_OUTPATIENT_CLINIC_OR_DEPARTMENT_OTHER): Payer: Commercial Managed Care - HMO | Admitting: Oncology

## 2016-02-06 ENCOUNTER — Inpatient Hospital Stay: Payer: Commercial Managed Care - HMO | Attending: Oncology

## 2016-02-06 ENCOUNTER — Inpatient Hospital Stay: Payer: Commercial Managed Care - HMO

## 2016-02-06 VITALS — BP 177/89 | HR 61 | Temp 97.0°F

## 2016-02-06 VITALS — BP 140/83 | HR 54 | Temp 97.6°F | Resp 18

## 2016-02-06 DIAGNOSIS — D701 Agranulocytosis secondary to cancer chemotherapy: Secondary | ICD-10-CM

## 2016-02-06 DIAGNOSIS — C3482 Malignant neoplasm of overlapping sites of left bronchus and lung: Secondary | ICD-10-CM

## 2016-02-06 DIAGNOSIS — Z79899 Other long term (current) drug therapy: Secondary | ICD-10-CM | POA: Insufficient documentation

## 2016-02-06 DIAGNOSIS — Z87891 Personal history of nicotine dependence: Secondary | ICD-10-CM

## 2016-02-06 DIAGNOSIS — I10 Essential (primary) hypertension: Secondary | ICD-10-CM | POA: Insufficient documentation

## 2016-02-06 DIAGNOSIS — D6481 Anemia due to antineoplastic chemotherapy: Secondary | ICD-10-CM | POA: Insufficient documentation

## 2016-02-06 DIAGNOSIS — E119 Type 2 diabetes mellitus without complications: Secondary | ICD-10-CM | POA: Diagnosis not present

## 2016-02-06 DIAGNOSIS — Z7984 Long term (current) use of oral hypoglycemic drugs: Secondary | ICD-10-CM | POA: Insufficient documentation

## 2016-02-06 DIAGNOSIS — T451X5S Adverse effect of antineoplastic and immunosuppressive drugs, sequela: Secondary | ICD-10-CM | POA: Insufficient documentation

## 2016-02-06 DIAGNOSIS — E785 Hyperlipidemia, unspecified: Secondary | ICD-10-CM | POA: Diagnosis not present

## 2016-02-06 DIAGNOSIS — Z801 Family history of malignant neoplasm of trachea, bronchus and lung: Secondary | ICD-10-CM

## 2016-02-06 DIAGNOSIS — I4891 Unspecified atrial fibrillation: Secondary | ICD-10-CM

## 2016-02-06 DIAGNOSIS — Z7901 Long term (current) use of anticoagulants: Secondary | ICD-10-CM | POA: Insufficient documentation

## 2016-02-06 DIAGNOSIS — J449 Chronic obstructive pulmonary disease, unspecified: Secondary | ICD-10-CM | POA: Insufficient documentation

## 2016-02-06 DIAGNOSIS — C7800 Secondary malignant neoplasm of unspecified lung: Secondary | ICD-10-CM | POA: Diagnosis not present

## 2016-02-06 DIAGNOSIS — C3492 Malignant neoplasm of unspecified part of left bronchus or lung: Secondary | ICD-10-CM | POA: Diagnosis not present

## 2016-02-06 DIAGNOSIS — Z5111 Encounter for antineoplastic chemotherapy: Secondary | ICD-10-CM | POA: Diagnosis not present

## 2016-02-06 LAB — COMPREHENSIVE METABOLIC PANEL
ALT: 17 U/L (ref 14–54)
AST: 22 U/L (ref 15–41)
Albumin: 3.5 g/dL (ref 3.5–5.0)
Alkaline Phosphatase: 56 U/L (ref 38–126)
Anion gap: 8 (ref 5–15)
BUN: 13 mg/dL (ref 6–20)
CHLORIDE: 100 mmol/L — AB (ref 101–111)
CO2: 29 mmol/L (ref 22–32)
CREATININE: 0.63 mg/dL (ref 0.44–1.00)
Calcium: 8.7 mg/dL — ABNORMAL LOW (ref 8.9–10.3)
GFR calc non Af Amer: >60 mL/min (ref >60)
Glucose, Bld: 172 mg/dL — ABNORMAL HIGH (ref 65–99)
POTASSIUM: 3.8 mmol/L (ref 3.5–5.1)
SODIUM: 137 mmol/L (ref 135–145)
Total Bilirubin: 0.5 mg/dL (ref 0.3–1.2)
Total Protein: 7.5 g/dL (ref 6.5–8.1)

## 2016-02-06 LAB — CBC WITH DIFFERENTIAL/PLATELET
BASOS ABS: 0 10*3/uL (ref 0–0.1)
Basophils Relative: 1 %
EOS ABS: 0.1 10*3/uL (ref 0–0.7)
EOS PCT: 2 %
HCT: 33.9 % — ABNORMAL LOW (ref 35.0–47.0)
Hemoglobin: 11.1 g/dL — ABNORMAL LOW (ref 12.0–16.0)
Lymphocytes Relative: 12 %
Lymphs Abs: 0.5 10*3/uL — ABNORMAL LOW (ref 1.0–3.6)
MCH: 32.5 pg (ref 26.0–34.0)
MCHC: 32.7 g/dL (ref 32.0–36.0)
MCV: 99.3 fL (ref 80.0–100.0)
Monocytes Absolute: 1.1 10*3/uL — ABNORMAL HIGH (ref 0.2–0.9)
Monocytes Relative: 26 %
Neutro Abs: 2.5 10*3/uL (ref 1.4–6.5)
Neutrophils Relative %: 61 %
PLATELETS: 183 10*3/uL (ref 150–440)
RBC: 3.41 MIL/uL — AB (ref 3.80–5.20)
RDW: 25.9 % — AB (ref 11.5–14.5)
WBC: 4.1 10*3/uL (ref 3.6–11.0)

## 2016-02-06 MED ORDER — SODIUM CHLORIDE 0.9 % IV SOLN
Freq: Once | INTRAVENOUS | Status: AC
  Administered 2016-02-06: 10:00:00 via INTRAVENOUS
  Filled 2016-02-06: qty 1000

## 2016-02-06 MED ORDER — SODIUM CHLORIDE 0.9 % IV SOLN
10.0000 mg | Freq: Once | INTRAVENOUS | Status: AC
  Administered 2016-02-06: 10 mg via INTRAVENOUS
  Filled 2016-02-06: qty 1

## 2016-02-06 MED ORDER — CARBOPLATIN CHEMO INJECTION 450 MG/45ML
376.8000 mg | Freq: Once | INTRAVENOUS | Status: AC
  Administered 2016-02-06: 380 mg via INTRAVENOUS
  Filled 2016-02-06: qty 38

## 2016-02-06 MED ORDER — SODIUM CHLORIDE 0.9 % IV SOLN
750.0000 mg/m2 | Freq: Once | INTRAVENOUS | Status: AC
  Administered 2016-02-06: 1520 mg via INTRAVENOUS
  Filled 2016-02-06: qty 40

## 2016-02-06 MED ORDER — PALONOSETRON HCL INJECTION 0.25 MG/5ML
0.2500 mg | Freq: Once | INTRAVENOUS | Status: AC
  Administered 2016-02-06: 0.25 mg via INTRAVENOUS
  Filled 2016-02-06: qty 5

## 2016-02-06 NOTE — Progress Notes (Signed)
Patient here for pre treatment. States "everything is the same".

## 2016-02-13 ENCOUNTER — Inpatient Hospital Stay: Payer: Commercial Managed Care - HMO

## 2016-02-13 VITALS — BP 145/77 | HR 64 | Temp 97.6°F | Resp 20

## 2016-02-13 DIAGNOSIS — Z5111 Encounter for antineoplastic chemotherapy: Secondary | ICD-10-CM | POA: Diagnosis not present

## 2016-02-13 DIAGNOSIS — C3482 Malignant neoplasm of overlapping sites of left bronchus and lung: Secondary | ICD-10-CM

## 2016-02-13 LAB — COMPREHENSIVE METABOLIC PANEL
ALBUMIN: 3.6 g/dL (ref 3.5–5.0)
ALT: 25 U/L (ref 14–54)
AST: 31 U/L (ref 15–41)
Alkaline Phosphatase: 50 U/L (ref 38–126)
Anion gap: 8 (ref 5–15)
BUN: 15 mg/dL (ref 6–20)
CHLORIDE: 101 mmol/L (ref 101–111)
CO2: 28 mmol/L (ref 22–32)
Calcium: 8.9 mg/dL (ref 8.9–10.3)
Creatinine, Ser: 0.59 mg/dL (ref 0.44–1.00)
GFR calc Af Amer: >60 mL/min (ref >60)
GLUCOSE: 147 mg/dL — AB (ref 65–99)
POTASSIUM: 3.9 mmol/L (ref 3.5–5.1)
SODIUM: 137 mmol/L (ref 135–145)
Total Bilirubin: 0.6 mg/dL (ref 0.3–1.2)
Total Protein: 7.1 g/dL (ref 6.5–8.1)

## 2016-02-13 LAB — CBC WITH DIFFERENTIAL/PLATELET
Basophils Absolute: 0 10*3/uL (ref 0–0.1)
Basophils Relative: 2 %
EOS ABS: 0 10*3/uL (ref 0–0.7)
Eosinophils Relative: 1 %
HCT: 30.1 % — ABNORMAL LOW (ref 35.0–47.0)
Hemoglobin: 10.2 g/dL — ABNORMAL LOW (ref 12.0–16.0)
LYMPHS ABS: 0.5 10*3/uL — AB (ref 1.0–3.6)
MCH: 33.5 pg (ref 26.0–34.0)
MCHC: 33.9 g/dL (ref 32.0–36.0)
MCV: 98.6 fL (ref 80.0–100.0)
Monocytes Absolute: 0.4 10*3/uL (ref 0.2–0.9)
Neutro Abs: 1.1 10*3/uL — ABNORMAL LOW (ref 1.4–6.5)
PLATELETS: 175 10*3/uL (ref 150–440)
RBC: 3.06 MIL/uL — AB (ref 3.80–5.20)
RDW: 24.6 % — ABNORMAL HIGH (ref 11.5–14.5)
WBC: 2.1 10*3/uL — ABNORMAL LOW (ref 3.6–11.0)

## 2016-02-13 MED ORDER — SODIUM CHLORIDE 0.9 % IV SOLN
750.0000 mg/m2 | Freq: Once | INTRAVENOUS | Status: AC
Start: 2016-02-13 — End: 2016-02-13
  Administered 2016-02-13: 1520 mg via INTRAVENOUS
  Filled 2016-02-13: qty 26.3

## 2016-02-13 MED ORDER — PROCHLORPERAZINE MALEATE 10 MG PO TABS
10.0000 mg | ORAL_TABLET | Freq: Once | ORAL | Status: DC
  Filled 2016-02-13: qty 1

## 2016-02-13 MED ORDER — SODIUM CHLORIDE 0.9 % IV SOLN
Freq: Once | INTRAVENOUS | Status: AC
  Administered 2016-02-13: 10:00:00 via INTRAVENOUS
  Filled 2016-02-13: qty 1000

## 2016-03-12 ENCOUNTER — Ambulatory Visit
Admission: RE | Admit: 2016-03-12 | Discharge: 2016-03-12 | Disposition: A | Discharge status: Home / Self Care | Payer: Commercial Managed Care - HMO | Source: Ambulatory Visit | Attending: Oncology | Admitting: Oncology

## 2016-03-12 DIAGNOSIS — I899 Noninfective disorder of lymphatic vessels and lymph nodes, unspecified: Secondary | ICD-10-CM | POA: Diagnosis not present

## 2016-03-12 DIAGNOSIS — I7101 Dissection of thoracic aorta: Secondary | ICD-10-CM | POA: Insufficient documentation

## 2016-03-12 DIAGNOSIS — C3492 Malignant neoplasm of unspecified part of left bronchus or lung: Secondary | ICD-10-CM | POA: Insufficient documentation

## 2016-03-12 HISTORY — DX: Heart failure, unspecified: I50.9

## 2016-03-12 MED ORDER — IOPAMIDOL (ISOVUE-300) INJECTION 61%
75.0000 mL | Freq: Once | INTRAVENOUS | Status: AC | PRN
  Administered 2016-03-12: 75 mL via INTRAVENOUS

## 2016-03-17 NOTE — Progress Notes (Signed)
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  INTERVAL HISTORY: Patient returns to clinic today for further evaluation and discussion of her imaging results. She continues to feel well and is asymptomatic. She has no neurologic complaints. She denies any recent fevers or illnesses. She has a good appetite and denies weight loss. She has no chest pain or shortness of breath. She denies any nausea, vomiting, constipation, or diarrhea. She has no urinary complaints. Patient offers no specific complaints today.   REVIEW OF SYSTEMS:   Review of Systems  Constitutional: Negative.  Negative for fever, malaise/fatigue and weight loss.  Respiratory: Negative for cough and shortness of breath.   Cardiovascular: Negative.  Negative for chest pain.  Gastrointestinal: Negative.  Negative for abdominal pain, nausea and vomiting.  Genitourinary: Negative.   Musculoskeletal: Negative.   Neurological: Negative.  Negative for weakness.  Psychiatric/Behavioral: Negative.  The patient is not nervous/anxious.    As per HPI. Otherwise, a complete review of systems is negative.   PAST MEDICAL HISTORY: Past Medical History:  Diagnosis Date  . Atrial fibrillation ([place])   . CHF (congestive heart failure) ([place])   . COPD (chronic obstructive pulmonary disease) ([place])   . Diabetes mellitus without complication ([place])   . Hyperlipidemia   . Hypertension   . Hypothyroidism   . Lung cancer [facility]) 2015   Dr Merleen Nicely [facility], chemo and rad  . Thyroid disease     PAST SURGICAL HISTORY: Past Surgical History:  Procedure Laterality Date  . COLONOSCOPY  2006   Dr Raynelle Fanning patient ?polyps  . COLONOSCOPY WITH PROPOFOL N/A  01/30/2015   Procedure: COLONOSCOPY WITH PROPOFOL;  Surgeon: Manya Silvas, MD;  Location: [facility] ENDOSCOPY;  Service: Endoscopy;  Laterality: N/A;  . ESOPHAGOGASTRODUODENOSCOPY (EGD) WITH PROPOFOL N/A 01/30/2015   Procedure: ESOPHAGOGASTRODUODENOSCOPY (EGD) WITH PROPOFOL;  Surgeon: Manya Silvas, MD;  Location: [facility] ENDOSCOPY;  Service: Endoscopy;  Laterality: N/A;  . JOINT REPLACEMENT    . Left knee surgery Left   . LUNG BIOPSY      FAMILY HISTORY Family History  Problem Relation Age of Onset  . Heart attack Mother   . Lung cancer Father   . Heart attack Son 44       ADVANCED DIRECTIVES:    HEALTH MAINTENANCE: Social History  Substance Use Topics  . Smoking status: Former Smoker    Quit date: 10/29/2013  . Smokeless tobacco: Never Used  . Alcohol use No     Colonoscopy:  PAP:  Bone density:  Lipid panel:  Allergies  Allergen Reactions  . Aleve [Naproxen Sodium]     Pt states can tolerate some, but causes GI upset  . Celebrex [Celecoxib] Other (See Comments)    Pt states can handle some, but causes GI upset    Current Outpatient Prescriptions  Medication Sig Dispense Refill  . atorvastatin (LIPITOR) 10 MG tablet Take 10 mg by mouth daily at 6 PM.     . carvedilol (COREG) 6.25 MG tablet Take 6.25 mg by mouth 2 (two) times daily with a meal.     . Cholecalciferol (VITAMIN D3) 2000 units capsule Take 2,000 Units by mouth daily.     Marland Kitchen dexamethasone (DECADRON) 4 MG tablet Take 8 mg by mouth  daily. Take for 2 days after [place] chemotherapy, then as directed.    . empagliflozin (JARDIANCE) 10 MG TABS tablet Take 10 mg by mouth daily.    . furosemide (LASIX) 40 MG tablet Take 40 mg by mouth daily.     Marland Kitchen levothyroxine (SYNTHROID, LEVOTHROID) 50 MCG tablet Take 50 mcg by mouth 2 (two) times daily.     Marland Kitchen losartan (COZAAR) 50 MG tablet Take 50 mg by mouth daily.     . Magnesium 250 MG TABS Take 1 tablet by mouth daily.     . metFORMIN (GLUCOPHAGE-XR) 500 MG 24 hr tablet Take  1,000 mg by mouth daily with breakfast.     . pantoprazole (PROTONIX) 40 MG tablet Take 40 mg by mouth daily.     . prochlorperazine (COMPAZINE) 10 MG tablet Take 10 mg by mouth every 6 (six) hours as needed.     . warfarin (COUMADIN) 4 MG tablet Take 6 mg by mouth one time only at 6 PM.      No current facility-administered medications for this visit.     OBJECTIVE: Vitals:   03/19/16 1045  BP: (!) 157/85  Pulse: (!) 49  Resp: 18  Temp: 97.4 F (36.3 C)     Body mass index is 35.03 kg/m.    ECOG FS:0 - Asymptomatic  General: Well-developed, well-nourished, no acute distress. Eyes: Pink conjunctiva, anicteric sclera. Lungs: Clear to auscultation bilaterally. Heart: Regular rate and rhythm. No rubs, murmurs, or gallops. Abdomen: Soft, nontender, nondistended. No organomegaly noted, normoactive bowel sounds. Musculoskeletal: No edema, cyanosis, or clubbing. Neuro: Alert, answering all questions appropriately. Cranial nerves grossly intact. Skin: No rashes or petechiae noted. Psych: Normal affect.   LAB RESULTS:  Lab Results  Component Value Date   NA 137 02/13/2016   K 3.9 02/13/2016   CL 101 02/13/2016   CO2 28 02/13/2016   GLUCOSE 147 (H) 02/13/2016   BUN 15 02/13/2016   CREATININE 0.59 02/13/2016   CALCIUM 8.9 02/13/2016   PROT 7.1 02/13/2016   ALBUMIN 3.6 02/13/2016   AST 31 02/13/2016   ALT 25 02/13/2016   ALKPHOS 50 02/13/2016   BILITOT 0.6 02/13/2016   GFRNONAA >60 02/13/2016   GFRAA >60 02/13/2016    Lab Results  Component Value Date   WBC 2.1 (L) 02/13/2016   NEUTROABS 1.1 (L) 02/13/2016   HGB 10.2 (L) 02/13/2016   HCT 30.1 (L) 02/13/2016   MCV 98.6 02/13/2016   PLT 175 02/13/2016    STUDIES: Ct Chest W Contrast  Result Date: 03/12/2016 CLINICAL DATA:  Restaging lung cancer. Initial diagnosis 1 year ago. No history of surgery. Complete chemotherapy 3 weeks ago. EXAM: CT CHEST WITH CONTRAST TECHNIQUE: Multidetector CT imaging of the chest was  performed during intravenous contrast administration. CONTRAST:  16m ISOVUE-300 IOPAMIDOL (ISOVUE-300) INJECTION 61% COMPARISON:  Chest CT 08/15/2012. Apparently the patient has had multiple more recent CT and PET CTs but these must have been performed at another institution and are not available for direct comparison. FINDINGS: Chest wall: No breast masses, supraclavicular or axillary lymphadenopathy. Multinodular thyroid goiter noted. Cardiovascular: The heart is enlarged but stable. No pericardial effusion. 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 Maximal diameters are 6.5 x 6.0 cm. The dissection was also present on the prior study of 2014 although some mural thrombus has resolved. The dissection involving the descending thoracic aorta has progressed into the abdomen and there are calcifications noted along the intimal  flap. The descending thoracic aorta demonstrates fusiform aneurysmal dilatation which was not present on the prior study. Maximum diameter is 4.2 x 3.8 cm on image number 45. It measured 3.2 x 3.1 cm on the prior CT scan. More recent CT scans may show this to be stable. Three-vessel coronary artery calcifications. Mediastinum/Nodes: No mediastinal lymph lymphadenopathy. A few small scattered lymph nodes are unchanged. The esophagus is grossly normal. Lungs/Pleura: Large left apical necrotic lung mass consistent with recurrent tumor. This measures approximately a 6 x 5.7 cm. Surrounding scarring or radiation changes are noted. Radiation changes involving the left paramediastinal long. Pulmonary nodules are noted consistent with known metastatic disease. The largest lesion in the left lower lobe on image number 70 measures 12 mm. Ill-defined lesion in the right upper lobe on image number 44 measures 6.5 mm. No pleural effusions. Upper Abdomen: Diffuse fatty infiltration of the liver. Bilateral adrenal gland lesions appear stable since 2014.  Musculoskeletal: Severe degenerative changes involving the thoracic spine. 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 No obvious metastatic bone disease. IMPRESSION: 1. Type they aortic dissection progressive since 2014. Maximum diameter of the ascending aorta is 6.5 cm. The dissection has progressed into the descending thoracic aorta and into the upper abdomen. Recommend correlation with more recent CT scans. 2. Large left upper lobe mass consistent with recurrent tumor. 3. Metastatic pulmonary nodules. 4. Stable bilateral adrenal gland lesions. Electronically Signed   By: Marijo Sanes M.D.   On: 03/12/2016 13:40   Ct Outside Films Chest  Result Date: 03/24/2016 CLINICAL DATA:  This exam is stored here for comparison purposes only and was performed at an outside facility.   Please contact the originating institution for any associated interpretation or report.   Nm Outside Films Soft Tissue Neck  Result Date: 03/24/2016 CLINICAL DATA:  This exam is stored here for comparison purposes only and was performed at an outside facility.   Please contact the originating institution for any associated interpretation or report.    ONCOLOGIC HISTORY:   Patient was initially diagnosed with a 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 PET scan on August 25, 2012 revealed a hypermetabolic nodule 2.1 cm in the left apices. Bronchoscopy on August 31, 2012 did not show any evidence of malignancy, but CT-guided biopsy on September 18, 2012 confirmed squamous cell carcinoma. Patient was determined not to be a surgical candidate at that time and underwent SBRT in May 2014. CT scan on April 23, 2013 revealed an enlarging prevascular lymph node. PET scan on June 04, 2013 revealed a new enlarging left lobe paratracheal lymph node with no other evidence of metastatic disease. Subsequent EBUS on June 13, 2013 confirmed recurrent squamous cell carcinoma. Patient underwent XRT along with concurrent navelbine between July 16, 2013 -  August 28, 2013. Patient was then disease-free until CT scan on Sep 30, 2015 revealed an interval increase of a nodule measuring up to 2.3 cm in the left lower lobe. PET scan on Oct 13, 8919 was hypermetabolic in the left upper, left lower and right middle lobe nodules concerning for neoplasm. Patient completed 4 cycles of dose reduced carboplatinum and gemcitabine between July 2017 and September 2017.  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  PLAN:    1. 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 CT scan results reviewed independently and reported as above with what appears to be stable  disease. Difficult to compare since we do not have access to her most recent scan completed at [facility]. After discussion with the patient, it was agreed upon to discontinue chemotherapy at this time and repeat imaging in 3 months to assess for interval change. Patient received carboplatinum AUC 4 on day 1 and gemcitabine 750 mg/m on days 1 and 8. Immunotherapy was considered, but patient's PDL 1 is less than 1%. Patient continues to refuse port placement. Return to clinic 1-2 days after her next imaging in 3 months for further evaluation. 2. Hypertension: Patient's blood pressure continues to be persistently elevated. Continue current medications. Patient admits noncompliance to taking her blood pressure medications.  3. Hypokalemia: Resolved. Patient was previously given dietary recommendations.  4. Anemia: Secondary chemotherapy, monitor. 5. Leukopenia: Also secondary to chemotherapy. Proceed with treatment as above. 6. Aortic aneurysm: Previously evaluated in the past at [facility]. CT scan results indicates progression of her aneurysm. Patient reports surgery was recommended previously, but she declined. She indicated she will also will likely decline any  interventions at this time again, but has agreed to have further evaluation by her thoracic surgeon. A referral has been sent.  Patient expressed understanding and was in agreement with this plan. She also understands that She can call clinic at any time with any questions, concerns, or complaints.    Lloyd Huger, MD   03/27/2016 8:05 AM

## 2016-03-19 ENCOUNTER — Inpatient Hospital Stay: Payer: Commercial Managed Care - HMO | Attending: Oncology | Admitting: Oncology

## 2016-03-19 VITALS — BP 157/85 | HR 49 | Temp 97.4°F | Resp 18 | Wt 197.8 lb

## 2016-03-19 DIAGNOSIS — C7801 Secondary malignant neoplasm of right lung: Secondary | ICD-10-CM

## 2016-03-19 DIAGNOSIS — Z9119 Patient's noncompliance with other medical treatment and regimen: Secondary | ICD-10-CM | POA: Diagnosis not present

## 2016-03-19 DIAGNOSIS — Z87891 Personal history of nicotine dependence: Secondary | ICD-10-CM

## 2016-03-19 DIAGNOSIS — Z7984 Long term (current) use of oral hypoglycemic drugs: Secondary | ICD-10-CM

## 2016-03-19 DIAGNOSIS — Z79899 Other long term (current) drug therapy: Secondary | ICD-10-CM

## 2016-03-19 DIAGNOSIS — K760 Fatty (change of) liver, not elsewhere classified: Secondary | ICD-10-CM

## 2016-03-19 DIAGNOSIS — C3492 Malignant neoplasm of unspecified part of left bronchus or lung: Secondary | ICD-10-CM

## 2016-03-19 DIAGNOSIS — J449 Chronic obstructive pulmonary disease, unspecified: Secondary | ICD-10-CM

## 2016-03-19 DIAGNOSIS — D72819 Decreased white blood cell count, unspecified: Secondary | ICD-10-CM

## 2016-03-19 DIAGNOSIS — I10 Essential (primary) hypertension: Secondary | ICD-10-CM | POA: Diagnosis not present

## 2016-03-19 DIAGNOSIS — Z9221 Personal history of antineoplastic chemotherapy: Secondary | ICD-10-CM | POA: Diagnosis not present

## 2016-03-19 DIAGNOSIS — D6481 Anemia due to antineoplastic chemotherapy: Secondary | ICD-10-CM | POA: Diagnosis not present

## 2016-03-19 DIAGNOSIS — Z7901 Long term (current) use of anticoagulants: Secondary | ICD-10-CM

## 2016-03-19 DIAGNOSIS — C7802 Secondary malignant neoplasm of left lung: Secondary | ICD-10-CM

## 2016-03-19 DIAGNOSIS — I4891 Unspecified atrial fibrillation: Secondary | ICD-10-CM

## 2016-03-19 DIAGNOSIS — E785 Hyperlipidemia, unspecified: Secondary | ICD-10-CM

## 2016-03-19 DIAGNOSIS — E039 Hypothyroidism, unspecified: Secondary | ICD-10-CM

## 2016-03-19 DIAGNOSIS — Z801 Family history of malignant neoplasm of trachea, bronchus and lung: Secondary | ICD-10-CM

## 2016-03-19 DIAGNOSIS — I509 Heart failure, unspecified: Secondary | ICD-10-CM

## 2016-03-19 DIAGNOSIS — I719 Aortic aneurysm of unspecified site, without rupture: Secondary | ICD-10-CM

## 2016-03-19 DIAGNOSIS — E119 Type 2 diabetes mellitus without complications: Secondary | ICD-10-CM

## 2016-03-19 NOTE — Progress Notes (Signed)
Complains of right knee stiffness that is chronic.

## 2016-03-24 ENCOUNTER — Ambulatory Visit
Admission: RE | Admit: 2016-03-24 | Discharge: 2016-03-24 | Disposition: A | Discharge status: Home / Self Care | Payer: Self-pay | Source: Ambulatory Visit | Attending: Oncology | Admitting: Oncology

## 2016-03-24 ENCOUNTER — Other Ambulatory Visit: Payer: Self-pay | Admitting: Oncology

## 2016-03-24 DIAGNOSIS — C3492 Malignant neoplasm of unspecified part of left bronchus or lung: Secondary | ICD-10-CM

## 2016-04-13 ENCOUNTER — Other Ambulatory Visit
Admission: RE | Admit: 2016-04-13 | Discharge: 2016-04-13 | Disposition: A | Discharge status: Home / Self Care | Payer: Commercial Managed Care - HMO | Source: Ambulatory Visit | Attending: Internal Medicine | Admitting: Internal Medicine

## 2016-04-13 DIAGNOSIS — R0789 Other chest pain: Secondary | ICD-10-CM | POA: Diagnosis present

## 2016-04-13 LAB — FIBRIN DERIVATIVES D-DIMER (ARMC ONLY): FIBRIN DERIVATIVES D-DIMER (ARMC): 959 — AB (ref 0–499)

## 2016-04-14 ENCOUNTER — Other Ambulatory Visit: Payer: Self-pay | Admitting: Internal Medicine

## 2016-04-14 ENCOUNTER — Ambulatory Visit
Admission: RE | Admit: 2016-04-14 | Discharge: 2016-04-14 | Disposition: A | Discharge status: Home / Self Care | Payer: Commercial Managed Care - HMO | Source: Ambulatory Visit | Attending: Internal Medicine | Admitting: Internal Medicine

## 2016-04-14 DIAGNOSIS — E279 Disorder of adrenal gland, unspecified: Secondary | ICD-10-CM | POA: Diagnosis not present

## 2016-04-14 DIAGNOSIS — R918 Other nonspecific abnormal finding of lung field: Secondary | ICD-10-CM | POA: Insufficient documentation

## 2016-04-14 DIAGNOSIS — R7989 Other specified abnormal findings of blood chemistry: Secondary | ICD-10-CM

## 2016-04-14 DIAGNOSIS — R079 Chest pain, unspecified: Secondary | ICD-10-CM | POA: Diagnosis present

## 2016-04-14 DIAGNOSIS — I7102 Dissection of abdominal aorta: Secondary | ICD-10-CM | POA: Diagnosis not present

## 2016-04-14 MED ORDER — IOPAMIDOL (ISOVUE-370) INJECTION 76%
75.0000 mL | Freq: Once | INTRAVENOUS | Status: AC | PRN
Start: 2016-04-14 — End: 2016-04-14
  Administered 2016-04-14: 75 mL via INTRAVENOUS

## 2016-04-15 ENCOUNTER — Other Ambulatory Visit: Payer: Self-pay | Admitting: Internal Medicine

## 2016-04-15 DIAGNOSIS — Z1231 Encounter for screening mammogram for malignant neoplasm of breast: Secondary | ICD-10-CM

## 2016-06-15 ENCOUNTER — Telehealth: Payer: Self-pay | Admitting: *Deleted

## 2016-06-15 NOTE — Telephone Encounter (Signed)
Creatinine needed for CT, it is already ordered

## 2016-06-16 ENCOUNTER — Ambulatory Visit: Admission: RE | Admit: 2016-06-16 | Payer: Medicare Other | Source: Ambulatory Visit

## 2016-06-16 ENCOUNTER — Other Ambulatory Visit: Payer: Commercial Managed Care - HMO

## 2016-06-18 ENCOUNTER — Ambulatory Visit: Payer: Commercial Managed Care - HMO | Admitting: Oncology

## 2016-06-22 ENCOUNTER — Ambulatory Visit
Admission: RE | Admit: 2016-06-22 | Discharge: 2016-06-22 | Disposition: A | Discharge status: Home / Self Care | Payer: Medicare Other | Source: Ambulatory Visit | Attending: Oncology | Admitting: Oncology

## 2016-06-22 ENCOUNTER — Inpatient Hospital Stay: Payer: Medicare Other | Attending: Oncology

## 2016-06-22 DIAGNOSIS — Z801 Family history of malignant neoplasm of trachea, bronchus and lung: Secondary | ICD-10-CM | POA: Insufficient documentation

## 2016-06-22 DIAGNOSIS — M8448XA Pathological fracture, other site, initial encounter for fracture: Secondary | ICD-10-CM | POA: Insufficient documentation

## 2016-06-22 DIAGNOSIS — Z87891 Personal history of nicotine dependence: Secondary | ICD-10-CM | POA: Diagnosis not present

## 2016-06-22 DIAGNOSIS — Z7901 Long term (current) use of anticoagulants: Secondary | ICD-10-CM | POA: Insufficient documentation

## 2016-06-22 DIAGNOSIS — D3501 Benign neoplasm of right adrenal gland: Secondary | ICD-10-CM | POA: Insufficient documentation

## 2016-06-22 DIAGNOSIS — I712 Thoracic aortic aneurysm, without rupture: Secondary | ICD-10-CM | POA: Insufficient documentation

## 2016-06-22 DIAGNOSIS — D3502 Benign neoplasm of left adrenal gland: Secondary | ICD-10-CM | POA: Diagnosis not present

## 2016-06-22 DIAGNOSIS — I4891 Unspecified atrial fibrillation: Secondary | ICD-10-CM | POA: Insufficient documentation

## 2016-06-22 DIAGNOSIS — C7802 Secondary malignant neoplasm of left lung: Secondary | ICD-10-CM | POA: Diagnosis not present

## 2016-06-22 DIAGNOSIS — E785 Hyperlipidemia, unspecified: Secondary | ICD-10-CM | POA: Insufficient documentation

## 2016-06-22 DIAGNOSIS — E119 Type 2 diabetes mellitus without complications: Secondary | ICD-10-CM | POA: Insufficient documentation

## 2016-06-22 DIAGNOSIS — D175 Benign lipomatous neoplasm of intra-abdominal organs: Secondary | ICD-10-CM | POA: Insufficient documentation

## 2016-06-22 DIAGNOSIS — D6481 Anemia due to antineoplastic chemotherapy: Secondary | ICD-10-CM | POA: Insufficient documentation

## 2016-06-22 DIAGNOSIS — J449 Chronic obstructive pulmonary disease, unspecified: Secondary | ICD-10-CM | POA: Insufficient documentation

## 2016-06-22 DIAGNOSIS — E039 Hypothyroidism, unspecified: Secondary | ICD-10-CM | POA: Diagnosis not present

## 2016-06-22 DIAGNOSIS — R590 Localized enlarged lymph nodes: Secondary | ICD-10-CM | POA: Insufficient documentation

## 2016-06-22 DIAGNOSIS — Z79899 Other long term (current) drug therapy: Secondary | ICD-10-CM | POA: Insufficient documentation

## 2016-06-22 DIAGNOSIS — Z8781 Personal history of (healed) traumatic fracture: Secondary | ICD-10-CM | POA: Diagnosis not present

## 2016-06-22 DIAGNOSIS — I509 Heart failure, unspecified: Secondary | ICD-10-CM | POA: Insufficient documentation

## 2016-06-22 DIAGNOSIS — C7800 Secondary malignant neoplasm of unspecified lung: Secondary | ICD-10-CM | POA: Insufficient documentation

## 2016-06-22 DIAGNOSIS — C3482 Malignant neoplasm of overlapping sites of left bronchus and lung: Secondary | ICD-10-CM

## 2016-06-22 DIAGNOSIS — C7801 Secondary malignant neoplasm of right lung: Secondary | ICD-10-CM | POA: Insufficient documentation

## 2016-06-22 DIAGNOSIS — C3492 Malignant neoplasm of unspecified part of left bronchus or lung: Secondary | ICD-10-CM | POA: Insufficient documentation

## 2016-06-22 DIAGNOSIS — I10 Essential (primary) hypertension: Secondary | ICD-10-CM | POA: Insufficient documentation

## 2016-06-22 LAB — COMPREHENSIVE METABOLIC PANEL
ALK PHOS: 57 U/L (ref 38–126)
ALT: 13 U/L — AB (ref 14–54)
AST: 16 U/L (ref 15–41)
Albumin: 3.2 g/dL — ABNORMAL LOW (ref 3.5–5.0)
Anion gap: 7 (ref 5–15)
BILIRUBIN TOTAL: 0.5 mg/dL (ref 0.3–1.2)
BUN: 24 mg/dL — ABNORMAL HIGH (ref 6–20)
CALCIUM: 9.2 mg/dL (ref 8.9–10.3)
CO2: 31 mmol/L (ref 22–32)
CREATININE: 0.71 mg/dL (ref 0.44–1.00)
Chloride: 103 mmol/L (ref 101–111)
GFR calc non Af Amer: >60 mL/min (ref >60)
Glucose, Bld: 167 mg/dL — ABNORMAL HIGH (ref 65–99)
Potassium: 4.2 mmol/L (ref 3.5–5.1)
SODIUM: 141 mmol/L (ref 135–145)
TOTAL PROTEIN: 7.8 g/dL (ref 6.5–8.1)

## 2016-06-22 LAB — CBC WITH DIFFERENTIAL/PLATELET
Basophils Absolute: 0 10*3/uL (ref 0–0.1)
Basophils Relative: 0 %
Eosinophils Absolute: 0.1 10*3/uL (ref 0–0.7)
Eosinophils Relative: 2 %
HEMATOCRIT: 38.3 % (ref 35.0–47.0)
HEMOGLOBIN: 12.3 g/dL (ref 12.0–16.0)
LYMPHS ABS: 0.8 10*3/uL — AB (ref 1.0–3.6)
LYMPHS PCT: 11 %
MCH: 28.7 pg (ref 26.0–34.0)
MCHC: 32.1 g/dL (ref 32.0–36.0)
MCV: 89.4 fL (ref 80.0–100.0)
MONOS PCT: 11 %
Monocytes Absolute: 0.8 10*3/uL (ref 0.2–0.9)
NEUTROS ABS: 5.3 10*3/uL (ref 1.4–6.5)
NEUTROS PCT: 76 %
Platelets: 224 10*3/uL (ref 150–440)
RBC: 4.29 MIL/uL (ref 3.80–5.20)
RDW: 17.8 % — ABNORMAL HIGH (ref 11.5–14.5)
WBC: 7.1 10*3/uL (ref 3.6–11.0)

## 2016-06-22 MED ORDER — IOPAMIDOL (ISOVUE-300) INJECTION 61%
75.0000 mL | Freq: Once | INTRAVENOUS | Status: AC | PRN
  Administered 2016-06-22: 75 mL via INTRAVENOUS

## 2016-06-24 NOTE — Progress Notes (Signed)
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  INTERVAL HISTORY: Patient returns to clinic today for further evaluation and discussion of her imaging results. She continues to feel well and is asymptomatic. She has no neurologic complaints. She denies any recent fevers or illnesses. She has a good appetite and denies weight loss. She has no chest pain or shortness of breath. She denies any nausea, vomiting, constipation, or diarrhea. She has no urinary complaints. Patient offers no specific complaints today.   REVIEW OF SYSTEMS:   Review of Systems  Constitutional: Negative.  Negative for fever, malaise/fatigue and weight loss.  Respiratory: Negative for cough and shortness of breath.   Cardiovascular: Negative.  Negative for chest pain.  Gastrointestinal: Negative.  Negative for abdominal pain, nausea and vomiting.  Genitourinary: Negative.   Musculoskeletal: Negative.   Neurological: Negative.  Negative for weakness.  Psychiatric/Behavioral: Negative.  The patient is not nervous/anxious.    As per HPI. Otherwise, a complete review of systems is negative.   PAST MEDICAL HISTORY: Past Medical History:  Diagnosis Date  . Atrial fibrillation ([place])   . CHF (congestive heart failure) ([place])   . COPD (chronic obstructive pulmonary disease) ([place])   . Diabetes mellitus without complication ([place])   . Hyperlipidemia   . Hypertension   . Hypothyroidism   . Lung cancer [facility]) 2015   Dr Merleen Nicely [facility], chemo and rad  . Thyroid disease     PAST SURGICAL HISTORY: Past Surgical History:  Procedure Laterality Date  . COLONOSCOPY  2006   Dr Raynelle Fanning patient ?polyps  . COLONOSCOPY WITH PROPOFOL N/A  01/30/2015   Procedure: COLONOSCOPY WITH PROPOFOL;  Surgeon: Manya Silvas, MD;  Location: [name] ENDOSCOPY;  Service: Endoscopy;  Laterality: N/A;  . ESOPHAGOGASTRODUODENOSCOPY (EGD) WITH PROPOFOL N/A 01/30/2015   Procedure: ESOPHAGOGASTRODUODENOSCOPY (EGD) WITH PROPOFOL;  Surgeon: Manya Silvas, MD;  Location: [facility] ENDOSCOPY;  Service: Endoscopy;  Laterality: N/A;  . JOINT REPLACEMENT    . Left knee surgery Left   . LUNG BIOPSY      FAMILY HISTORY Family History  Problem Relation Age of Onset  . Heart attack Mother   . Lung cancer Father   . Heart attack Son 76       ADVANCED DIRECTIVES:    HEALTH MAINTENANCE: Social History  Substance Use Topics  . Smoking status: Former Smoker    Quit date: 10/29/2013  . Smokeless tobacco: Never Used  . Alcohol use No     Colonoscopy:  PAP:  Bone density:  Lipid panel:  Allergies  Allergen Reactions  . Aleve [Naproxen Sodium]     Pt states can tolerate some, but causes GI upset  . Celebrex [Celecoxib] Other (See Comments)    Pt states can handle some, but causes GI upset    Current Outpatient Prescriptions  Medication Sig Dispense Refill  . atorvastatin (LIPITOR) 10 MG tablet Take 10 mg by mouth daily at 6 PM.     . carvedilol (COREG) 6.25 MG tablet Take 6.25 mg by mouth 2 (two) times daily with a meal.     . Cholecalciferol (VITAMIN D3) 2000 units capsule Take 2,000 Units by mouth daily.     Marland Kitchen dexamethasone (DECADRON) 4 MG tablet Take 8 mg by mouth  daily. Take for 2 days after [place] chemotherapy, then as directed.    . diclofenac sodium (VOLTAREN) 1 % GEL Apply topically.    . empagliflozin (JARDIANCE) 10 MG TABS tablet Take 10 mg by mouth daily.    . furosemide (LASIX) 40 MG tablet Take 40 mg by mouth daily.     Marland Kitchen levothyroxine (SYNTHROID, LEVOTHROID) 50 MCG tablet Take 50 mcg by mouth 2 (two) times daily.     Marland Kitchen losartan (COZAAR) 50 MG tablet Take 50 mg by mouth daily.     . Magnesium 250 MG TABS Take 1 tablet by mouth daily.      . metFORMIN (GLUCOPHAGE-XR) 500 MG 24 hr tablet Take 1,000 mg by mouth daily with breakfast.     . pantoprazole (PROTONIX) 40 MG tablet Take 40 mg by mouth daily.     . prochlorperazine (COMPAZINE) 10 MG tablet Take 10 mg by mouth every 6 (six) hours as needed.     . warfarin (COUMADIN) 4 MG tablet Take 6 mg by mouth one time only at 6 PM.     . traMADol (ULTRAM) 50 MG tablet Take by mouth.     No current facility-administered medications for this visit.     OBJECTIVE: Vitals:   06/25/16 1104  BP: (!) 156/68  Pulse: (!) 53  Resp: 18  Temp: 98.6 F (37 C)     Body mass index is 33.35 kg/m.    ECOG FS:0 - Asymptomatic  General: Well-developed, well-nourished, no acute distress. Eyes: Pink conjunctiva, anicteric sclera. Lungs: Clear to auscultation bilaterally. Heart: Regular rate and rhythm. No rubs, murmurs, or gallops. Abdomen: Soft, nontender, nondistended. No organomegaly noted, normoactive bowel sounds. Musculoskeletal: No edema, cyanosis, or clubbing. Neuro: Alert, answering all questions appropriately. Cranial nerves grossly intact. Skin: No rashes or petechiae noted. Psych: Normal affect.   LAB RESULTS:  Lab Results  Component Value Date   NA 141 06/22/2016   K 4.2 06/22/2016   CL 103 06/22/2016   CO2 31 06/22/2016   GLUCOSE 167 (H) 06/22/2016   BUN 24 (H) 06/22/2016   CREATININE 0.71 06/22/2016   CALCIUM 9.2 06/22/2016   PROT 7.8 06/22/2016   ALBUMIN 3.2 (L) 06/22/2016   AST 16 06/22/2016   ALT 13 (L) 06/22/2016   ALKPHOS 57 06/22/2016   BILITOT 0.5 06/22/2016   GFRNONAA >60 06/22/2016   GFRAA >60 06/22/2016    Lab Results  Component Value Date   WBC 7.1 06/22/2016   NEUTROABS 5.3 06/22/2016   HGB 12.3 06/22/2016   HCT 38.3 06/22/2016   MCV 89.4 06/22/2016   PLT 224 06/22/2016    STUDIES: Ct Chest W Contrast  Result Date: 06/22/2016 CLINICAL DATA:  Recurrent stage IV squamous cell left lung cancer with bilateral lung metastases EXAM: CT  CHEST WITH CONTRAST TECHNIQUE: Multidetector CT imaging of the chest was performed during intravenous contrast administration. CONTRAST:  11m ISOVUE-300 IOPAMIDOL (ISOVUE-300) INJECTION 61% COMPARISON:  04/14/2016 FINDINGS: Cardiovascular: Cardiomegaly.  No pericardial effusion. Three vessel coronary atherosclerosis. Stable type A aortic dissection. Associated 5.8 cm ascending thoracic aortic aneurysm (series 5/image 55), unchanged. Mediastinum/Nodes: Small thoracic lymph nodes, including: --8 mm right supraclavicular node (series 5/ image 9) --8 mm left supraclavicular node (series 5/ image 14) --8 mm high right paratracheal node (series 5/ image 27) --8 mm right hilar node (series 5/image 50) --10 mm subcarinal node (series 5/image 59) Visualized thyroid is unremarkable. Lungs/Pleura: 6.9 x 7.4 cm with a left upper lobe mass, centrally necrotic, abutting/ involving  the left lateral chest wall (series 5/image 25). This previously measured 6.6 x 7.0 cm. 1.4 cm right middle lobe nodule (series 3/image 67), previously 7 mm. 1.5 cm posterior left lower lobe nodule (series 3/image 36), previous 1.3 cm. Patchy opacity in the medial left lower lobe likely atelectasis. Underlying mild mosaic attenuation.  No focal consolidation. No pleural effusion or pneumothorax. Upper Abdomen: Visualized upper abdomen is notable for bilateral adrenal nodules measuring up to 3.0 cm on the left, unchanged, previously characterized as adrenal adenomas. Cholecystectomy clips. Intraluminal lipoma in the second portion of the duodenum (series 5/ image 153). Musculoskeletal: Pathologic fractures involving the left anterolateral 2nd rib (series 5/ image 26) and left lateral 3rd rib (series 5/image 30). Degenerative changes of the visualized thoracolumbar spine. IMPRESSION: 7.4 cm left upper lobe mass, mildly increased, with invasion of the adjacent left lateral chest wall. Associated pathologic fractures of the left 2nd and 3rd ribs. Bilateral  pulmonary metastases, measuring up to 1.5 cm, mildly increased. Mild thoracic lymphadenopathy, mildly increased. Stable type A aortic dissection with associated 5.8 cm ascending thoracic aortic aneurysm, grossly unchanged. Bilateral adrenal adenomas, measuring up to 3.0 cm on the left, unchanged. Electronically Signed   By: Julian Hy M.D.   On: 06/22/2016 12:26    ONCOLOGIC HISTORY:   Patient was initially diagnosed with a 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 PET scan on August 25, 2012 revealed a hypermetabolic nodule 2.1 cm in the left apices. Bronchoscopy on August 31, 2012 did not show any evidence of malignancy, but CT-guided biopsy on September 18, 2012 confirmed squamous cell carcinoma. Patient was determined not to be a surgical candidate at that time and underwent SBRT in May 2014. CT scan on April 23, 2013 revealed an enlarging prevascular lymph node. PET scan on June 04, 2013 revealed a new enlarging left lobe paratracheal lymph node with no other evidence of metastatic disease. Subsequent EBUS on June 13, 2013 confirmed recurrent squamous cell carcinoma. Patient underwent XRT along with concurrent navelbine between July 16, 2013 - August 28, 2013. Patient was then disease-free until CT scan on Sep 30, 2015 revealed an interval increase of a nodule measuring up to 2.3 cm in the left lower lobe. PET scan on Oct 12, 7369 was hypermetabolic in the left upper, left lower and right middle lobe nodules concerning for neoplasm. Patient completed 4 cycles of dose reduced carboplatinum and gemcitabine between July 2017 and September 2017.  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  PLAN:    1. 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 CT scan results reviewed independently and reported as above with what appears to be slight progression  of disease. After lengthy discussion with the patient, she does not wish to reinitiate chemotherapy at this time, but would consider any future if necessary. No intervention is needed. Patient received carboplatinum AUC 4 on day 1 and gemcitabine 750 mg/m on days 1 and 8. Immunotherapy was considered, but patient's PDL 1 is less than 1%. Patient continues to refuse port placement. Return to clinic in 3 months with repeat imaging and further evaluation.  2. Hypertension: Patient's blood pressure continues to be persistently elevated. Continue current medications. Patient admits noncompliance to taking her blood pressure medications.  3. Hypokalemia: Resolved. Patient was previously given dietary recommendations.  4. Anemia: Secondary chemotherapy, monitor. 5. Leukopenia: Resolved.  6. Aortic aneurysm:  Previously evaluated in the past at [facility]. CT scan results indicates progression of her aneurysm. Patient reports surgery was recommended previously, but she declined. She indicated she will also will likely decline any interventions at this time again. Previously a referral was made back to her cardiothoracic surgeon, but patient canceled the appointment.   Patient expressed understanding and was in agreement with this plan. She also understands that She can call clinic at any time with any questions, concerns, or complaints.    Lloyd Huger, MD   06/29/2016 6:31 PM

## 2016-06-25 ENCOUNTER — Inpatient Hospital Stay (HOSPITAL_BASED_OUTPATIENT_CLINIC_OR_DEPARTMENT_OTHER): Payer: Medicare Other | Admitting: Oncology

## 2016-06-25 VITALS — BP 156/68 | HR 53 | Temp 98.6°F | Resp 18 | Wt 188.3 lb

## 2016-06-25 DIAGNOSIS — E039 Hypothyroidism, unspecified: Secondary | ICD-10-CM

## 2016-06-25 DIAGNOSIS — D6481 Anemia due to antineoplastic chemotherapy: Secondary | ICD-10-CM | POA: Diagnosis not present

## 2016-06-25 DIAGNOSIS — C7800 Secondary malignant neoplasm of unspecified lung: Secondary | ICD-10-CM | POA: Diagnosis not present

## 2016-06-25 DIAGNOSIS — Z7901 Long term (current) use of anticoagulants: Secondary | ICD-10-CM

## 2016-06-25 DIAGNOSIS — R590 Localized enlarged lymph nodes: Secondary | ICD-10-CM

## 2016-06-25 DIAGNOSIS — J449 Chronic obstructive pulmonary disease, unspecified: Secondary | ICD-10-CM | POA: Diagnosis not present

## 2016-06-25 DIAGNOSIS — C3492 Malignant neoplasm of unspecified part of left bronchus or lung: Secondary | ICD-10-CM | POA: Diagnosis not present

## 2016-06-25 DIAGNOSIS — D3501 Benign neoplasm of right adrenal gland: Secondary | ICD-10-CM | POA: Diagnosis not present

## 2016-06-25 DIAGNOSIS — I712 Thoracic aortic aneurysm, without rupture: Secondary | ICD-10-CM | POA: Diagnosis not present

## 2016-06-25 DIAGNOSIS — I4891 Unspecified atrial fibrillation: Secondary | ICD-10-CM | POA: Diagnosis not present

## 2016-06-25 DIAGNOSIS — D175 Benign lipomatous neoplasm of intra-abdominal organs: Secondary | ICD-10-CM

## 2016-06-25 DIAGNOSIS — E785 Hyperlipidemia, unspecified: Secondary | ICD-10-CM

## 2016-06-25 DIAGNOSIS — E119 Type 2 diabetes mellitus without complications: Secondary | ICD-10-CM | POA: Diagnosis not present

## 2016-06-25 DIAGNOSIS — Z8781 Personal history of (healed) traumatic fracture: Secondary | ICD-10-CM

## 2016-06-25 DIAGNOSIS — I509 Heart failure, unspecified: Secondary | ICD-10-CM

## 2016-06-25 DIAGNOSIS — I10 Essential (primary) hypertension: Secondary | ICD-10-CM

## 2016-06-25 DIAGNOSIS — Z801 Family history of malignant neoplasm of trachea, bronchus and lung: Secondary | ICD-10-CM

## 2016-06-25 DIAGNOSIS — Z79899 Other long term (current) drug therapy: Secondary | ICD-10-CM

## 2016-06-25 DIAGNOSIS — Z87891 Personal history of nicotine dependence: Secondary | ICD-10-CM

## 2016-07-19 ENCOUNTER — Ambulatory Visit
Admission: RE | Admit: 2016-07-19 | Discharge: 2016-07-19 | Disposition: A | Discharge status: Home / Self Care | Payer: Medicare Other | Source: Ambulatory Visit | Attending: Internal Medicine | Admitting: Internal Medicine

## 2016-07-19 DIAGNOSIS — Z1231 Encounter for screening mammogram for malignant neoplasm of breast: Secondary | ICD-10-CM | POA: Diagnosis present

## 2016-07-19 HISTORY — DX: Personal history of antineoplastic chemotherapy: Z92.21

## 2016-07-19 HISTORY — DX: Personal history of irradiation: Z92.3

## 2016-09-15 ENCOUNTER — Ambulatory Visit: Payer: Medicare Other

## 2016-09-15 ENCOUNTER — Ambulatory Visit
Admission: RE | Admit: 2016-09-15 | Discharge: 2016-09-15 | Disposition: A | Discharge status: Home / Self Care | Payer: Medicare Other | Source: Ambulatory Visit | Attending: Oncology | Admitting: Oncology

## 2016-09-15 ENCOUNTER — Inpatient Hospital Stay: Payer: Medicare Other | Attending: Hematology and Oncology

## 2016-09-15 DIAGNOSIS — C7889 Secondary malignant neoplasm of other digestive organs: Secondary | ICD-10-CM | POA: Diagnosis not present

## 2016-09-15 DIAGNOSIS — I10 Essential (primary) hypertension: Secondary | ICD-10-CM | POA: Insufficient documentation

## 2016-09-15 DIAGNOSIS — E039 Hypothyroidism, unspecified: Secondary | ICD-10-CM | POA: Diagnosis not present

## 2016-09-15 DIAGNOSIS — Z9221 Personal history of antineoplastic chemotherapy: Secondary | ICD-10-CM | POA: Insufficient documentation

## 2016-09-15 DIAGNOSIS — Z87891 Personal history of nicotine dependence: Secondary | ICD-10-CM | POA: Insufficient documentation

## 2016-09-15 DIAGNOSIS — C7802 Secondary malignant neoplasm of left lung: Secondary | ICD-10-CM | POA: Insufficient documentation

## 2016-09-15 DIAGNOSIS — C3492 Malignant neoplasm of unspecified part of left bronchus or lung: Secondary | ICD-10-CM | POA: Insufficient documentation

## 2016-09-15 DIAGNOSIS — J449 Chronic obstructive pulmonary disease, unspecified: Secondary | ICD-10-CM | POA: Diagnosis not present

## 2016-09-15 DIAGNOSIS — E042 Nontoxic multinodular goiter: Secondary | ICD-10-CM | POA: Diagnosis not present

## 2016-09-15 DIAGNOSIS — C7801 Secondary malignant neoplasm of right lung: Secondary | ICD-10-CM | POA: Diagnosis not present

## 2016-09-15 DIAGNOSIS — Z9119 Patient's noncompliance with other medical treatment and regimen: Secondary | ICD-10-CM | POA: Insufficient documentation

## 2016-09-15 DIAGNOSIS — D649 Anemia, unspecified: Secondary | ICD-10-CM | POA: Insufficient documentation

## 2016-09-15 DIAGNOSIS — I4891 Unspecified atrial fibrillation: Secondary | ICD-10-CM | POA: Insufficient documentation

## 2016-09-15 DIAGNOSIS — Z7984 Long term (current) use of oral hypoglycemic drugs: Secondary | ICD-10-CM | POA: Diagnosis not present

## 2016-09-15 DIAGNOSIS — Z923 Personal history of irradiation: Secondary | ICD-10-CM | POA: Insufficient documentation

## 2016-09-15 DIAGNOSIS — E785 Hyperlipidemia, unspecified: Secondary | ICD-10-CM | POA: Insufficient documentation

## 2016-09-15 DIAGNOSIS — I509 Heart failure, unspecified: Secondary | ICD-10-CM | POA: Insufficient documentation

## 2016-09-15 DIAGNOSIS — E279 Disorder of adrenal gland, unspecified: Secondary | ICD-10-CM | POA: Diagnosis not present

## 2016-09-15 DIAGNOSIS — E119 Type 2 diabetes mellitus without complications: Secondary | ICD-10-CM | POA: Insufficient documentation

## 2016-09-15 DIAGNOSIS — Z7901 Long term (current) use of anticoagulants: Secondary | ICD-10-CM | POA: Insufficient documentation

## 2016-09-15 DIAGNOSIS — C3482 Malignant neoplasm of overlapping sites of left bronchus and lung: Secondary | ICD-10-CM

## 2016-09-15 DIAGNOSIS — I719 Aortic aneurysm of unspecified site, without rupture: Secondary | ICD-10-CM | POA: Diagnosis not present

## 2016-09-15 LAB — COMPREHENSIVE METABOLIC PANEL
ALBUMIN: 3 g/dL — AB (ref 3.5–5.0)
ALT: 11 U/L — ABNORMAL LOW (ref 14–54)
AST: 16 U/L (ref 15–41)
Alkaline Phosphatase: 57 U/L (ref 38–126)
Anion gap: 5 (ref 5–15)
BILIRUBIN TOTAL: 0.6 mg/dL (ref 0.3–1.2)
BUN: 19 mg/dL (ref 6–20)
CO2: 32 mmol/L (ref 22–32)
Calcium: 9.1 mg/dL (ref 8.9–10.3)
Chloride: 99 mmol/L — ABNORMAL LOW (ref 101–111)
Creatinine, Ser: 0.56 mg/dL (ref 0.44–1.00)
GFR calc Af Amer: >60 mL/min (ref >60)
GFR calc non Af Amer: >60 mL/min (ref >60)
Glucose, Bld: 177 mg/dL — ABNORMAL HIGH (ref 65–99)
POTASSIUM: 4 mmol/L (ref 3.5–5.1)
Sodium: 136 mmol/L (ref 135–145)
Total Protein: 7.6 g/dL (ref 6.5–8.1)

## 2016-09-15 LAB — CBC WITH DIFFERENTIAL/PLATELET
BASOS ABS: 0 10*3/uL (ref 0–0.1)
BASOS PCT: 1 %
Eosinophils Absolute: 0.1 10*3/uL (ref 0–0.7)
Eosinophils Relative: 2 %
HEMATOCRIT: 35.3 % (ref 35.0–47.0)
Hemoglobin: 11.2 g/dL — ABNORMAL LOW (ref 12.0–16.0)
Lymphocytes Relative: 9 %
Lymphs Abs: 0.7 10*3/uL — ABNORMAL LOW (ref 1.0–3.6)
MCH: 26.9 pg (ref 26.0–34.0)
MCHC: 31.7 g/dL — ABNORMAL LOW (ref 32.0–36.0)
MCV: 84.9 fL (ref 80.0–100.0)
MONO ABS: 0.9 10*3/uL (ref 0.2–0.9)
Monocytes Relative: 11 %
NEUTROS ABS: 6 10*3/uL (ref 1.4–6.5)
Neutrophils Relative %: 77 %
Platelets: 256 10*3/uL (ref 150–440)
RBC: 4.16 MIL/uL (ref 3.80–5.20)
RDW: 19.6 % — AB (ref 11.5–14.5)
WBC: 7.8 10*3/uL (ref 3.6–11.0)

## 2016-09-15 MED ORDER — IOPAMIDOL (ISOVUE-300) INJECTION 61%
75.0000 mL | Freq: Once | INTRAVENOUS | Status: AC | PRN
  Administered 2016-09-15: 75 mL via INTRAVENOUS

## 2016-09-22 NOTE — Progress Notes (Signed)
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  INTERVAL HISTORY: Patient returns to clinic today for further evaluation and discussion of her imaging results. She continues to feel well and is asymptomatic. She has no neurologic complaints. She denies any recent fevers or illnesses. She has a good appetite and denies weight loss. She has no chest pain or shortness of breath. She denies any nausea, vomiting, constipation, or diarrhea. She has no urinary complaints. Patient offers no specific complaints today.   REVIEW OF SYSTEMS:   Review of Systems  Constitutional: Negative.  Negative for fever, malaise/fatigue and weight loss.  Respiratory: Negative for cough and shortness of breath.   Cardiovascular: Negative.  Negative for chest pain and leg swelling.  Gastrointestinal: Negative.  Negative for abdominal pain, nausea and vomiting.  Genitourinary: Negative.   Musculoskeletal: Negative.   Skin: Negative.  Negative for rash.  Neurological: Negative.  Negative for sensory change and weakness.  Psychiatric/Behavioral: Negative.  The patient is not nervous/anxious.    As per HPI. Otherwise, a complete review of systems is negative.   PAST MEDICAL HISTORY: Past Medical History:  Diagnosis Date  . Atrial fibrillation ([place])   . CHF (congestive heart failure) ([place])   . COPD (chronic obstructive pulmonary disease) ([place])   . Diabetes mellitus without complication ([place])   . Hyperlipidemia   . Hypertension   . Hypothyroidism   . Lung cancer [facility]) 2015   Dr Merleen Nicely [facility], chemo and rad  . Personal history of chemotherapy   . Personal history of radiation therapy   . Thyroid disease     PAST  SURGICAL HISTORY: Past Surgical History:  Procedure Laterality Date  . COLONOSCOPY  2006   Dr Raynelle Fanning patient ?polyps  . COLONOSCOPY WITH PROPOFOL N/A 01/30/2015   Procedure: COLONOSCOPY WITH PROPOFOL;  Surgeon: Manya Silvas, MD;  Location: [facility] ENDOSCOPY;  Service: Endoscopy;  Laterality: N/A;  . ESOPHAGOGASTRODUODENOSCOPY (EGD) WITH PROPOFOL N/A 01/30/2015   Procedure: ESOPHAGOGASTRODUODENOSCOPY (EGD) WITH PROPOFOL;  Surgeon: Manya Silvas, MD;  Location: [facility] ENDOSCOPY;  Service: Endoscopy;  Laterality: N/A;  . JOINT REPLACEMENT    . Left knee surgery Left   . LUNG BIOPSY      FAMILY HISTORY Family History  Problem Relation Age of Onset  . Heart attack Mother   . Lung cancer Father   . Heart attack Son 90  . Breast cancer Neg Hx        ADVANCED DIRECTIVES:    HEALTH MAINTENANCE: Social History  Substance Use Topics  . Smoking status: Former Smoker    Quit date: 10/29/2013  . Smokeless tobacco: Never Used  . Alcohol use No     Colonoscopy:  PAP:  Bone density:  Lipid panel:  Allergies  Allergen Reactions  . Aleve [Naproxen Sodium]     Pt states can tolerate some, but causes GI upset  . Celebrex [Celecoxib] Other (See Comments)    Pt states can handle some, but causes GI upset    Current Outpatient Prescriptions  Medication Sig Dispense Refill  . atorvastatin (LIPITOR) 10 MG tablet Take 10 mg by mouth daily at 6 PM.     . carvedilol (COREG) 6.25 MG tablet Take 6.25 mg by mouth 2 (two) times daily  with a meal.     . Cholecalciferol (VITAMIN D3) 2000 units capsule Take 2,000 Units by mouth daily.     Marland Kitchen dexamethasone (DECADRON) 4 MG tablet Take 8 mg by mouth daily. Take for 2 days after [place] chemotherapy, then as directed.    . diclofenac sodium (VOLTAREN) 1 % GEL Apply topically.    . empagliflozin (JARDIANCE) 10 MG TABS tablet Take 10 mg by mouth daily.    . furosemide (LASIX) 40 MG tablet Take 40 mg by mouth daily.     Marland Kitchen levothyroxine (SYNTHROID,  LEVOTHROID) 50 MCG tablet Take 25 mcg by mouth daily.     Marland Kitchen losartan (COZAAR) 50 MG tablet Take 50 mg by mouth daily.     . Magnesium 250 MG TABS Take 1 tablet by mouth daily.     . metFORMIN (GLUCOPHAGE-XR) 500 MG 24 hr tablet Take 1,000 mg by mouth daily with breakfast.     . pantoprazole (PROTONIX) 40 MG tablet Take 40 mg by mouth daily.     . prochlorperazine (COMPAZINE) 10 MG tablet Take 10 mg by mouth every 6 (six) hours as needed.     . traMADol (ULTRAM) 50 MG tablet Take by mouth.    . warfarin (COUMADIN) 4 MG tablet Take 6 mg by mouth one time only at 6 PM.      No current facility-administered medications for this visit.     OBJECTIVE: Vitals:   09/24/16 1038  BP: (!) 168/83  Pulse: (!) 59  Resp: 18  Temp: 97.1 F (36.2 C)     Body mass index is 34.37 kg/m.    ECOG FS:0 - Asymptomatic  General: Well-developed, well-nourished, no acute distress. Eyes: Pink conjunctiva, anicteric sclera. Lungs: Clear to auscultation bilaterally. Heart: Regular rate and rhythm. No rubs, murmurs, or gallops. Abdomen: Soft, nontender, nondistended. No organomegaly noted, normoactive bowel sounds. Musculoskeletal: No edema, cyanosis, or clubbing. Neuro: Alert, answering all questions appropriately. Cranial nerves grossly intact. Skin: No rashes or petechiae noted. Psych: Normal affect.   LAB RESULTS:  Lab Results  Component Value Date   NA 136 09/15/2016   K 4.0 09/15/2016   CL 99 (L) 09/15/2016   CO2 32 09/15/2016   GLUCOSE 177 (H) 09/15/2016   BUN 19 09/15/2016   CREATININE 0.56 09/15/2016   CALCIUM 9.1 09/15/2016   PROT 7.6 09/15/2016   ALBUMIN 3.0 (L) 09/15/2016   AST 16 09/15/2016   ALT 11 (L) 09/15/2016   ALKPHOS 57 09/15/2016   BILITOT 0.6 09/15/2016   GFRNONAA >60 09/15/2016   GFRAA >60 09/15/2016    Lab Results  Component Value Date   WBC 7.8 09/15/2016   NEUTROABS 6.0 09/15/2016   HGB 11.2 (L) 09/15/2016   HCT 35.3 09/15/2016   MCV 84.9 09/15/2016   PLT 256  09/15/2016    STUDIES: Ct Chest W Contrast  Result Date: 09/15/2016 CLINICAL DATA:  Metastatic left-sided lung cancer. EXAM: CT CHEST WITH CONTRAST TECHNIQUE: Multidetector CT imaging of the chest was performed during intravenous contrast administration. CONTRAST:  36m ISOVUE-300 IOPAMIDOL (ISOVUE-300) INJECTION 61% COMPARISON:  06/22/2016 FINDINGS: Cardiovascular: Heart is enlarged. Coronary artery calcification is noted. Stable appearance of thoracic aortic dissection with dissection flap extending off the bottom of this study in the abdominal aorta. Mediastinum/Nodes: 8 mm short axis right thoracic inlet lymph node is stable. 8 mm short axis right paratracheal lymph node measured previously is 7 mm today. Low right paratracheal lymph node measured previously at 6 mm is stable. 10 mm  short axis subcarinal lymph node measured previously is 9 mm today. Bilateral thyroid nodules again noted. Lungs/Pleura: Left apical lesion measured on soft tissue windows previously at 6.9 x 7.4 cm is now 7.4 x 8.4 cm. Lesion extends into the lateral thoracic sidewall with destruction of the second and third ribs. Persistent central necrosis/cavitation evident. Bilateral pulmonary metastases are again noted. Posterior right middle lobe lesion adjacent to the fissure measured previously at 14 mm is now 25 mm in the same dimension. Posterior left lower lobe nodule measured previously at 15 mm canal 18 mm. Upper Abdomen: 5.6 x 6.7 cm lesion in the medial spleen measured about 2.5 x 3.0 cm on the prior study. 3 cm left adrenal lesion is not substantially changed 2.9 cm today. Small right adrenal nodule also stable. Musculoskeletal: Bone windows reveal no worrisome sclerotic osseous lesions. Lytic abnormality in the left second and third ribs again noted. IMPRESSION: 1. Interval progression of disease. Dominant cavitary/necrotic left apical nodule has progressed in the interval with evidence of chest wall involvement. Bilateral  pulmonary metastases and splenic metastasis also show interval progression. 2. Stable bilateral adrenal nodules previously characterized as adenomas. 3. No substantial change thoracic aortic aneurysm/dissection Electronically Signed   By: Misty Stanley M.D.   On: 09/15/2016 13:31    ONCOLOGIC HISTORY:   Patient was initially diagnosed with a 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 PET scan on August 25, 2012 revealed a hypermetabolic nodule 2.1 cm in the left apices. Bronchoscopy on August 31, 2012 did not show any evidence of malignancy, but CT-guided biopsy on September 18, 2012 confirmed squamous cell carcinoma. Patient was determined not to be a surgical candidate at that time and underwent SBRT in May 2014. CT scan on April 23, 2013 revealed an enlarging prevascular lymph node. PET scan on June 04, 2013 revealed a new enlarging left lobe paratracheal lymph node with no other evidence of metastatic disease. Subsequent EBUS on June 13, 2013 confirmed recurrent squamous cell carcinoma. Patient underwent XRT along with concurrent navelbine between July 16, 2013 - August 28, 2013. Patient was then disease-free until CT scan on Sep 30, 2015 revealed an interval increase of a nodule measuring up to 2.3 cm in the left lower lobe. PET scan on Oct 13, 9379 was hypermetabolic in the left upper, left lower and right middle lobe nodules concerning for neoplasm. Patient completed 4 cycles of dose reduced carboplatinum and gemcitabine between July 2017 and September 2017. Patient initiated [place] on Oct 08, 2016.  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  PLAN:    1. 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 CT scan results reviewed independently and reported as above with clear progression of disease. After lengthy discussion with the patient, she has  agreed to reinitiate treatment with immunotherapy. Patient continues to refuse port placement. Will give Tecentriq every 3 weeks and re-image after 4-6 cycles. Return to clinic on Oct 08, 2016 to initiate cycle 1 of Tecentriq.  2. Hypertension: Patient's blood pressure continues to be persistently elevated. Continue current medications. Patient admits noncompliance to taking her blood pressure medications.  3. Hypokalemia: Resolved. Patient was previously given dietary recommendations.  4. Anemia: Mild, monitor. 5. Leukopenia: Resolved.  6. Aortic aneurysm: Previously evaluated in the past at [facility]. CT scan results indicate stability of her aneurysm. Patient reports surgery was recommended previously, but she  declined. She indicated she will also will likely decline any interventions at this time again. Previously a referral was made, back to her cardiothoracic surgeon, but patient canceled the appointment.  Approximately 30 minutes was spent in discussion of which greater than 50% was consultation.  Patient expressed understanding and was in agreement with this plan. She also understands that She can call clinic at any time with any questions, concerns, or complaints.    Lloyd Huger, MD   09/26/2016 5:11 PM

## 2016-09-24 ENCOUNTER — Inpatient Hospital Stay (HOSPITAL_BASED_OUTPATIENT_CLINIC_OR_DEPARTMENT_OTHER): Payer: Medicare Other | Admitting: Oncology

## 2016-09-24 VITALS — BP 168/83 | HR 59 | Temp 97.1°F | Resp 18 | Wt 194.0 lb

## 2016-09-24 DIAGNOSIS — C7802 Secondary malignant neoplasm of left lung: Secondary | ICD-10-CM

## 2016-09-24 DIAGNOSIS — E785 Hyperlipidemia, unspecified: Secondary | ICD-10-CM

## 2016-09-24 DIAGNOSIS — Z7901 Long term (current) use of anticoagulants: Secondary | ICD-10-CM

## 2016-09-24 DIAGNOSIS — E039 Hypothyroidism, unspecified: Secondary | ICD-10-CM

## 2016-09-24 DIAGNOSIS — J449 Chronic obstructive pulmonary disease, unspecified: Secondary | ICD-10-CM

## 2016-09-24 DIAGNOSIS — I10 Essential (primary) hypertension: Secondary | ICD-10-CM

## 2016-09-24 DIAGNOSIS — I509 Heart failure, unspecified: Secondary | ICD-10-CM

## 2016-09-24 DIAGNOSIS — Z9221 Personal history of antineoplastic chemotherapy: Secondary | ICD-10-CM

## 2016-09-24 DIAGNOSIS — C3492 Malignant neoplasm of unspecified part of left bronchus or lung: Secondary | ICD-10-CM

## 2016-09-24 DIAGNOSIS — C7801 Secondary malignant neoplasm of right lung: Secondary | ICD-10-CM

## 2016-09-24 DIAGNOSIS — I4891 Unspecified atrial fibrillation: Secondary | ICD-10-CM

## 2016-09-24 DIAGNOSIS — Z9119 Patient's noncompliance with other medical treatment and regimen: Secondary | ICD-10-CM

## 2016-09-24 DIAGNOSIS — Z923 Personal history of irradiation: Secondary | ICD-10-CM

## 2016-09-24 DIAGNOSIS — E279 Disorder of adrenal gland, unspecified: Secondary | ICD-10-CM

## 2016-09-24 DIAGNOSIS — E042 Nontoxic multinodular goiter: Secondary | ICD-10-CM

## 2016-09-24 DIAGNOSIS — D649 Anemia, unspecified: Secondary | ICD-10-CM

## 2016-09-24 DIAGNOSIS — Z87891 Personal history of nicotine dependence: Secondary | ICD-10-CM

## 2016-09-24 DIAGNOSIS — E119 Type 2 diabetes mellitus without complications: Secondary | ICD-10-CM

## 2016-09-24 DIAGNOSIS — I719 Aortic aneurysm of unspecified site, without rupture: Secondary | ICD-10-CM | POA: Diagnosis not present

## 2016-09-24 DIAGNOSIS — Z7984 Long term (current) use of oral hypoglycemic drugs: Secondary | ICD-10-CM

## 2016-09-26 NOTE — Progress Notes (Signed)
START ON PATHWAY REGIMEN - Non-Small Cell Lung     A cycle is every 21 days:     Atezolizumab   **Always confirm dose/schedule in your pharmacy ordering system**    Patient Characteristics: Stage IV Metastatic, Squamous, PS = 0, 1, Third Line, No Prior PD-1/PD-L1  Inhibitor and Immunotherapy Candidate AJCC T Category: TX Current Disease Status: Distant Metastases AJCC N Category: NX AJCC M Category: M1a AJCC 8 Stage Grouping: IVA Histology: Squamous Cell Line of therapy: Third Line PD-L1 Expression Status: PD-L1 Negative Performance Status: PS = 0, 1 Would you be surprised if this patient died  in the next year? I would NOT be surprised if this patient died in the next year Immunotherapy Candidate Status: Candidate for Immunotherapy Prior Immunotherapy Status: No Prior PD-1/PD-L1 Inhibitor  Intent of Therapy: Non-Curative / Palliative Intent, Discussed with Patient

## 2016-09-26 NOTE — Progress Notes (Signed)
Patient on plan of care prior to pathways. 

## 2016-10-06 ENCOUNTER — Other Ambulatory Visit: Payer: Self-pay | Admitting: Oncology

## 2016-10-06 NOTE — Progress Notes (Signed)
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  INTERVAL HISTORY: Patient returns to clinic today for further evaluation and initiation of second line, palliative immunotherapy with Tecentriq. She continues to feel well and is asymptomatic. She has no neurologic complaints. She denies any recent fevers or illnesses. She has a good appetite and denies weight loss. She has no chest pain or shortness of breath. She denies any nausea, vomiting, constipation, or diarrhea. She has no urinary complaints. Patient offers no specific complaints today.   REVIEW OF SYSTEMS:   Review of Systems  Constitutional: Negative.  Negative for fever, malaise/fatigue and weight loss.  Respiratory: Negative for cough and shortness of breath.   Cardiovascular: Negative.  Negative for chest pain and leg swelling.  Gastrointestinal: Negative.  Negative for abdominal pain, nausea and vomiting.  Genitourinary: Negative.   Musculoskeletal: Negative.   Skin: Negative.  Negative for rash.  Neurological: Negative.  Negative for sensory change and weakness.  Psychiatric/Behavioral: Negative.  The patient is not nervous/anxious.    As per HPI. Otherwise, a complete review of systems is negative.   PAST MEDICAL HISTORY: Past Medical History:  Diagnosis Date  . Atrial fibrillation ([place])   . CHF (congestive heart failure) ([place])   . COPD (chronic obstructive pulmonary disease) ([place])   . Diabetes mellitus without complication ([place])   . Hyperlipidemia   . Hypertension   . Hypothyroidism   . Lung cancer [facility]) 2015   Dr Merleen Nicely [facility], chemo and rad  . Personal history of chemotherapy   . Personal history of radiation therapy   .  Thyroid disease     PAST SURGICAL HISTORY: Past Surgical History:  Procedure Laterality Date  . COLONOSCOPY  2006   Dr Raynelle Fanning patient ?polyps  . COLONOSCOPY WITH PROPOFOL N/A 01/30/2015   Procedure: COLONOSCOPY WITH PROPOFOL;  Surgeon: Manya Silvas, MD;  Location: [facility] ENDOSCOPY;  Service: Endoscopy;  Laterality: N/A;  . ESOPHAGOGASTRODUODENOSCOPY (EGD) WITH PROPOFOL N/A 01/30/2015   Procedure: ESOPHAGOGASTRODUODENOSCOPY (EGD) WITH PROPOFOL;  Surgeon: Manya Silvas, MD;  Location: [facility] ENDOSCOPY;  Service: Endoscopy;  Laterality: N/A;  . JOINT REPLACEMENT    . Left knee surgery Left   . LUNG BIOPSY      FAMILY HISTORY Family History  Problem Relation Age of Onset  . Heart attack Mother   . Lung cancer Father   . Heart attack Son 86  . Breast cancer Neg Hx        ADVANCED DIRECTIVES:    HEALTH MAINTENANCE: Social History  Substance Use Topics  . Smoking status: Former Smoker    Quit date: 10/29/2013  . Smokeless tobacco: Never Used  . Alcohol use No     Colonoscopy:  PAP:  Bone density:  Lipid panel:  Allergies  Allergen Reactions  . Aleve [Naproxen Sodium]     Pt states can tolerate some, but causes GI upset  . Celebrex [Celecoxib] Other (See Comments)    Pt states can handle some, but causes GI upset    Current Outpatient Prescriptions  Medication Sig Dispense Refill  . atorvastatin (LIPITOR) 10 MG tablet Take 10 mg by mouth daily at 6 PM.     . carvedilol (COREG) 6.25 MG tablet Take 6.25 mg by mouth 2 (  two) times daily with a meal.     . Cholecalciferol (VITAMIN D3) 2000 units capsule Take 2,000 Units by mouth daily.     Marland Kitchen dexamethasone (DECADRON) 4 MG tablet Take 8 mg by mouth daily. Take for 2 days after [place] chemotherapy, then as directed.    . diclofenac sodium (VOLTAREN) 1 % GEL Apply topically.    . empagliflozin (JARDIANCE) 10 MG TABS tablet Take 10 mg by mouth daily.    . furosemide (LASIX) 40 MG tablet Take 40 mg by mouth daily.     Marland Kitchen  levothyroxine (SYNTHROID, LEVOTHROID) 50 MCG tablet Take 25 mcg by mouth daily.     Marland Kitchen losartan (COZAAR) 50 MG tablet Take 50 mg by mouth daily.     . Magnesium 250 MG TABS Take 1 tablet by mouth daily.     . metFORMIN (GLUCOPHAGE-XR) 500 MG 24 hr tablet Take 1,000 mg by mouth daily with breakfast.     . pantoprazole (PROTONIX) 40 MG tablet Take 40 mg by mouth daily.     . prochlorperazine (COMPAZINE) 10 MG tablet Take 10 mg by mouth every 6 (six) hours as needed.     . traMADol (ULTRAM) 50 MG tablet Take by mouth.    . warfarin (COUMADIN) 4 MG tablet Take 6 mg by mouth one time only at 6 PM.      No current facility-administered medications for this visit.     OBJECTIVE: Vitals:   10/08/16 0857  BP: (!) 182/92  Pulse: 72  Temp: 97.6 F (36.4 C)     Body mass index is 34.09 kg/m.    ECOG FS:0 - Asymptomatic  General: Well-developed, well-nourished, no acute distress. Eyes: Pink conjunctiva, anicteric sclera. Lungs: Clear to auscultation bilaterally. Heart: Regular rate and rhythm. No rubs, murmurs, or gallops. Abdomen: Soft, nontender, nondistended. No organomegaly noted, normoactive bowel sounds. Musculoskeletal: No edema, cyanosis, or clubbing. Neuro: Alert, answering all questions appropriately. Cranial nerves grossly intact. Skin: No rashes or petechiae noted. Psych: Normal affect.   LAB RESULTS:  Lab Results  Component Value Date   NA 138 10/08/2016   K 3.8 10/08/2016   CL 100 (L) 10/08/2016   CO2 30 10/08/2016   GLUCOSE 177 (H) 10/08/2016   BUN 15 10/08/2016   CREATININE 0.58 10/08/2016   CALCIUM 9.0 10/08/2016   PROT 7.7 10/08/2016   ALBUMIN 2.8 (L) 10/08/2016   AST 15 10/08/2016   ALT 12 (L) 10/08/2016   ALKPHOS 53 10/08/2016   BILITOT 0.2 (L) 10/08/2016   GFRNONAA >60 10/08/2016   GFRAA >60 10/08/2016    Lab Results  Component Value Date   WBC 7.4 10/08/2016   NEUTROABS 5.8 10/08/2016   HGB 11.0 (L) 10/08/2016   HCT 34.9 (L) 10/08/2016   MCV 82.9  10/08/2016   PLT 254 10/08/2016    STUDIES: Ct Chest W Contrast  Result Date: 09/15/2016 CLINICAL DATA:  Metastatic left-sided lung cancer. EXAM: CT CHEST WITH CONTRAST TECHNIQUE: Multidetector CT imaging of the chest was performed during intravenous contrast administration. CONTRAST:  30m ISOVUE-300 IOPAMIDOL (ISOVUE-300) INJECTION 61% COMPARISON:  06/22/2016 FINDINGS: Cardiovascular: Heart is enlarged. Coronary artery calcification is noted. Stable appearance of thoracic aortic dissection with dissection flap extending off the bottom of this study in the abdominal aorta. Mediastinum/Nodes: 8 mm short axis right thoracic inlet lymph node is stable. 8 mm short axis right paratracheal lymph node measured previously is 7 mm today. Low right paratracheal lymph node measured previously at 6 mm is stable. 10  mm short axis subcarinal lymph node measured previously is 9 mm today. Bilateral thyroid nodules again noted. Lungs/Pleura: Left apical lesion measured on soft tissue windows previously at 6.9 x 7.4 cm is now 7.4 x 8.4 cm. Lesion extends into the lateral thoracic sidewall with destruction of the second and third ribs. Persistent central necrosis/cavitation evident. Bilateral pulmonary metastases are again noted. Posterior right middle lobe lesion adjacent to the fissure measured previously at 14 mm is now 25 mm in the same dimension. Posterior left lower lobe nodule measured previously at 15 mm canal 18 mm. Upper Abdomen: 5.6 x 6.7 cm lesion in the medial spleen measured about 2.5 x 3.0 cm on the prior study. 3 cm left adrenal lesion is not substantially changed 2.9 cm today. Small right adrenal nodule also stable. Musculoskeletal: Bone windows reveal no worrisome sclerotic osseous lesions. Lytic abnormality in the left second and third ribs again noted. IMPRESSION: 1. Interval progression of disease. Dominant cavitary/necrotic left apical nodule has progressed in the interval with evidence of chest wall  involvement. Bilateral pulmonary metastases and splenic metastasis also show interval progression. 2. Stable bilateral adrenal nodules previously characterized as adenomas. 3. No substantial change thoracic aortic aneurysm/dissection Electronically Signed   By: Misty Stanley M.D.   On: 09/15/2016 13:31    ONCOLOGIC HISTORY:   Patient was initially diagnosed with a 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 PET scan on August 25, 2012 revealed a hypermetabolic nodule 2.1 cm in the left apices. Bronchoscopy on August 31, 2012 did not show any evidence of malignancy, but CT-guided biopsy on September 18, 2012 confirmed squamous cell carcinoma. Patient was determined not to be a surgical candidate at that time and underwent SBRT in May 2014. CT scan on April 23, 2013 revealed an enlarging prevascular lymph node. PET scan on June 04, 2013 revealed a new enlarging left lobe paratracheal lymph node with no other evidence of metastatic disease. Subsequent EBUS on June 13, 2013 confirmed recurrent squamous cell carcinoma. Patient underwent XRT along with concurrent navelbine between July 16, 2013 - August 28, 2013. Patient was then disease-free until CT scan on Sep 30, 2015 revealed an interval increase of a nodule measuring up to 2.3 cm in the left lower lobe. PET scan on Oct 12, 3149 was hypermetabolic in the left upper, left lower and right middle lobe nodules concerning for neoplasm. Patient completed 4 cycles of dose reduced carboplatinum and gemcitabine between July 2017 and September 2017. Patient initiated [place] on Oct 08, 2016.  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  PLAN:    1. 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 CT scan results reviewed independently and reported as above with clear progression of disease. After lengthy discussion with  the patient, she has agreed to reinitiate treatment with immunotherapy. Patient continues to refuse port placement. Will give Tecentriq every 3 weeks and re-image after 4-6 cycles. Proceed with cycle 1 of Tecentriq today. Return to clinic in 3 weeks for consideration of cycle 2.  2. Hypertension: Patient's blood pressure continues to be persistently elevated. Continue current medications. Patient admits noncompliance to taking her blood pressure medications.  3. Hypokalemia: Resolved. Patient was previously given dietary recommendations.  4. Anemia: Mild, monitor. 5. Aortic aneurysm: Previously evaluated in the past at [facility] CT scan results indicate stability of her aneurysm. Patient reports surgery was recommended previously,  but she declined. She indicated she will also will likely decline any interventions at this time again. Previously a referral was made, back to her cardiothoracic surgeon, but patient canceled the appointment.  Approximately 30 minutes was spent in discussion of which greater than 50% was consultation.  Patient expressed understanding and was in agreement with this plan. She also understands that She can call clinic at any time with any questions, concerns, or complaints.    Lloyd Huger, MD   10/09/2016 8:55 AM

## 2016-10-07 ENCOUNTER — Other Ambulatory Visit: Payer: Self-pay | Admitting: Oncology

## 2016-10-08 ENCOUNTER — Inpatient Hospital Stay: Payer: Medicare Other | Attending: Oncology | Admitting: Oncology

## 2016-10-08 ENCOUNTER — Inpatient Hospital Stay: Payer: Medicare Other

## 2016-10-08 VITALS — BP 182/92 | HR 72 | Temp 97.6°F | Wt 192.5 lb

## 2016-10-08 VITALS — BP 149/68 | HR 68 | Temp 97.4°F | Resp 18

## 2016-10-08 DIAGNOSIS — C7889 Secondary malignant neoplasm of other digestive organs: Secondary | ICD-10-CM | POA: Diagnosis not present

## 2016-10-08 DIAGNOSIS — I4891 Unspecified atrial fibrillation: Secondary | ICD-10-CM | POA: Diagnosis not present

## 2016-10-08 DIAGNOSIS — E119 Type 2 diabetes mellitus without complications: Secondary | ICD-10-CM | POA: Insufficient documentation

## 2016-10-08 DIAGNOSIS — C7802 Secondary malignant neoplasm of left lung: Secondary | ICD-10-CM | POA: Insufficient documentation

## 2016-10-08 DIAGNOSIS — C7801 Secondary malignant neoplasm of right lung: Secondary | ICD-10-CM | POA: Diagnosis not present

## 2016-10-08 DIAGNOSIS — Z7901 Long term (current) use of anticoagulants: Secondary | ICD-10-CM

## 2016-10-08 DIAGNOSIS — I10 Essential (primary) hypertension: Secondary | ICD-10-CM

## 2016-10-08 DIAGNOSIS — E785 Hyperlipidemia, unspecified: Secondary | ICD-10-CM

## 2016-10-08 DIAGNOSIS — Z5112 Encounter for antineoplastic immunotherapy: Secondary | ICD-10-CM | POA: Insufficient documentation

## 2016-10-08 DIAGNOSIS — I509 Heart failure, unspecified: Secondary | ICD-10-CM | POA: Insufficient documentation

## 2016-10-08 DIAGNOSIS — E279 Disorder of adrenal gland, unspecified: Secondary | ICD-10-CM | POA: Diagnosis not present

## 2016-10-08 DIAGNOSIS — Z9221 Personal history of antineoplastic chemotherapy: Secondary | ICD-10-CM | POA: Insufficient documentation

## 2016-10-08 DIAGNOSIS — E039 Hypothyroidism, unspecified: Secondary | ICD-10-CM | POA: Diagnosis not present

## 2016-10-08 DIAGNOSIS — Z7984 Long term (current) use of oral hypoglycemic drugs: Secondary | ICD-10-CM | POA: Diagnosis not present

## 2016-10-08 DIAGNOSIS — E042 Nontoxic multinodular goiter: Secondary | ICD-10-CM | POA: Diagnosis not present

## 2016-10-08 DIAGNOSIS — J449 Chronic obstructive pulmonary disease, unspecified: Secondary | ICD-10-CM | POA: Diagnosis not present

## 2016-10-08 DIAGNOSIS — C3492 Malignant neoplasm of unspecified part of left bronchus or lung: Secondary | ICD-10-CM

## 2016-10-08 DIAGNOSIS — Z87891 Personal history of nicotine dependence: Secondary | ICD-10-CM | POA: Insufficient documentation

## 2016-10-08 DIAGNOSIS — I712 Thoracic aortic aneurysm, without rupture: Secondary | ICD-10-CM | POA: Insufficient documentation

## 2016-10-08 DIAGNOSIS — Z9119 Patient's noncompliance with other medical treatment and regimen: Secondary | ICD-10-CM | POA: Insufficient documentation

## 2016-10-08 DIAGNOSIS — D649 Anemia, unspecified: Secondary | ICD-10-CM | POA: Insufficient documentation

## 2016-10-08 LAB — CBC WITH DIFFERENTIAL/PLATELET
Basophils Absolute: 0 10*3/uL (ref 0–0.1)
Basophils Relative: 0 %
EOS ABS: 0.1 10*3/uL (ref 0–0.7)
EOS PCT: 1 %
HCT: 34.9 % — ABNORMAL LOW (ref 35.0–47.0)
Hemoglobin: 11 g/dL — ABNORMAL LOW (ref 12.0–16.0)
LYMPHS ABS: 0.6 10*3/uL — AB (ref 1.0–3.6)
Lymphocytes Relative: 8 %
MCH: 26.1 pg (ref 26.0–34.0)
MCHC: 31.5 g/dL — ABNORMAL LOW (ref 32.0–36.0)
MCV: 82.9 fL (ref 80.0–100.0)
MONO ABS: 0.8 10*3/uL (ref 0.2–0.9)
MONOS PCT: 11 %
Neutro Abs: 5.8 10*3/uL (ref 1.4–6.5)
Neutrophils Relative %: 80 %
PLATELETS: 254 10*3/uL (ref 150–440)
RBC: 4.21 MIL/uL (ref 3.80–5.20)
RDW: 19.5 % — AB (ref 11.5–14.5)
WBC: 7.4 10*3/uL (ref 3.6–11.0)

## 2016-10-08 LAB — COMPREHENSIVE METABOLIC PANEL
ALT: 12 U/L — ABNORMAL LOW (ref 14–54)
ANION GAP: 8 (ref 5–15)
AST: 15 U/L (ref 15–41)
Albumin: 2.8 g/dL — ABNORMAL LOW (ref 3.5–5.0)
Alkaline Phosphatase: 53 U/L (ref 38–126)
BUN: 15 mg/dL (ref 6–20)
CHLORIDE: 100 mmol/L — AB (ref 101–111)
CO2: 30 mmol/L (ref 22–32)
Calcium: 9 mg/dL (ref 8.9–10.3)
Creatinine, Ser: 0.58 mg/dL (ref 0.44–1.00)
GFR calc non Af Amer: >60 mL/min (ref >60)
Glucose, Bld: 177 mg/dL — ABNORMAL HIGH (ref 65–99)
Potassium: 3.8 mmol/L (ref 3.5–5.1)
SODIUM: 138 mmol/L (ref 135–145)
Total Bilirubin: 0.2 mg/dL — ABNORMAL LOW (ref 0.3–1.2)
Total Protein: 7.7 g/dL (ref 6.5–8.1)

## 2016-10-08 MED ORDER — SODIUM CHLORIDE 0.9 % IV SOLN
Freq: Once | INTRAVENOUS | Status: AC
  Administered 2016-10-08: 09:00:00 via INTRAVENOUS
  Filled 2016-10-08: qty 1000

## 2016-10-08 MED ORDER — SODIUM CHLORIDE 0.9 % IV SOLN
1200.0000 mg | Freq: Once | INTRAVENOUS | Status: AC
  Administered 2016-10-08: 1200 mg via INTRAVENOUS
  Filled 2016-10-08: qty 20

## 2016-10-08 NOTE — Progress Notes (Signed)
Patient offers no complaints today. 

## 2016-10-08 NOTE — Patient Instructions (Signed)
Atezolizumab injection What is this medicine? ATEZOLIZUMAB (a te zoe LIZ ue mab) is a monoclonal antibody. It is used to treat bladder cancer (urothelial cancer) and non-small cell lung cancer. This medicine may be used for other purposes; ask your health care provider or pharmacist if you have questions. COMMON BRAND NAME(S): Tecentriq What should I tell my health care provider before I take this medicine? They need to know if you have any of these conditions: -diabetes -immune system problems -infection -inflammatory bowel disease -liver disease -lung or breathing disease -lupus -nervous system problems like myasthenia gravis or Guillain-Barre syndrome -organ transplant -an unusual or allergic reaction to atezolizumab, other medicines, foods, dyes, or preservatives -pregnant or trying to get pregnant -breast-feeding How should I use this medicine? This medicine is for infusion into a vein. It is given by a health care professional in a hospital or clinic setting. A special MedGuide will be given to you before each treatment. Be sure to read this information carefully each time. Talk to your pediatrician regarding the use of this medicine in children. Special care may be needed. Overdosage: If you think you have taken too much of this medicine contact a poison control center or emergency room at once. NOTE: This medicine is only for you. Do not share this medicine with others. What if I miss a dose? It is important not to miss your dose. Call your doctor or health care professional if you are unable to keep an appointment. What may interact with this medicine? Interactions have not been studied. This list may not describe all possible interactions. Give your health care provider a list of all the medicines, herbs, non-prescription drugs, or dietary supplements you use. Also tell them if you smoke, drink alcohol, or use illegal drugs. Some items may interact with your medicine. What  should I watch for while using this medicine? Your condition will be monitored carefully while you are receiving this medicine. You may need blood work done while you are taking this medicine. Do not become pregnant while taking this medicine or for at least 5 months after stopping it. Women should inform their doctor if they wish to become pregnant or think they might be pregnant. There is a potential for serious side effects to an unborn child. Talk to your health care professional or pharmacist for more information. Do not breast-feed an infant while taking this medicine or for at least 5 months after the last dose. What side effects may I notice from receiving this medicine? Side effects that you should report to your doctor or health care professional as soon as possible: -allergic reactions like skin rash, itching or hives, swelling of the face, lips, or tongue -black, tarry stools -bloody or watery diarrhea -breathing problems -changes in vision -chest pain or chest tightness -chills -facial flushing -fever -headache -signs and symptoms of high blood sugar such as dizziness; dry mouth; dry skin; fruity breath; nausea; stomach pain; increased hunger or thirst; increased urination -signs and symptoms of liver injury like dark yellow or brown urine; general ill feeling or flu-like symptoms; light-colored stools; loss of appetite; nausea; right upper belly pain; unusually weak or tired; yellowing of the eyes or skin -stomach pain -trouble passing urine or change in the amount of urine Side effects that usually do not require medical attention (report to your doctor or health care professional if they continue or are bothersome): -cough -diarrhea -joint pain -muscle pain -muscle weakness -tiredness -weight loss This list may not describe all   possible side effects. Call your doctor for medical advice about side effects. You may report side effects to FDA at 1-800-FDA-1088. Where should  I keep my medicine? This drug is given in a hospital or clinic and will not be stored at home. NOTE: This sheet is a summary. It may not cover all possible information. If you have questions about this medicine, talk to your doctor, pharmacist, or health care provider.  2018 Elsevier/Gold Standard (2015-06-18 17:54:14)  

## 2016-10-09 LAB — THYROID PANEL WITH TSH
FREE THYROXINE INDEX: 2.5 (ref 1.2–4.9)
T3 UPTAKE RATIO: 33 % (ref 24–39)
T4, Total: 7.5 ug/dL (ref 4.5–12.0)
TSH: 0.285 u[IU]/mL — ABNORMAL LOW (ref 0.450–4.500)

## 2016-10-27 ENCOUNTER — Inpatient Hospital Stay: Payer: Medicare Other

## 2016-10-27 NOTE — Progress Notes (Signed)
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  INTERVAL HISTORY: Patient returns to clinic today for further evaluation and consideration of cycle 2 of Tecentriq. She tolerated her first infusion well with only a mild nonproductive cough. She otherwise feels well and is asymptomatic. She has no neurologic complaints. She denies any recent fevers or illnesses. She has a good appetite and denies weight loss. She has no chest pain or shortness of breath. She denies any nausea, vomiting, constipation, or diarrhea. She has no urinary complaints. Patient offers no further specific complaints today.   REVIEW OF SYSTEMS:   Review of Systems  Constitutional: Negative.  Negative for fever, malaise/fatigue and weight loss.  Respiratory: Positive for cough. Negative for shortness of breath.   Cardiovascular: Negative.  Negative for chest pain and leg swelling.  Gastrointestinal: Negative.  Negative for abdominal pain, nausea and vomiting.  Genitourinary: Negative.   Musculoskeletal: Negative.   Skin: Negative.  Negative for rash.  Neurological: Negative.  Negative for sensory change and weakness.  Psychiatric/Behavioral: Negative.  The patient is not nervous/anxious.    As per HPI. Otherwise, a complete review of systems is negative.   PAST MEDICAL HISTORY: Past Medical History:  Diagnosis Date  . Atrial fibrillation ([place])   . CHF (congestive heart failure) ([place])   . COPD (chronic obstructive pulmonary disease) ([place])   . Diabetes mellitus without complication ([place])   . Hyperlipidemia   . Hypertension   . Hypothyroidism   . Lung cancer [facility]) 2015   Dr Merleen Nicely [facility], chemo and rad  . Personal history of  chemotherapy   . Personal history of radiation therapy   . Thyroid disease     PAST SURGICAL HISTORY: Past Surgical History:  Procedure Laterality Date  . COLONOSCOPY  2006   Dr Raynelle Fanning patient ?polyps  . COLONOSCOPY WITH PROPOFOL N/A 01/30/2015   Procedure: COLONOSCOPY WITH PROPOFOL;  Surgeon: Manya Silvas, MD;  Location: [facility] ENDOSCOPY;  Service: Endoscopy;  Laterality: N/A;  . ESOPHAGOGASTRODUODENOSCOPY (EGD) WITH PROPOFOL N/A 01/30/2015   Procedure: ESOPHAGOGASTRODUODENOSCOPY (EGD) WITH PROPOFOL;  Surgeon: Manya Silvas, MD;  Location: [facility] ENDOSCOPY;  Service: Endoscopy;  Laterality: N/A;  . JOINT REPLACEMENT    . Left knee surgery Left   . LUNG BIOPSY      FAMILY HISTORY Family History  Problem Relation Age of Onset  . Heart attack Mother   . Lung cancer Father   . Heart attack Son 56  . Breast cancer Neg Hx        ADVANCED DIRECTIVES:    HEALTH MAINTENANCE: Social History  Substance Use Topics  . Smoking status: Former Smoker    Quit date: 10/29/2013  . Smokeless tobacco: Never Used  . Alcohol use No     Colonoscopy:  PAP:  Bone density:  Lipid panel:  Allergies  Allergen Reactions  . Aleve [Naproxen Sodium]     Pt states can tolerate some, but causes GI upset  . Celebrex [Celecoxib] Other (See Comments)    Pt states can handle some, but causes GI upset    Current Outpatient Prescriptions  Medication Sig Dispense Refill  . atorvastatin (LIPITOR) 10 MG tablet Take 10 mg by mouth daily at 6 PM.     .  carvedilol (COREG) 6.25 MG tablet Take 6.25 mg by mouth 2 (two) times daily with a meal.     . Cholecalciferol (VITAMIN D3) 2000 units capsule Take 2,000 Units by mouth daily.     Marland Kitchen dexamethasone (DECADRON) 4 MG tablet Take 8 mg by mouth daily. Take for 2 days after [place] chemotherapy, then as directed.    . diclofenac sodium (VOLTAREN) 1 % GEL Apply topically.    . empagliflozin (JARDIANCE) 10 MG TABS tablet Take 10 mg by mouth daily.    .  furosemide (LASIX) 40 MG tablet Take 40 mg by mouth daily.     Marland Kitchen levothyroxine (SYNTHROID, LEVOTHROID) 50 MCG tablet Take 25 mcg by mouth daily.     Marland Kitchen losartan (COZAAR) 50 MG tablet Take 50 mg by mouth daily.     . Magnesium 250 MG TABS Take 1 tablet by mouth daily.     . metFORMIN (GLUCOPHAGE-XR) 500 MG 24 hr tablet Take 1,000 mg by mouth daily with breakfast.     . pantoprazole (PROTONIX) 40 MG tablet Take 40 mg by mouth daily.     . prochlorperazine (COMPAZINE) 10 MG tablet Take 10 mg by mouth every 6 (six) hours as needed.     . traMADol (ULTRAM) 50 MG tablet Take by mouth.    . warfarin (COUMADIN) 4 MG tablet Take 6 mg by mouth one time only at 6 PM.      No current facility-administered medications for this visit.    Facility-Administered Medications Ordered in Other Visits  Medication Dose Route Frequency Provider Last Rate Last Dose  . atezolizumab (TECENTRIQ) 1,200 mg in sodium chloride 0.9 % 250 mL chemo infusion  1,200 mg Intravenous Once Lloyd Huger, MD        OBJECTIVE: Vitals:   10/29/16 0915  BP: (!) 173/82  Pulse: 73  Resp: 18  Temp: (!) 96.5 F (35.8 C)     Body mass index is 33.55 kg/m.    ECOG FS:0 - Asymptomatic  General: Well-developed, well-nourished, no acute distress. Eyes: Pink conjunctiva, anicteric sclera. Lungs: Clear to auscultation bilaterally. Heart: Regular rate and rhythm. No rubs, murmurs, or gallops. Abdomen: Soft, nontender, nondistended. No organomegaly noted, normoactive bowel sounds. Musculoskeletal: No edema, cyanosis, or clubbing. Neuro: Alert, answering all questions appropriately. Cranial nerves grossly intact. Skin: No rashes or petechiae noted. Psych: Normal affect.   LAB RESULTS:  Lab Results  Component Value Date   NA 138 10/28/2016   K 3.8 10/28/2016   CL 100 (L) 10/28/2016   CO2 30 10/28/2016   GLUCOSE 199 (H) 10/28/2016   BUN 16 10/28/2016   CREATININE 0.58 10/28/2016   CALCIUM 9.3 10/28/2016   PROT 7.7  10/28/2016   ALBUMIN 2.8 (L) 10/28/2016   AST 16 10/28/2016   ALT 14 10/28/2016   ALKPHOS 53 10/28/2016   BILITOT 0.4 10/28/2016   GFRNONAA >60 10/28/2016   GFRAA >60 10/28/2016    Lab Results  Component Value Date   WBC 8.2 10/28/2016   NEUTROABS 6.4 10/28/2016   HGB 11.0 (L) 10/28/2016   HCT 34.7 (L) 10/28/2016   MCV 80.4 10/28/2016   PLT 247 10/28/2016    STUDIES: No results found.  ONCOLOGIC HISTORY:   Patient was initially diagnosed with a 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 PET scan on August 25, 2012 revealed a hypermetabolic nodule 2.1 cm in the left apices. Bronchoscopy on August 31, 2012 did not show any evidence of malignancy, but CT-guided biopsy  on September 18, 2012 confirmed squamous cell carcinoma. Patient was determined not to be a surgical candidate at that time and underwent SBRT in May 2014. CT scan on April 23, 2013 revealed an enlarging prevascular lymph node. PET scan on June 04, 2013 revealed a new enlarging left lobe paratracheal lymph node with no other evidence of metastatic disease. Subsequent EBUS on June 13, 2013 confirmed recurrent squamous cell carcinoma. Patient underwent XRT along with concurrent navelbine between July 16, 2013 - August 28, 2013. Patient was then disease-free until CT scan on Sep 30, 2015 revealed an interval increase of a nodule measuring up to 2.3 cm in the left lower lobe. PET scan on Oct 12, 2945 was hypermetabolic in the left upper, left lower and right middle lobe nodules concerning for neoplasm. Patient completed 4 cycles of dose reduced carboplatinum and gemcitabine between July 2017 and September 2017. Patient initiated [place] on Oct 08, 2016.  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  PLAN:    1. 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 CT scan results from  September 15, 2016 reviewed independently with clear progression of disease. After lengthy discussion with the patient, she has agreed to reinitiate treatment with immunotherapy. Patient continues to refuse port placement. Will give Tecentriq every 3 weeks and re-image after 4-6 cycles. Proceed with cycle 2 of Tecentriq today. Return to clinic in 3 weeks for consideration of cycle 3.  2. Hypertension: Patient's blood pressure continues to be persistently elevated. Continue current medications. Patient admits noncompliance to taking her blood pressure medications.  3. Hypokalemia: Resolved. Patient was previously given dietary recommendations.  4. Anemia: Mild, monitor. 5. Aortic aneurysm: Previously evaluated in the past at [facility]. CT scan results indicate stability of her aneurysm. Patient reports surgery was recommended previously, but she declined. She indicated she will also will likely decline any interventions at this time again. Previously a referral was made, back to her cardiothoracic surgeon, but patient canceled the appointment. 6. Cough: Have recommended OTC treatments. Consider chest x-ray in the future if symptoms do not resolve.   Patient expressed understanding and was in agreement with this plan. She also understands that She can call clinic at any time with any questions, concerns, or complaints.    Lloyd Huger, MD   10/29/2016 9:33 AM

## 2016-10-28 ENCOUNTER — Inpatient Hospital Stay: Payer: Medicare Other

## 2016-10-28 DIAGNOSIS — C3492 Malignant neoplasm of unspecified part of left bronchus or lung: Secondary | ICD-10-CM

## 2016-10-28 LAB — CBC WITH DIFFERENTIAL/PLATELET
BASOS PCT: 0 %
Basophils Absolute: 0 10*3/uL (ref 0–0.1)
Eosinophils Absolute: 0.2 10*3/uL (ref 0–0.7)
Eosinophils Relative: 2 %
HCT: 34.7 % — ABNORMAL LOW (ref 35.0–47.0)
HEMOGLOBIN: 11 g/dL — AB (ref 12.0–16.0)
LYMPHS ABS: 0.7 10*3/uL — AB (ref 1.0–3.6)
LYMPHS PCT: 8 %
MCH: 25.5 pg — AB (ref 26.0–34.0)
MCHC: 31.7 g/dL — AB (ref 32.0–36.0)
MCV: 80.4 fL (ref 80.0–100.0)
MONO ABS: 1 10*3/uL — AB (ref 0.2–0.9)
MONOS PCT: 12 %
NEUTROS ABS: 6.4 10*3/uL (ref 1.4–6.5)
NEUTROS PCT: 78 %
Platelets: 247 10*3/uL (ref 150–440)
RBC: 4.31 MIL/uL (ref 3.80–5.20)
RDW: 20 % — AB (ref 11.5–14.5)
WBC: 8.2 10*3/uL (ref 3.6–11.0)

## 2016-10-28 LAB — COMPREHENSIVE METABOLIC PANEL
ALBUMIN: 2.8 g/dL — AB (ref 3.5–5.0)
ALK PHOS: 53 U/L (ref 38–126)
ALT: 14 U/L (ref 14–54)
ANION GAP: 8 (ref 5–15)
AST: 16 U/L (ref 15–41)
BILIRUBIN TOTAL: 0.4 mg/dL (ref 0.3–1.2)
BUN: 16 mg/dL (ref 6–20)
CALCIUM: 9.3 mg/dL (ref 8.9–10.3)
CO2: 30 mmol/L (ref 22–32)
Chloride: 100 mmol/L — ABNORMAL LOW (ref 101–111)
Creatinine, Ser: 0.58 mg/dL (ref 0.44–1.00)
GFR calc Af Amer: >60 mL/min (ref >60)
GLUCOSE: 199 mg/dL — AB (ref 65–99)
POTASSIUM: 3.8 mmol/L (ref 3.5–5.1)
Sodium: 138 mmol/L (ref 135–145)
Total Protein: 7.7 g/dL (ref 6.5–8.1)

## 2016-10-28 MED ORDER — SODIUM CHLORIDE 0.9 % IV SOLN
1200.0000 mg | Freq: Once | INTRAVENOUS | Status: AC
  Administered 2016-10-29: 1200 mg via INTRAVENOUS
  Filled 2016-10-28: qty 20

## 2016-10-29 ENCOUNTER — Inpatient Hospital Stay: Payer: Medicare Other

## 2016-10-29 ENCOUNTER — Inpatient Hospital Stay: Payer: Medicare Other | Attending: Oncology | Admitting: Oncology

## 2016-10-29 VITALS — BP 138/79 | HR 67 | Resp 18

## 2016-10-29 VITALS — BP 173/82 | HR 73 | Temp 96.5°F | Resp 18 | Wt 189.4 lb

## 2016-10-29 DIAGNOSIS — Z87891 Personal history of nicotine dependence: Secondary | ICD-10-CM | POA: Diagnosis not present

## 2016-10-29 DIAGNOSIS — Z79899 Other long term (current) drug therapy: Secondary | ICD-10-CM | POA: Diagnosis not present

## 2016-10-29 DIAGNOSIS — E119 Type 2 diabetes mellitus without complications: Secondary | ICD-10-CM | POA: Insufficient documentation

## 2016-10-29 DIAGNOSIS — C7802 Secondary malignant neoplasm of left lung: Secondary | ICD-10-CM | POA: Diagnosis not present

## 2016-10-29 DIAGNOSIS — D649 Anemia, unspecified: Secondary | ICD-10-CM | POA: Diagnosis not present

## 2016-10-29 DIAGNOSIS — Z801 Family history of malignant neoplasm of trachea, bronchus and lung: Secondary | ICD-10-CM | POA: Insufficient documentation

## 2016-10-29 DIAGNOSIS — C7801 Secondary malignant neoplasm of right lung: Secondary | ICD-10-CM | POA: Diagnosis not present

## 2016-10-29 DIAGNOSIS — I4891 Unspecified atrial fibrillation: Secondary | ICD-10-CM | POA: Insufficient documentation

## 2016-10-29 DIAGNOSIS — E785 Hyperlipidemia, unspecified: Secondary | ICD-10-CM | POA: Diagnosis not present

## 2016-10-29 DIAGNOSIS — C3492 Malignant neoplasm of unspecified part of left bronchus or lung: Secondary | ICD-10-CM

## 2016-10-29 DIAGNOSIS — J449 Chronic obstructive pulmonary disease, unspecified: Secondary | ICD-10-CM | POA: Insufficient documentation

## 2016-10-29 DIAGNOSIS — Z7901 Long term (current) use of anticoagulants: Secondary | ICD-10-CM | POA: Insufficient documentation

## 2016-10-29 DIAGNOSIS — Z9119 Patient's noncompliance with other medical treatment and regimen: Secondary | ICD-10-CM | POA: Diagnosis not present

## 2016-10-29 DIAGNOSIS — I10 Essential (primary) hypertension: Secondary | ICD-10-CM | POA: Insufficient documentation

## 2016-10-29 DIAGNOSIS — Z9221 Personal history of antineoplastic chemotherapy: Secondary | ICD-10-CM | POA: Insufficient documentation

## 2016-10-29 DIAGNOSIS — Z5112 Encounter for antineoplastic immunotherapy: Secondary | ICD-10-CM | POA: Insufficient documentation

## 2016-10-29 DIAGNOSIS — Z7984 Long term (current) use of oral hypoglycemic drugs: Secondary | ICD-10-CM

## 2016-10-29 DIAGNOSIS — I719 Aortic aneurysm of unspecified site, without rupture: Secondary | ICD-10-CM | POA: Insufficient documentation

## 2016-10-29 DIAGNOSIS — E039 Hypothyroidism, unspecified: Secondary | ICD-10-CM | POA: Insufficient documentation

## 2016-10-29 DIAGNOSIS — C3412 Malignant neoplasm of upper lobe, left bronchus or lung: Secondary | ICD-10-CM | POA: Diagnosis not present

## 2016-10-29 DIAGNOSIS — Z923 Personal history of irradiation: Secondary | ICD-10-CM | POA: Diagnosis not present

## 2016-10-29 DIAGNOSIS — R05 Cough: Secondary | ICD-10-CM | POA: Insufficient documentation

## 2016-10-29 DIAGNOSIS — I509 Heart failure, unspecified: Secondary | ICD-10-CM

## 2016-10-29 MED ORDER — SODIUM CHLORIDE 0.9 % IV SOLN
Freq: Once | INTRAVENOUS | Status: AC
  Administered 2016-10-29: 10:00:00 via INTRAVENOUS
  Filled 2016-10-29: qty 1000

## 2016-10-29 NOTE — Progress Notes (Signed)
Here for follow up. Overall doing well per pt

## 2016-10-29 NOTE — Patient Instructions (Signed)
Atezolizumab injection What is this medicine? ATEZOLIZUMAB (a te zoe LIZ ue mab) is a monoclonal antibody. It is used to treat bladder cancer (urothelial cancer) and non-small cell lung cancer. This medicine may be used for other purposes; ask your health care provider or pharmacist if you have questions. COMMON BRAND NAME(S): Tecentriq What should I tell my health care provider before I take this medicine? They need to know if you have any of these conditions: -diabetes -immune system problems -infection -inflammatory bowel disease -liver disease -lung or breathing disease -lupus -nervous system problems like myasthenia gravis or Guillain-Barre syndrome -organ transplant -an unusual or allergic reaction to atezolizumab, other medicines, foods, dyes, or preservatives -pregnant or trying to get pregnant -breast-feeding How should I use this medicine? This medicine is for infusion into a vein. It is given by a health care professional in a hospital or clinic setting. A special MedGuide will be given to you before each treatment. Be sure to read this information carefully each time. Talk to your pediatrician regarding the use of this medicine in children. Special care may be needed. Overdosage: If you think you have taken too much of this medicine contact a poison control center or emergency room at once. NOTE: This medicine is only for you. Do not share this medicine with others. What if I miss a dose? It is important not to miss your dose. Call your doctor or health care professional if you are unable to keep an appointment. What may interact with this medicine? Interactions have not been studied. This list may not describe all possible interactions. Give your health care provider a list of all the medicines, herbs, non-prescription drugs, or dietary supplements you use. Also tell them if you smoke, drink alcohol, or use illegal drugs. Some items may interact with your medicine. What  should I watch for while using this medicine? Your condition will be monitored carefully while you are receiving this medicine. You may need blood work done while you are taking this medicine. Do not become pregnant while taking this medicine or for at least 5 months after stopping it. Women should inform their doctor if they wish to become pregnant or think they might be pregnant. There is a potential for serious side effects to an unborn child. Talk to your health care professional or pharmacist for more information. Do not breast-feed an infant while taking this medicine or for at least 5 months after the last dose. What side effects may I notice from receiving this medicine? Side effects that you should report to your doctor or health care professional as soon as possible: -allergic reactions like skin rash, itching or hives, swelling of the face, lips, or tongue -black, tarry stools -bloody or watery diarrhea -breathing problems -changes in vision -chest pain or chest tightness -chills -facial flushing -fever -headache -signs and symptoms of high blood sugar such as dizziness; dry mouth; dry skin; fruity breath; nausea; stomach pain; increased hunger or thirst; increased urination -signs and symptoms of liver injury like dark yellow or brown urine; general ill feeling or flu-like symptoms; light-colored stools; loss of appetite; nausea; right upper belly pain; unusually weak or tired; yellowing of the eyes or skin -stomach pain -trouble passing urine or change in the amount of urine Side effects that usually do not require medical attention (report to your doctor or health care professional if they continue or are bothersome): -cough -diarrhea -joint pain -muscle pain -muscle weakness -tiredness -weight loss This list may not describe all   possible side effects. Call your doctor for medical advice about side effects. You may report side effects to FDA at 1-800-FDA-1088. Where should  I keep my medicine? This drug is given in a hospital or clinic and will not be stored at home. NOTE: This sheet is a summary. It may not cover all possible information. If you have questions about this medicine, talk to your doctor, pharmacist, or health care provider.  2018 Elsevier/Gold Standard (2015-06-18 17:54:14)  

## 2016-11-18 ENCOUNTER — Other Ambulatory Visit: Payer: Self-pay | Admitting: *Deleted

## 2016-11-18 ENCOUNTER — Inpatient Hospital Stay: Payer: Medicare Other

## 2016-11-18 DIAGNOSIS — C3412 Malignant neoplasm of upper lobe, left bronchus or lung: Secondary | ICD-10-CM | POA: Diagnosis not present

## 2016-11-18 DIAGNOSIS — C3492 Malignant neoplasm of unspecified part of left bronchus or lung: Secondary | ICD-10-CM

## 2016-11-18 LAB — CBC WITH DIFFERENTIAL/PLATELET
BASOS ABS: 0 10*3/uL (ref 0–0.1)
BASOS PCT: 1 %
Eosinophils Absolute: 0.1 10*3/uL (ref 0–0.7)
Eosinophils Relative: 1 %
HEMATOCRIT: 33.7 % — AB (ref 35.0–47.0)
HEMOGLOBIN: 10.6 g/dL — AB (ref 12.0–16.0)
LYMPHS PCT: 9 %
Lymphs Abs: 0.7 10*3/uL — ABNORMAL LOW (ref 1.0–3.6)
MCH: 25.2 pg — ABNORMAL LOW (ref 26.0–34.0)
MCHC: 31.5 g/dL — ABNORMAL LOW (ref 32.0–36.0)
MCV: 79.9 fL — ABNORMAL LOW (ref 80.0–100.0)
MONOS PCT: 12 %
Monocytes Absolute: 0.9 10*3/uL (ref 0.2–0.9)
NEUTROS ABS: 5.9 10*3/uL (ref 1.4–6.5)
NEUTROS PCT: 77 %
Platelets: 278 10*3/uL (ref 150–440)
RBC: 4.22 MIL/uL (ref 3.80–5.20)
RDW: 21.1 % — ABNORMAL HIGH (ref 11.5–14.5)
WBC: 7.6 10*3/uL (ref 3.6–11.0)

## 2016-11-18 LAB — COMPREHENSIVE METABOLIC PANEL
ALBUMIN: 2.8 g/dL — AB (ref 3.5–5.0)
ALK PHOS: 50 U/L (ref 38–126)
ALT: 12 U/L — ABNORMAL LOW (ref 14–54)
AST: 16 U/L (ref 15–41)
Anion gap: 10 (ref 5–15)
BILIRUBIN TOTAL: 0.7 mg/dL (ref 0.3–1.2)
BUN: 16 mg/dL (ref 6–20)
CALCIUM: 9.2 mg/dL (ref 8.9–10.3)
CO2: 32 mmol/L (ref 22–32)
CREATININE: 0.6 mg/dL (ref 0.44–1.00)
Chloride: 96 mmol/L — ABNORMAL LOW (ref 101–111)
GFR calc Af Amer: >60 mL/min (ref >60)
GLUCOSE: 188 mg/dL — AB (ref 65–99)
Potassium: 3.6 mmol/L (ref 3.5–5.1)
Sodium: 138 mmol/L (ref 135–145)
TOTAL PROTEIN: 7.6 g/dL (ref 6.5–8.1)

## 2016-11-18 MED ORDER — SODIUM CHLORIDE 0.9 % IV SOLN
Freq: Once | INTRAVENOUS | Status: AC
  Administered 2016-11-19: 10:00:00 via INTRAVENOUS
  Filled 2016-11-18: qty 1000

## 2016-11-18 MED ORDER — SODIUM CHLORIDE 0.9 % IV SOLN
1200.0000 mg | Freq: Once | INTRAVENOUS | Status: AC
  Administered 2016-11-19: 1200 mg via INTRAVENOUS
  Filled 2016-11-18: qty 20

## 2016-11-18 NOTE — Progress Notes (Signed)
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  INTERVAL HISTORY: Patient returns to clinic today for further evaluation and consideration of cycle 3 of Tecentriq. She is tolerating her infusions well with only a mild nonproductive cough. She otherwise feels well and is asymptomatic. She has no neurologic complaints. She denies any recent fevers or illnesses. She has a good appetite and denies weight loss. She has no chest pain or shortness of breath. She denies any nausea, vomiting, constipation, or diarrhea. She has no urinary complaints. Patient offers no further specific complaints today.   REVIEW OF SYSTEMS:   Review of Systems  Constitutional: Negative.  Negative for fever, malaise/fatigue and weight loss.  Respiratory: Positive for cough. Negative for shortness of breath.   Cardiovascular: Negative.  Negative for chest pain and leg swelling.  Gastrointestinal: Negative.  Negative for abdominal pain, nausea and vomiting.  Genitourinary: Negative.   Musculoskeletal: Negative.   Skin: Negative.  Negative for rash.  Neurological: Negative.  Negative for sensory change and weakness.  Psychiatric/Behavioral: Negative.  The patient is not nervous/anxious.    As per HPI. Otherwise, a complete review of systems is negative.   PAST MEDICAL HISTORY: Past Medical History:  Diagnosis Date  . Atrial fibrillation ([place])   . CHF (congestive heart failure) ([place])   . COPD (chronic obstructive pulmonary disease) ([place])   . Diabetes mellitus without complication ([place])   . Hyperlipidemia   . Hypertension   . Hypothyroidism   . Lung cancer [facility]) 2015   Dr Merleen Nicely [facility], chemo and rad  . Personal history of  chemotherapy   . Personal history of radiation therapy   . Thyroid disease     PAST SURGICAL HISTORY: Past Surgical History:  Procedure Laterality Date  . COLONOSCOPY  2006   Dr Raynelle Fanning patient ?polyps  . COLONOSCOPY WITH PROPOFOL N/A 01/30/2015   Procedure: COLONOSCOPY WITH PROPOFOL;  Surgeon: Manya Silvas, MD;  Location: [facility] ENDOSCOPY;  Service: Endoscopy;  Laterality: N/A;  . ESOPHAGOGASTRODUODENOSCOPY (EGD) WITH PROPOFOL N/A 01/30/2015   Procedure: ESOPHAGOGASTRODUODENOSCOPY (EGD) WITH PROPOFOL;  Surgeon: Manya Silvas, MD;  Location: [facility] ENDOSCOPY;  Service: Endoscopy;  Laterality: N/A;  . JOINT REPLACEMENT    . Left knee surgery Left   . LUNG BIOPSY      FAMILY HISTORY Family History  Problem Relation Age of Onset  . Heart attack Mother   . Lung cancer Father   . Heart attack Son 53  . Breast cancer Neg Hx        ADVANCED DIRECTIVES:    HEALTH MAINTENANCE: Social History  Substance Use Topics  . Smoking status: Former Smoker    Quit date: 10/29/2013  . Smokeless tobacco: Never Used  . Alcohol use No     Colonoscopy:  PAP:  Bone density:  Lipid panel:  Allergies  Allergen Reactions  . Aleve [Naproxen Sodium]     Pt states can tolerate some, but causes GI upset  . Celebrex [Celecoxib] Other (See Comments)    Pt states can handle some, but causes GI upset    Current Outpatient Prescriptions  Medication Sig Dispense Refill  . atorvastatin (LIPITOR) 10 MG tablet Take 10 mg by mouth daily at 6 PM.     .  carvedilol (COREG) 6.25 MG tablet Take 6.25 mg by mouth 2 (two) times daily with a meal.     . Cholecalciferol (VITAMIN D3) 2000 units capsule Take 2,000 Units by mouth daily.     Marland Kitchen dexamethasone (DECADRON) 4 MG tablet Take 8 mg by mouth daily. Take for 2 days after [place] chemotherapy, then as directed.    . diclofenac sodium (VOLTAREN) 1 % GEL Apply topically.    . empagliflozin (JARDIANCE) 10 MG TABS tablet Take 10 mg by mouth daily.    .  furosemide (LASIX) 40 MG tablet Take 40 mg by mouth daily.     Marland Kitchen levothyroxine (SYNTHROID, LEVOTHROID) 50 MCG tablet Take 25 mcg by mouth daily.     Marland Kitchen losartan (COZAAR) 50 MG tablet Take 50 mg by mouth daily.     . Magnesium 250 MG TABS Take 1 tablet by mouth daily.     . metFORMIN (GLUCOPHAGE-XR) 500 MG 24 hr tablet Take 1,000 mg by mouth daily with breakfast.     . pantoprazole (PROTONIX) 40 MG tablet Take 40 mg by mouth daily.     . prochlorperazine (COMPAZINE) 10 MG tablet Take 10 mg by mouth every 6 (six) hours as needed.     . traMADol (ULTRAM) 50 MG tablet Take by mouth.    . warfarin (COUMADIN) 4 MG tablet Take 6 mg by mouth one time only at 6 PM.      No current facility-administered medications for this visit.     OBJECTIVE: Vitals:   11/19/16 1006  BP: (!) 149/74  Pulse: 60  Resp: 18  Temp: 97.9 F (36.6 C)     Body mass index is 33.14 kg/m.    ECOG FS:0 - Asymptomatic  General: Well-developed, well-nourished, no acute distress. Eyes: Pink conjunctiva, anicteric sclera. Lungs: Clear to auscultation bilaterally. Heart: Regular rate and rhythm. No rubs, murmurs, or gallops. Abdomen: Soft, nontender, nondistended. No organomegaly noted, normoactive bowel sounds. Musculoskeletal: No edema, cyanosis, or clubbing. Neuro: Alert, answering all questions appropriately. Cranial nerves grossly intact. Skin: No rashes or petechiae noted. Psych: Normal affect.   LAB RESULTS:  Lab Results  Component Value Date   NA 138 11/18/2016   K 3.6 11/18/2016   CL 96 (L) 11/18/2016   CO2 32 11/18/2016   GLUCOSE 188 (H) 11/18/2016   BUN 16 11/18/2016   CREATININE 0.60 11/18/2016   CALCIUM 9.2 11/18/2016   PROT 7.6 11/18/2016   ALBUMIN 2.8 (L) 11/18/2016   AST 16 11/18/2016   ALT 12 (L) 11/18/2016   ALKPHOS 50 11/18/2016   BILITOT 0.7 11/18/2016   GFRNONAA >60 11/18/2016   GFRAA >60 11/18/2016    Lab Results  Component Value Date   WBC 7.6 11/18/2016   NEUTROABS 5.9  11/18/2016   HGB 10.6 (L) 11/18/2016   HCT 33.7 (L) 11/18/2016   MCV 79.9 (L) 11/18/2016   PLT 278 11/18/2016    STUDIES: No results found.  ONCOLOGIC HISTORY:   Patient was initially diagnosed with a 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 PET scan on August 25, 2012 revealed a hypermetabolic nodule 2.1 cm in the left apices. Bronchoscopy on August 31, 2012 did not show any evidence of malignancy, but CT-guided biopsy on September 18, 2012 confirmed squamous cell carcinoma. Patient was determined not to be a surgical candidate at that time and underwent SBRT in May 2014. CT scan on April 23, 2013 revealed an enlarging prevascular lymph node. PET scan on June 04, 2013 revealed  a new enlarging left lobe paratracheal lymph node with no other evidence of metastatic disease. Subsequent EBUS on June 13, 2013 confirmed recurrent squamous cell carcinoma. Patient underwent XRT along with concurrent navelbine between July 16, 2013 - August 28, 2013. Patient was then disease-free until CT scan on Sep 30, 2015 revealed an interval increase of a nodule measuring up to 2.3 cm in the left lower lobe. PET scan on Oct 12, 6220 was hypermetabolic in the left upper, left lower and right middle lobe nodules concerning for neoplasm. Patient completed 4 cycles of dose reduced carboplatinum and gemcitabine between July 2017 and September 2017. Patient initiated [place] on Oct 08, 2016.  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  PLAN:    1. 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 CT scan results from September 15, 2016 reviewed independently with clear progression of disease. After lengthy discussion with the patient, she has agreed to reinitiate treatment with immunotherapy. Patient continues to refuse port placement. Will give Tecentriq every 3 weeks and  re-image after ~6 cycles. Proceed with cycle 3 of Tecentriq today. Return to clinic in 3 weeks for consideration of cycle 4.  2. Hypertension: Patient's blood pressure continues to be persistently elevated. Continue current medications. Patient admits noncompliance to taking her blood pressure medications.  3. Hypokalemia: Resolved. Patient was previously given dietary recommendations.  4. Anemia: Mild, monitor. 5. Aortic aneurysm: Previously evaluated in the past at [facility]. CT scan results indicate stability of her aneurysm. Patient reports surgery was recommended previously, but she declined. She indicated she will also will likely decline any interventions at this time again. Previously a referral was made, back to her cardiothoracic surgeon, but patient canceled the appointment. 6. Cough: Have recommended OTC treatments. Consider chest x-ray in the future.   Patient expressed understanding and was in agreement with this plan. She also understands that She can call clinic at any time with any questions, concerns, or complaints.    Lloyd Huger, MD   11/19/2016 10:58 AM

## 2016-11-19 ENCOUNTER — Inpatient Hospital Stay: Payer: Medicare Other

## 2016-11-19 ENCOUNTER — Inpatient Hospital Stay (HOSPITAL_BASED_OUTPATIENT_CLINIC_OR_DEPARTMENT_OTHER): Payer: Medicare Other | Admitting: Oncology

## 2016-11-19 VITALS — BP 149/74 | HR 60 | Temp 97.9°F | Resp 18 | Wt 187.1 lb

## 2016-11-19 VITALS — BP 104/56 | HR 59 | Resp 18

## 2016-11-19 DIAGNOSIS — C7802 Secondary malignant neoplasm of left lung: Secondary | ICD-10-CM

## 2016-11-19 DIAGNOSIS — I10 Essential (primary) hypertension: Secondary | ICD-10-CM

## 2016-11-19 DIAGNOSIS — C3412 Malignant neoplasm of upper lobe, left bronchus or lung: Secondary | ICD-10-CM

## 2016-11-19 DIAGNOSIS — I4891 Unspecified atrial fibrillation: Secondary | ICD-10-CM | POA: Diagnosis not present

## 2016-11-19 DIAGNOSIS — Z923 Personal history of irradiation: Secondary | ICD-10-CM

## 2016-11-19 DIAGNOSIS — Z9221 Personal history of antineoplastic chemotherapy: Secondary | ICD-10-CM

## 2016-11-19 DIAGNOSIS — Z9119 Patient's noncompliance with other medical treatment and regimen: Secondary | ICD-10-CM

## 2016-11-19 DIAGNOSIS — C7801 Secondary malignant neoplasm of right lung: Secondary | ICD-10-CM | POA: Diagnosis not present

## 2016-11-19 DIAGNOSIS — C3492 Malignant neoplasm of unspecified part of left bronchus or lung: Secondary | ICD-10-CM

## 2016-11-19 DIAGNOSIS — J449 Chronic obstructive pulmonary disease, unspecified: Secondary | ICD-10-CM

## 2016-11-19 DIAGNOSIS — I719 Aortic aneurysm of unspecified site, without rupture: Secondary | ICD-10-CM

## 2016-11-19 DIAGNOSIS — E119 Type 2 diabetes mellitus without complications: Secondary | ICD-10-CM

## 2016-11-19 DIAGNOSIS — Z7901 Long term (current) use of anticoagulants: Secondary | ICD-10-CM

## 2016-11-19 DIAGNOSIS — I509 Heart failure, unspecified: Secondary | ICD-10-CM | POA: Diagnosis not present

## 2016-11-19 DIAGNOSIS — Z801 Family history of malignant neoplasm of trachea, bronchus and lung: Secondary | ICD-10-CM

## 2016-11-19 DIAGNOSIS — D649 Anemia, unspecified: Secondary | ICD-10-CM | POA: Diagnosis not present

## 2016-11-19 DIAGNOSIS — Z79899 Other long term (current) drug therapy: Secondary | ICD-10-CM | POA: Diagnosis not present

## 2016-11-19 DIAGNOSIS — Z7984 Long term (current) use of oral hypoglycemic drugs: Secondary | ICD-10-CM

## 2016-11-19 DIAGNOSIS — Z87891 Personal history of nicotine dependence: Secondary | ICD-10-CM

## 2016-11-19 DIAGNOSIS — R05 Cough: Secondary | ICD-10-CM | POA: Diagnosis not present

## 2016-11-19 DIAGNOSIS — E785 Hyperlipidemia, unspecified: Secondary | ICD-10-CM

## 2016-11-19 DIAGNOSIS — E039 Hypothyroidism, unspecified: Secondary | ICD-10-CM

## 2016-11-19 LAB — THYROID PANEL WITH TSH
Free Thyroxine Index: 2.4 (ref 1.2–4.9)
T3 UPTAKE RATIO: 33 % (ref 24–39)
T4, Total: 7.2 ug/dL (ref 4.5–12.0)
TSH: 0.513 u[IU]/mL (ref 0.450–4.500)

## 2016-11-19 NOTE — Patient Instructions (Signed)
Atezolizumab injection What is this medicine? ATEZOLIZUMAB (a te zoe LIZ ue mab) is a monoclonal antibody. It is used to treat bladder cancer (urothelial cancer) and non-small cell lung cancer. This medicine may be used for other purposes; ask your health care provider or pharmacist if you have questions. COMMON BRAND NAME(S): Tecentriq What should I tell my health care provider before I take this medicine? They need to know if you have any of these conditions: -diabetes -immune system problems -infection -inflammatory bowel disease -liver disease -lung or breathing disease -lupus -nervous system problems like myasthenia gravis or Guillain-Barre syndrome -organ transplant -an unusual or allergic reaction to atezolizumab, other medicines, foods, dyes, or preservatives -pregnant or trying to get pregnant -breast-feeding How should I use this medicine? This medicine is for infusion into a vein. It is given by a health care professional in a hospital or clinic setting. A special MedGuide will be given to you before each treatment. Be sure to read this information carefully each time. Talk to your pediatrician regarding the use of this medicine in children. Special care may be needed. Overdosage: If you think you have taken too much of this medicine contact a poison control center or emergency room at once. NOTE: This medicine is only for you. Do not share this medicine with others. What if I miss a dose? It is important not to miss your dose. Call your doctor or health care professional if you are unable to keep an appointment. What may interact with this medicine? Interactions have not been studied. This list may not describe all possible interactions. Give your health care provider a list of all the medicines, herbs, non-prescription drugs, or dietary supplements you use. Also tell them if you smoke, drink alcohol, or use illegal drugs. Some items may interact with your medicine. What  should I watch for while using this medicine? Your condition will be monitored carefully while you are receiving this medicine. You may need blood work done while you are taking this medicine. Do not become pregnant while taking this medicine or for at least 5 months after stopping it. Women should inform their doctor if they wish to become pregnant or think they might be pregnant. There is a potential for serious side effects to an unborn child. Talk to your health care professional or pharmacist for more information. Do not breast-feed an infant while taking this medicine or for at least 5 months after the last dose. What side effects may I notice from receiving this medicine? Side effects that you should report to your doctor or health care professional as soon as possible: -allergic reactions like skin rash, itching or hives, swelling of the face, lips, or tongue -black, tarry stools -bloody or watery diarrhea -breathing problems -changes in vision -chest pain or chest tightness -chills -facial flushing -fever -headache -signs and symptoms of high blood sugar such as dizziness; dry mouth; dry skin; fruity breath; nausea; stomach pain; increased hunger or thirst; increased urination -signs and symptoms of liver injury like dark yellow or brown urine; general ill feeling or flu-like symptoms; light-colored stools; loss of appetite; nausea; right upper belly pain; unusually weak or tired; yellowing of the eyes or skin -stomach pain -trouble passing urine or change in the amount of urine Side effects that usually do not require medical attention (report to your doctor or health care professional if they continue or are bothersome): -cough -diarrhea -joint pain -muscle pain -muscle weakness -tiredness -weight loss This list may not describe all   possible side effects. Call your doctor for medical advice about side effects. You may report side effects to FDA at 1-800-FDA-1088. Where should  I keep my medicine? This drug is given in a hospital or clinic and will not be stored at home. NOTE: This sheet is a summary. It may not cover all possible information. If you have questions about this medicine, talk to your doctor, pharmacist, or health care provider.  2018 Elsevier/Gold Standard (2015-06-18 17:54:14)  

## 2016-12-07 NOTE — Progress Notes (Signed)
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  INTERVAL HISTORY: Patient returns to clinic today for further evaluation and consideration of cycle 4 of Tecentriq. She is tolerating her infusions well with only a mild, chronic, nonproductive cough. She has right knee pain which resulted in 2 falls over the past several weeks, but otherwise feels well and is asymptomatic. She has no neurologic complaints. She denies any recent fevers or illnesses. She has a good appetite and denies weight loss. She has no chest pain or shortness of breath. She denies any nausea, vomiting, constipation, or diarrhea. She has no urinary complaints. Patient offers no further specific complaints today.   REVIEW OF SYSTEMS:   Review of Systems  Constitutional: Negative.  Negative for fever, malaise/fatigue and weight loss.  Respiratory: Positive for cough. Negative for shortness of breath.   Cardiovascular: Negative.  Negative for chest pain and leg swelling.  Gastrointestinal: Negative.  Negative for abdominal pain, nausea and vomiting.  Genitourinary: Negative.   Musculoskeletal: Positive for falls and joint pain.  Skin: Negative.  Negative for rash.  Neurological: Negative.  Negative for sensory change and weakness.  Psychiatric/Behavioral: Negative.  The patient is not nervous/anxious.    As per HPI. Otherwise, a complete review of systems is negative.   PAST MEDICAL HISTORY: Past Medical History:  Diagnosis Date  . Atrial fibrillation ([place])   . CHF (congestive heart failure) ([place])   . COPD (chronic obstructive pulmonary disease) ([place])   . Diabetes mellitus without complication ([place])   . Hyperlipidemia   .  Hypertension   . Hypothyroidism   . Lung cancer [facility]) 2015   Dr Merleen Nicely [facility], chemo and rad  . Personal history of chemotherapy   . Personal history of radiation therapy   . Thyroid disease     PAST SURGICAL HISTORY: Past Surgical History:  Procedure Laterality Date  . COLONOSCOPY  2006   Dr Raynelle Fanning patient ?polyps  . COLONOSCOPY WITH PROPOFOL N/A 01/30/2015   Procedure: COLONOSCOPY WITH PROPOFOL;  Surgeon: Manya Silvas, MD;  Location: [facility] ENDOSCOPY;  Service: Endoscopy;  Laterality: N/A;  . ESOPHAGOGASTRODUODENOSCOPY (EGD) WITH PROPOFOL N/A 01/30/2015   Procedure: ESOPHAGOGASTRODUODENOSCOPY (EGD) WITH PROPOFOL;  Surgeon: Manya Silvas, MD;  Location: [facility] ENDOSCOPY;  Service: Endoscopy;  Laterality: N/A;  . JOINT REPLACEMENT    . Left knee surgery Left   . LUNG BIOPSY      FAMILY HISTORY Family History  Problem Relation Age of Onset  . Heart attack Mother   . Lung cancer Father   . Heart attack Son 8  . Breast cancer Neg Hx        ADVANCED DIRECTIVES:    HEALTH MAINTENANCE: Social History  Substance Use Topics  . Smoking status: Former Smoker    Quit date: 10/29/2013  . Smokeless tobacco: Never Used  . Alcohol use No     Colonoscopy:  PAP:  Bone density:  Lipid panel:  Allergies  Allergen Reactions  . Aleve [Naproxen Sodium]     Pt states can tolerate some, but causes GI upset  . Celebrex [Celecoxib] Other (See Comments)    Pt states can handle some, but causes GI upset    Current Outpatient Prescriptions  Medication Sig Dispense Refill  .  atorvastatin (LIPITOR) 10 MG tablet Take 10 mg by mouth daily at 6 PM.     . carvedilol (COREG) 6.25 MG tablet Take 6.25 mg by mouth 2 (two) times daily with a meal.     . Cholecalciferol (VITAMIN D3) 2000 units capsule Take 2,000 Units by mouth daily.     Marland Kitchen dexamethasone (DECADRON) 4 MG tablet Take 8 mg by mouth daily. Take for 2 days after [place] chemotherapy, then as directed.    . diclofenac sodium  (VOLTAREN) 1 % GEL Apply topically.    . empagliflozin (JARDIANCE) 10 MG TABS tablet Take 10 mg by mouth daily.    . furosemide (LASIX) 40 MG tablet Take 40 mg by mouth daily.     Marland Kitchen levothyroxine (SYNTHROID, LEVOTHROID) 50 MCG tablet Take 25 mcg by mouth daily.     Marland Kitchen losartan (COZAAR) 50 MG tablet Take 50 mg by mouth daily.     . Magnesium 250 MG TABS Take 1 tablet by mouth daily.     . metFORMIN (GLUCOPHAGE-XR) 500 MG 24 hr tablet Take 1,000 mg by mouth daily with breakfast.     . pantoprazole (PROTONIX) 40 MG tablet Take 40 mg by mouth daily.     . prochlorperazine (COMPAZINE) 10 MG tablet Take 10 mg by mouth every 6 (six) hours as needed.     . traMADol (ULTRAM) 50 MG tablet Take by mouth.    . warfarin (COUMADIN) 4 MG tablet Take 6 mg by mouth one time only at 6 PM.      No current facility-administered medications for this visit.    Facility-Administered Medications Ordered in Other Visits  Medication Dose Route Frequency Provider Last Rate Last Dose  . atezolizumab (TECENTRIQ) 1,200 mg in sodium chloride 0.9 % 250 mL chemo infusion  1,200 mg Intravenous Once Lloyd Huger, MD        OBJECTIVE: Vitals:   12/10/16 1008  BP: 123/77  Pulse: 67  Resp: 16  Temp: (!) 97 F (36.1 C)     There is no height or weight on file to calculate BMI.    ECOG FS:0 - Asymptomatic  General: Well-developed, well-nourished, no acute distress. Eyes: Pink conjunctiva, anicteric sclera. Lungs: Clear to auscultation bilaterally. Heart: Regular rate and rhythm. No rubs, murmurs, or gallops. Abdomen: Soft, nontender, nondistended. No organomegaly noted, normoactive bowel sounds. Musculoskeletal: No edema, cyanosis, or clubbing. Neuro: Alert, answering all questions appropriately. Cranial nerves grossly intact. Skin: No rashes or petechiae noted. Psych: Normal affect.   LAB RESULTS:  Lab Results  Component Value Date   NA 139 12/09/2016   K 3.3 (L) 12/09/2016   CL 99 (L) 12/09/2016   CO2  28 12/09/2016   GLUCOSE 282 (H) 12/09/2016   BUN 18 12/09/2016   CREATININE 0.65 12/09/2016   CALCIUM 9.1 12/09/2016   PROT 7.8 12/09/2016   ALBUMIN 3.2 (L) 12/09/2016   AST 25 12/09/2016   ALT 14 12/09/2016   ALKPHOS 50 12/09/2016   BILITOT 0.7 12/09/2016   GFRNONAA >60 12/09/2016   GFRAA >60 12/09/2016    Lab Results  Component Value Date   WBC 10.0 12/09/2016   NEUTROABS 8.5 (H) 12/09/2016   HGB 11.0 (L) 12/09/2016   HCT 34.2 (L) 12/09/2016   MCV 79.6 (L) 12/09/2016   PLT 294 12/09/2016    STUDIES: No results found.  ONCOLOGIC HISTORY:   Patient was initially diagnosed with a 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 PET scan on August 25, 2012 revealed a hypermetabolic nodule 2.1 cm in the left apices. Bronchoscopy on August 31, 2012 did not show any evidence of malignancy, but CT-guided biopsy on September 18, 2012 confirmed squamous cell carcinoma. Patient was determined not to be a surgical candidate at that time and underwent SBRT in May 2014. CT scan on April 23, 2013 revealed an enlarging prevascular lymph node. PET scan on June 04, 2013 revealed a new enlarging left lobe paratracheal lymph node with no other evidence of metastatic disease. Subsequent EBUS on June 13, 2013 confirmed recurrent squamous cell carcinoma. Patient underwent XRT along with concurrent navelbine between July 16, 2013 - August 28, 2013. Patient was then disease-free until CT scan on Sep 30, 2015 revealed an interval increase of a nodule measuring up to 2.3 cm in the left lower lobe. PET scan on Oct 12, 1029 was hypermetabolic in the left upper, left lower and right middle lobe nodules concerning for neoplasm. Patient completed 4 cycles of dose reduced carboplatinum and gemcitabine between July 2017 and September 2017. Patient initiated [place] on Oct 08, 2016.  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  PLAN:    1.  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 CT scan results from September 15, 2016 reviewed independently with clear progression of disease. After lengthy discussion with the patient, she has agreed to reinitiate treatment with immunotherapy. Patient continues to refuse port placement. Will give Tecentriq every 3 weeks and re-image after ~6 cycles. Proceed with cycle 4 of Tecentriq today. Return to clinic in 3 weeks for consideration of cycle 5.  2. Hypertension: Patient's blood pressure is within normal limits today. Continue current medications. Patient admits noncompliance to taking her blood pressure medications.  3. Hypokalemia: Mild. Patient was previously given dietary recommendations.  4. Anemia: Mild, monitor. 5. Aortic aneurysm: Previously evaluated in the past at [facility]. CT scan results indicate stability of her aneurysm. Patient reports surgery was recommended previously, but she declined. She indicated she will also will likely decline any interventions at this time again. Previously a referral was made, back to her cardiothoracic surgeon, but patient canceled the appointment. 6. Cough: Have recommended OTC treatments. Consider chest x-ray in the future.   Patient expressed understanding and was in agreement with this plan. She also understands that She can call clinic at any time with any questions, concerns, or complaints.    Lloyd Huger, MD   12/10/2016 10:18 AM

## 2016-12-09 ENCOUNTER — Inpatient Hospital Stay: Payer: Medicare Other | Attending: Oncology

## 2016-12-09 DIAGNOSIS — I4891 Unspecified atrial fibrillation: Secondary | ICD-10-CM | POA: Diagnosis not present

## 2016-12-09 DIAGNOSIS — C7802 Secondary malignant neoplasm of left lung: Secondary | ICD-10-CM | POA: Insufficient documentation

## 2016-12-09 DIAGNOSIS — E785 Hyperlipidemia, unspecified: Secondary | ICD-10-CM | POA: Diagnosis not present

## 2016-12-09 DIAGNOSIS — I509 Heart failure, unspecified: Secondary | ICD-10-CM | POA: Insufficient documentation

## 2016-12-09 DIAGNOSIS — Z7901 Long term (current) use of anticoagulants: Secondary | ICD-10-CM | POA: Insufficient documentation

## 2016-12-09 DIAGNOSIS — C3492 Malignant neoplasm of unspecified part of left bronchus or lung: Secondary | ICD-10-CM | POA: Insufficient documentation

## 2016-12-09 DIAGNOSIS — Z801 Family history of malignant neoplasm of trachea, bronchus and lung: Secondary | ICD-10-CM | POA: Insufficient documentation

## 2016-12-09 DIAGNOSIS — C7801 Secondary malignant neoplasm of right lung: Secondary | ICD-10-CM | POA: Diagnosis not present

## 2016-12-09 DIAGNOSIS — I10 Essential (primary) hypertension: Secondary | ICD-10-CM | POA: Diagnosis not present

## 2016-12-09 DIAGNOSIS — Z9119 Patient's noncompliance with other medical treatment and regimen: Secondary | ICD-10-CM | POA: Diagnosis not present

## 2016-12-09 DIAGNOSIS — M2550 Pain in unspecified joint: Secondary | ICD-10-CM | POA: Diagnosis not present

## 2016-12-09 DIAGNOSIS — Z7984 Long term (current) use of oral hypoglycemic drugs: Secondary | ICD-10-CM | POA: Diagnosis not present

## 2016-12-09 DIAGNOSIS — Z5112 Encounter for antineoplastic immunotherapy: Secondary | ICD-10-CM | POA: Diagnosis not present

## 2016-12-09 DIAGNOSIS — D649 Anemia, unspecified: Secondary | ICD-10-CM | POA: Diagnosis not present

## 2016-12-09 DIAGNOSIS — M25561 Pain in right knee: Secondary | ICD-10-CM | POA: Insufficient documentation

## 2016-12-09 DIAGNOSIS — J449 Chronic obstructive pulmonary disease, unspecified: Secondary | ICD-10-CM | POA: Diagnosis not present

## 2016-12-09 DIAGNOSIS — Z79899 Other long term (current) drug therapy: Secondary | ICD-10-CM | POA: Diagnosis not present

## 2016-12-09 DIAGNOSIS — I719 Aortic aneurysm of unspecified site, without rupture: Secondary | ICD-10-CM | POA: Insufficient documentation

## 2016-12-09 DIAGNOSIS — Z87891 Personal history of nicotine dependence: Secondary | ICD-10-CM | POA: Insufficient documentation

## 2016-12-09 DIAGNOSIS — E039 Hypothyroidism, unspecified: Secondary | ICD-10-CM | POA: Insufficient documentation

## 2016-12-09 DIAGNOSIS — E876 Hypokalemia: Secondary | ICD-10-CM | POA: Diagnosis not present

## 2016-12-09 DIAGNOSIS — R05 Cough: Secondary | ICD-10-CM | POA: Insufficient documentation

## 2016-12-09 LAB — CBC WITH DIFFERENTIAL/PLATELET
BASOS PCT: 0 %
Basophils Absolute: 0 10*3/uL (ref 0–0.1)
Eosinophils Absolute: 0 10*3/uL (ref 0–0.7)
Eosinophils Relative: 0 %
HEMATOCRIT: 34.2 % — AB (ref 35.0–47.0)
HEMOGLOBIN: 11 g/dL — AB (ref 12.0–16.0)
LYMPHS PCT: 6 %
Lymphs Abs: 0.6 10*3/uL — ABNORMAL LOW (ref 1.0–3.6)
MCH: 25.6 pg — ABNORMAL LOW (ref 26.0–34.0)
MCHC: 32.2 g/dL (ref 32.0–36.0)
MCV: 79.6 fL — AB (ref 80.0–100.0)
Monocytes Absolute: 0.9 10*3/uL (ref 0.2–0.9)
Monocytes Relative: 9 %
NEUTROS ABS: 8.5 10*3/uL — AB (ref 1.4–6.5)
Neutrophils Relative %: 85 %
Platelets: 294 10*3/uL (ref 150–440)
RBC: 4.3 MIL/uL (ref 3.80–5.20)
RDW: 22.9 % — ABNORMAL HIGH (ref 11.5–14.5)
WBC: 10 10*3/uL (ref 3.6–11.0)

## 2016-12-09 LAB — COMPREHENSIVE METABOLIC PANEL
ALBUMIN: 3.2 g/dL — AB (ref 3.5–5.0)
ALT: 14 U/L (ref 14–54)
AST: 25 U/L (ref 15–41)
Alkaline Phosphatase: 50 U/L (ref 38–126)
Anion gap: 12 (ref 5–15)
BILIRUBIN TOTAL: 0.7 mg/dL (ref 0.3–1.2)
BUN: 18 mg/dL (ref 6–20)
CO2: 28 mmol/L (ref 22–32)
CREATININE: 0.65 mg/dL (ref 0.44–1.00)
Calcium: 9.1 mg/dL (ref 8.9–10.3)
Chloride: 99 mmol/L — ABNORMAL LOW (ref 101–111)
GFR calc Af Amer: >60 mL/min (ref >60)
GLUCOSE: 282 mg/dL — AB (ref 65–99)
POTASSIUM: 3.3 mmol/L — AB (ref 3.5–5.1)
Sodium: 139 mmol/L (ref 135–145)
TOTAL PROTEIN: 7.8 g/dL (ref 6.5–8.1)

## 2016-12-10 ENCOUNTER — Inpatient Hospital Stay (HOSPITAL_BASED_OUTPATIENT_CLINIC_OR_DEPARTMENT_OTHER): Payer: Medicare Other | Admitting: Oncology

## 2016-12-10 ENCOUNTER — Inpatient Hospital Stay: Payer: Medicare Other

## 2016-12-10 VITALS — BP 123/77 | HR 67 | Temp 97.0°F | Resp 16

## 2016-12-10 DIAGNOSIS — I10 Essential (primary) hypertension: Secondary | ICD-10-CM

## 2016-12-10 DIAGNOSIS — M2550 Pain in unspecified joint: Secondary | ICD-10-CM

## 2016-12-10 DIAGNOSIS — I4891 Unspecified atrial fibrillation: Secondary | ICD-10-CM

## 2016-12-10 DIAGNOSIS — I509 Heart failure, unspecified: Secondary | ICD-10-CM | POA: Diagnosis not present

## 2016-12-10 DIAGNOSIS — E785 Hyperlipidemia, unspecified: Secondary | ICD-10-CM

## 2016-12-10 DIAGNOSIS — C3492 Malignant neoplasm of unspecified part of left bronchus or lung: Secondary | ICD-10-CM | POA: Diagnosis not present

## 2016-12-10 DIAGNOSIS — Z7984 Long term (current) use of oral hypoglycemic drugs: Secondary | ICD-10-CM

## 2016-12-10 DIAGNOSIS — M25561 Pain in right knee: Secondary | ICD-10-CM | POA: Diagnosis not present

## 2016-12-10 DIAGNOSIS — J449 Chronic obstructive pulmonary disease, unspecified: Secondary | ICD-10-CM

## 2016-12-10 DIAGNOSIS — Z87891 Personal history of nicotine dependence: Secondary | ICD-10-CM

## 2016-12-10 DIAGNOSIS — R05 Cough: Secondary | ICD-10-CM | POA: Diagnosis not present

## 2016-12-10 DIAGNOSIS — I719 Aortic aneurysm of unspecified site, without rupture: Secondary | ICD-10-CM | POA: Diagnosis not present

## 2016-12-10 DIAGNOSIS — E039 Hypothyroidism, unspecified: Secondary | ICD-10-CM

## 2016-12-10 DIAGNOSIS — Z9119 Patient's noncompliance with other medical treatment and regimen: Secondary | ICD-10-CM

## 2016-12-10 DIAGNOSIS — E876 Hypokalemia: Secondary | ICD-10-CM | POA: Diagnosis not present

## 2016-12-10 DIAGNOSIS — C7802 Secondary malignant neoplasm of left lung: Secondary | ICD-10-CM

## 2016-12-10 DIAGNOSIS — C7801 Secondary malignant neoplasm of right lung: Secondary | ICD-10-CM | POA: Diagnosis not present

## 2016-12-10 DIAGNOSIS — Z801 Family history of malignant neoplasm of trachea, bronchus and lung: Secondary | ICD-10-CM

## 2016-12-10 DIAGNOSIS — Z79899 Other long term (current) drug therapy: Secondary | ICD-10-CM

## 2016-12-10 DIAGNOSIS — D649 Anemia, unspecified: Secondary | ICD-10-CM

## 2016-12-10 DIAGNOSIS — Z7901 Long term (current) use of anticoagulants: Secondary | ICD-10-CM

## 2016-12-10 MED ORDER — SODIUM CHLORIDE 0.9 % IV SOLN
Freq: Once | INTRAVENOUS | Status: AC
  Administered 2016-12-10: 11:00:00 via INTRAVENOUS
  Filled 2016-12-10: qty 1000

## 2016-12-10 MED ORDER — SODIUM CHLORIDE 0.9 % IV SOLN
1200.0000 mg | Freq: Once | INTRAVENOUS | Status: AC
  Administered 2016-12-10: 1200 mg via INTRAVENOUS
  Filled 2016-12-10 (×2): qty 20

## 2016-12-10 NOTE — Patient Instructions (Signed)
Atezolizumab injection What is this medicine? ATEZOLIZUMAB (a te zoe LIZ ue mab) is a monoclonal antibody. It is used to treat bladder cancer (urothelial cancer) and non-small cell lung cancer. This medicine may be used for other purposes; ask your health care provider or pharmacist if you have questions. COMMON BRAND NAME(S): Tecentriq What should I tell my health care provider before I take this medicine? They need to know if you have any of these conditions: -diabetes -immune system problems -infection -inflammatory bowel disease -liver disease -lung or breathing disease -lupus -nervous system problems like myasthenia gravis or Guillain-Barre syndrome -organ transplant -an unusual or allergic reaction to atezolizumab, other medicines, foods, dyes, or preservatives -pregnant or trying to get pregnant -breast-feeding How should I use this medicine? This medicine is for infusion into a vein. It is given by a health care professional in a hospital or clinic setting. A special MedGuide will be given to you before each treatment. Be sure to read this information carefully each time. Talk to your pediatrician regarding the use of this medicine in children. Special care may be needed. Overdosage: If you think you have taken too much of this medicine contact a poison control center or emergency room at once. NOTE: This medicine is only for you. Do not share this medicine with others. What if I miss a dose? It is important not to miss your dose. Call your doctor or health care professional if you are unable to keep an appointment. What may interact with this medicine? Interactions have not been studied. This list may not describe all possible interactions. Give your health care provider a list of all the medicines, herbs, non-prescription drugs, or dietary supplements you use. Also tell them if you smoke, drink alcohol, or use illegal drugs. Some items may interact with your medicine. What  should I watch for while using this medicine? Your condition will be monitored carefully while you are receiving this medicine. You may need blood work done while you are taking this medicine. Do not become pregnant while taking this medicine or for at least 5 months after stopping it. Women should inform their doctor if they wish to become pregnant or think they might be pregnant. There is a potential for serious side effects to an unborn child. Talk to your health care professional or pharmacist for more information. Do not breast-feed an infant while taking this medicine or for at least 5 months after the last dose. What side effects may I notice from receiving this medicine? Side effects that you should report to your doctor or health care professional as soon as possible: -allergic reactions like skin rash, itching or hives, swelling of the face, lips, or tongue -black, tarry stools -bloody or watery diarrhea -breathing problems -changes in vision -chest pain or chest tightness -chills -facial flushing -fever -headache -signs and symptoms of high blood sugar such as dizziness; dry mouth; dry skin; fruity breath; nausea; stomach pain; increased hunger or thirst; increased urination -signs and symptoms of liver injury like dark yellow or brown urine; general ill feeling or flu-like symptoms; light-colored stools; loss of appetite; nausea; right upper belly pain; unusually weak or tired; yellowing of the eyes or skin -stomach pain -trouble passing urine or change in the amount of urine Side effects that usually do not require medical attention (report to your doctor or health care professional if they continue or are bothersome): -cough -diarrhea -joint pain -muscle pain -muscle weakness -tiredness -weight loss This list may not describe all   possible side effects. Call your doctor for medical advice about side effects. You may report side effects to FDA at 1-800-FDA-1088. Where should  I keep my medicine? This drug is given in a hospital or clinic and will not be stored at home. NOTE: This sheet is a summary. It may not cover all possible information. If you have questions about this medicine, talk to your doctor, pharmacist, or health care provider.  2018 Elsevier/Gold Standard (2015-06-18 17:54:14)  

## 2016-12-10 NOTE — Progress Notes (Signed)
Patient her for pre treatment check. She experienced a fall this week.

## 2016-12-20 ENCOUNTER — Other Ambulatory Visit: Payer: Self-pay | Admitting: Internal Medicine

## 2016-12-20 DIAGNOSIS — C3490 Malignant neoplasm of unspecified part of unspecified bronchus or lung: Secondary | ICD-10-CM

## 2016-12-20 DIAGNOSIS — R270 Ataxia, unspecified: Secondary | ICD-10-CM

## 2016-12-27 ENCOUNTER — Ambulatory Visit
Admission: RE | Admit: 2016-12-27 | Discharge: 2016-12-27 | Disposition: A | Discharge status: Home / Self Care | Payer: Medicare Other | Source: Ambulatory Visit | Attending: Internal Medicine | Admitting: Internal Medicine

## 2016-12-27 ENCOUNTER — Telehealth: Payer: Self-pay | Admitting: *Deleted

## 2016-12-27 DIAGNOSIS — I619 Nontraumatic intracerebral hemorrhage, unspecified: Secondary | ICD-10-CM | POA: Insufficient documentation

## 2016-12-27 DIAGNOSIS — C3490 Malignant neoplasm of unspecified part of unspecified bronchus or lung: Secondary | ICD-10-CM | POA: Diagnosis present

## 2016-12-27 DIAGNOSIS — C7931 Secondary malignant neoplasm of brain: Secondary | ICD-10-CM | POA: Diagnosis not present

## 2016-12-27 DIAGNOSIS — R270 Ataxia, unspecified: Secondary | ICD-10-CM | POA: Diagnosis present

## 2016-12-27 DIAGNOSIS — R29898 Other symptoms and signs involving the musculoskeletal system: Secondary | ICD-10-CM | POA: Diagnosis present

## 2016-12-27 DIAGNOSIS — G936 Cerebral edema: Secondary | ICD-10-CM | POA: Diagnosis not present

## 2016-12-27 MED ORDER — GADOBENATE DIMEGLUMINE 529 MG/ML IV SOLN
17.0000 mL | Freq: Once | INTRAVENOUS | Status: AC | PRN
  Administered 2016-12-27: 17 mL via INTRAVENOUS

## 2016-12-27 NOTE — Telephone Encounter (Signed)
Dr. Tonette Bihari office called over to make Dr. Grayland Ormond aware that patient had MRI brain today and results are in, see EPIC for results.

## 2016-12-28 ENCOUNTER — Inpatient Hospital Stay: Payer: Medicare Other

## 2016-12-28 ENCOUNTER — Other Ambulatory Visit: Payer: Self-pay | Admitting: *Deleted

## 2016-12-28 DIAGNOSIS — C3492 Malignant neoplasm of unspecified part of left bronchus or lung: Secondary | ICD-10-CM

## 2016-12-28 DIAGNOSIS — C801 Malignant (primary) neoplasm, unspecified: Secondary | ICD-10-CM

## 2016-12-28 LAB — COMPREHENSIVE METABOLIC PANEL
ALK PHOS: 54 U/L (ref 38–126)
ALT: 11 U/L — ABNORMAL LOW (ref 14–54)
AST: 16 U/L (ref 15–41)
Albumin: 3.1 g/dL — ABNORMAL LOW (ref 3.5–5.0)
Anion gap: 10 (ref 5–15)
BILIRUBIN TOTAL: 0.7 mg/dL (ref 0.3–1.2)
BUN: 19 mg/dL (ref 6–20)
CALCIUM: 8.9 mg/dL (ref 8.9–10.3)
CO2: 31 mmol/L (ref 22–32)
Chloride: 95 mmol/L — ABNORMAL LOW (ref 101–111)
Creatinine, Ser: 0.69 mg/dL (ref 0.44–1.00)
GFR calc Af Amer: >60 mL/min (ref >60)
GFR calc non Af Amer: >60 mL/min (ref >60)
GLUCOSE: 189 mg/dL — AB (ref 65–99)
POTASSIUM: 3.6 mmol/L (ref 3.5–5.1)
SODIUM: 136 mmol/L (ref 135–145)
TOTAL PROTEIN: 7.8 g/dL (ref 6.5–8.1)

## 2016-12-28 LAB — CBC WITH DIFFERENTIAL/PLATELET
BASOS ABS: 0 10*3/uL (ref 0–0.1)
BASOS PCT: 1 %
EOS ABS: 0.1 10*3/uL (ref 0–0.7)
Eosinophils Relative: 1 %
HEMATOCRIT: 34.8 % — AB (ref 35.0–47.0)
HEMOGLOBIN: 11 g/dL — AB (ref 12.0–16.0)
Lymphocytes Relative: 9 %
Lymphs Abs: 0.7 10*3/uL — ABNORMAL LOW (ref 1.0–3.6)
MCH: 25.9 pg — ABNORMAL LOW (ref 26.0–34.0)
MCHC: 31.7 g/dL — ABNORMAL LOW (ref 32.0–36.0)
MCV: 81.8 fL (ref 80.0–100.0)
Monocytes Absolute: 0.6 10*3/uL (ref 0.2–0.9)
Monocytes Relative: 9 %
NEUTROS ABS: 5.8 10*3/uL (ref 1.4–6.5)
NEUTROS PCT: 80 %
Platelets: 264 10*3/uL (ref 150–440)
RBC: 4.25 MIL/uL (ref 3.80–5.20)
RDW: 24.2 % — ABNORMAL HIGH (ref 11.5–14.5)
WBC: 7.3 10*3/uL (ref 3.6–11.0)

## 2016-12-29 LAB — THYROID PANEL WITH TSH
FREE THYROXINE INDEX: 2.2 (ref 1.2–4.9)
T3 Uptake Ratio: 31 % (ref 24–39)
T4 TOTAL: 7 ug/dL (ref 4.5–12.0)
TSH: 0.516 u[IU]/mL (ref 0.450–4.500)

## 2016-12-30 ENCOUNTER — Other Ambulatory Visit: Payer: Medicare Other

## 2016-12-31 ENCOUNTER — Inpatient Hospital Stay: Payer: Medicare Other | Attending: Oncology | Admitting: Oncology

## 2016-12-31 ENCOUNTER — Inpatient Hospital Stay: Payer: Medicare Other

## 2016-12-31 VITALS — BP 168/78 | HR 61 | Temp 96.8°F | Resp 20 | Wt 181.2 lb

## 2016-12-31 VITALS — BP 118/71 | HR 61 | Resp 62

## 2016-12-31 DIAGNOSIS — R42 Dizziness and giddiness: Secondary | ICD-10-CM

## 2016-12-31 DIAGNOSIS — Z7984 Long term (current) use of oral hypoglycemic drugs: Secondary | ICD-10-CM | POA: Insufficient documentation

## 2016-12-31 DIAGNOSIS — Z9221 Personal history of antineoplastic chemotherapy: Secondary | ICD-10-CM | POA: Diagnosis not present

## 2016-12-31 DIAGNOSIS — I509 Heart failure, unspecified: Secondary | ICD-10-CM | POA: Diagnosis not present

## 2016-12-31 DIAGNOSIS — E785 Hyperlipidemia, unspecified: Secondary | ICD-10-CM | POA: Diagnosis not present

## 2016-12-31 DIAGNOSIS — J449 Chronic obstructive pulmonary disease, unspecified: Secondary | ICD-10-CM | POA: Insufficient documentation

## 2016-12-31 DIAGNOSIS — C7802 Secondary malignant neoplasm of left lung: Secondary | ICD-10-CM | POA: Diagnosis not present

## 2016-12-31 DIAGNOSIS — Z5112 Encounter for antineoplastic immunotherapy: Secondary | ICD-10-CM | POA: Insufficient documentation

## 2016-12-31 DIAGNOSIS — I714 Abdominal aortic aneurysm, without rupture: Secondary | ICD-10-CM | POA: Diagnosis not present

## 2016-12-31 DIAGNOSIS — I4891 Unspecified atrial fibrillation: Secondary | ICD-10-CM | POA: Diagnosis not present

## 2016-12-31 DIAGNOSIS — C7931 Secondary malignant neoplasm of brain: Secondary | ICD-10-CM | POA: Insufficient documentation

## 2016-12-31 DIAGNOSIS — Z9119 Patient's noncompliance with other medical treatment and regimen: Secondary | ICD-10-CM | POA: Insufficient documentation

## 2016-12-31 DIAGNOSIS — C3492 Malignant neoplasm of unspecified part of left bronchus or lung: Secondary | ICD-10-CM | POA: Diagnosis not present

## 2016-12-31 DIAGNOSIS — D649 Anemia, unspecified: Secondary | ICD-10-CM | POA: Diagnosis not present

## 2016-12-31 DIAGNOSIS — E119 Type 2 diabetes mellitus without complications: Secondary | ICD-10-CM | POA: Diagnosis not present

## 2016-12-31 DIAGNOSIS — Z801 Family history of malignant neoplasm of trachea, bronchus and lung: Secondary | ICD-10-CM | POA: Insufficient documentation

## 2016-12-31 DIAGNOSIS — G936 Cerebral edema: Secondary | ICD-10-CM | POA: Insufficient documentation

## 2016-12-31 DIAGNOSIS — R5383 Other fatigue: Secondary | ICD-10-CM | POA: Diagnosis not present

## 2016-12-31 DIAGNOSIS — E876 Hypokalemia: Secondary | ICD-10-CM | POA: Insufficient documentation

## 2016-12-31 DIAGNOSIS — R0602 Shortness of breath: Secondary | ICD-10-CM | POA: Diagnosis not present

## 2016-12-31 DIAGNOSIS — Z7901 Long term (current) use of anticoagulants: Secondary | ICD-10-CM

## 2016-12-31 DIAGNOSIS — Z923 Personal history of irradiation: Secondary | ICD-10-CM | POA: Insufficient documentation

## 2016-12-31 DIAGNOSIS — R531 Weakness: Secondary | ICD-10-CM | POA: Diagnosis not present

## 2016-12-31 DIAGNOSIS — C7801 Secondary malignant neoplasm of right lung: Secondary | ICD-10-CM | POA: Diagnosis not present

## 2016-12-31 DIAGNOSIS — I10 Essential (primary) hypertension: Secondary | ICD-10-CM | POA: Diagnosis not present

## 2016-12-31 DIAGNOSIS — E039 Hypothyroidism, unspecified: Secondary | ICD-10-CM | POA: Diagnosis not present

## 2016-12-31 DIAGNOSIS — Z87891 Personal history of nicotine dependence: Secondary | ICD-10-CM | POA: Insufficient documentation

## 2016-12-31 DIAGNOSIS — Z79899 Other long term (current) drug therapy: Secondary | ICD-10-CM

## 2016-12-31 MED ORDER — SODIUM CHLORIDE 0.9 % IV SOLN
Freq: Once | INTRAVENOUS | Status: AC
  Administered 2016-12-31: 10:00:00 via INTRAVENOUS
  Filled 2016-12-31: qty 1000

## 2016-12-31 MED ORDER — DEXAMETHASONE 4 MG PO TABS: 4.0000 mg | ORAL_TABLET | Freq: Four times a day (QID) | ORAL | 1 refills | Status: DC

## 2016-12-31 MED ORDER — SODIUM CHLORIDE 0.9 % IV SOLN
1200.0000 mg | Freq: Once | INTRAVENOUS | Status: AC
  Administered 2016-12-31: 1200 mg via INTRAVENOUS
  Filled 2016-12-31 (×2): qty 20

## 2016-12-31 NOTE — Progress Notes (Signed)
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  INTERVAL HISTORY: Patient returns to clinic today for Brain MRI results and consideration of cycle 5 of Tecentriq. She is tolerating her infusions well.  She was seen by Dr. Ouida Sills on December 17, 2016  for dizziness, ataxia and left arm weakness. He ordered a STAT brain MRI. She also complains of bilateral lower extremity weakness. She denies any recent fevers or illnesses. She has a good appetite and denies weight loss. She has no chest pain or shortness of breath. She denies any nausea, vomiting, constipation, or diarrhea. She has no urinary complaints. Patient offers no further specific complaints today.   REVIEW OF SYSTEMS:   Review of Systems  Constitutional: Positive for malaise/fatigue. Negative for fever and weight loss.  Respiratory: Positive for shortness of breath. Negative for cough.        With exertion  Cardiovascular: Negative.  Negative for chest pain and leg swelling.  Gastrointestinal: Negative.  Negative for abdominal pain, nausea and vomiting.  Genitourinary: Negative.   Musculoskeletal: Negative for falls and joint pain.  Skin: Negative.  Negative for rash.  Neurological: Positive for dizziness, sensory change and weakness.       Left sided arm weakness  Psychiatric/Behavioral: Negative.  The patient is not nervous/anxious.    As per HPI. Otherwise, a complete review of systems is negative.   PAST MEDICAL HISTORY: Past Medical History:  Diagnosis Date  . Atrial fibrillation ([place])   . CHF (congestive heart failure) ([place])   . COPD (chronic obstructive pulmonary disease) ([place])   . Diabetes mellitus without complication ([place])     . Hyperlipidemia   . Hypertension   . Hypothyroidism   . Lung cancer [facility]) 2015   Dr Merleen Nicely [facility], chemo and rad  . Personal history of chemotherapy   . Personal history of radiation therapy   . Thyroid disease     PAST SURGICAL HISTORY: Past Surgical History:  Procedure Laterality Date  . COLONOSCOPY  2006   Dr Raynelle Fanning patient ?polyps  . COLONOSCOPY WITH PROPOFOL N/A 01/30/2015   Procedure: COLONOSCOPY WITH PROPOFOL;  Surgeon: Manya Silvas, MD;  Location: [facility] ENDOSCOPY;  Service: Endoscopy;  Laterality: N/A;  . ESOPHAGOGASTRODUODENOSCOPY (EGD) WITH PROPOFOL N/A 01/30/2015   Procedure: ESOPHAGOGASTRODUODENOSCOPY (EGD) WITH PROPOFOL;  Surgeon: Manya Silvas, MD;  Location: [facility] ENDOSCOPY;  Service: Endoscopy;  Laterality: N/A;  . JOINT REPLACEMENT    . Left knee surgery Left   . LUNG BIOPSY      FAMILY HISTORY Family History  Problem Relation Age of Onset  . Heart attack Mother   . Lung cancer Father   . Heart attack Son 34  . Breast cancer Neg Hx        ADVANCED DIRECTIVES:    HEALTH MAINTENANCE: Social History  Substance Use Topics  . Smoking status: Former Smoker    Quit date: 10/29/2013  . Smokeless tobacco: Never Used  . Alcohol use No     Colonoscopy:  PAP:  Bone density:  Lipid panel:  Allergies  Allergen Reactions  . Aleve [Naproxen Sodium]     Pt states can tolerate some, but causes GI upset  . Celebrex [Celecoxib] Other (See Comments)    Pt states can  handle some, but causes GI upset    Current Outpatient Prescriptions  Medication Sig Dispense Refill  . atorvastatin (LIPITOR) 10 MG tablet Take 10 mg by mouth daily at 6 PM.     . carvedilol (COREG) 6.25 MG tablet Take 6.25 mg by mouth 2 (two) times daily with a meal.     . Cholecalciferol (VITAMIN D3) 2000 units capsule Take 2,000 Units by mouth daily.     . diclofenac sodium (VOLTAREN) 1 % GEL Apply topically.    . empagliflozin (JARDIANCE) 10 MG TABS tablet Take 10 mg by mouth daily.     . furosemide (LASIX) 40 MG tablet Take 40 mg by mouth daily.     Marland Kitchen levothyroxine (SYNTHROID, LEVOTHROID) 50 MCG tablet Take 25 mcg by mouth daily.     Marland Kitchen losartan (COZAAR) 50 MG tablet Take 50 mg by mouth daily.     . Magnesium 250 MG TABS Take 1 tablet by mouth daily.     . metFORMIN (GLUCOPHAGE-XR) 500 MG 24 hr tablet Take 1,000 mg by mouth daily with breakfast.     . pantoprazole (PROTONIX) 40 MG tablet Take 40 mg by mouth daily.     . prochlorperazine (COMPAZINE) 10 MG tablet Take 10 mg by mouth every 6 (six) hours as needed.     . traMADol (ULTRAM) 50 MG tablet Take by mouth.    . warfarin (COUMADIN) 4 MG tablet Take 4 mg by mouth one time only at 6 PM.     . dexamethasone (DECADRON) 4 MG tablet Take 1 tablet (4 mg total) by mouth 4 (four) times daily. 120 tablet 1   No current facility-administered medications for this visit.     OBJECTIVE: Vitals:   12/31/16 0952  BP: (!) 168/78  Pulse: 61  Resp: 20  Temp: (!) 96.8 F (36 C)     Body mass index is 32.1 kg/m.    ECOG FS:0 - Asymptomatic  General: Well-developed, well-nourished, no acute distress. Eyes: Pink conjunctiva, anicteric sclera. Lungs: Clear to auscultation bilaterally. Heart: Regular rate and rhythm. No rubs, murmurs, or gallops. Abdomen: Soft, nontender, nondistended. No organomegaly noted, normoactive bowel sounds. Musculoskeletal: No edema, cyanosis, or clubbing. Neuro: Alert, answering all questions appropriately. Cranial nerves grossly intact. Skin: No rashes or petechiae noted. Psych: Normal affect.   LAB RESULTS:  Lab Results  Component Value Date   NA 136 12/28/2016   K 3.6 12/28/2016   CL 95 (L) 12/28/2016   CO2 31 12/28/2016   GLUCOSE 189 (H) 12/28/2016   BUN 19 12/28/2016   CREATININE 0.69 12/28/2016   CALCIUM 8.9 12/28/2016   PROT 7.8 12/28/2016   ALBUMIN 3.1 (L) 12/28/2016   AST 16 12/28/2016   ALT 11 (L) 12/28/2016   ALKPHOS 54 12/28/2016   BILITOT 0.7 12/28/2016   GFRNONAA >60  12/28/2016   GFRAA >60 12/28/2016    Lab Results  Component Value Date   WBC 7.3 12/28/2016   NEUTROABS 5.8 12/28/2016   HGB 11.0 (L) 12/28/2016   HCT 34.8 (L) 12/28/2016   MCV 81.8 12/28/2016   PLT 264 12/28/2016    STUDIES: Mr Jeri Cos [place] Contrast  Result Date: 12/27/2016 CLINICAL DATA:  Metastatic lung cancer EXAM: MRI HEAD WITHOUT AND WITH CONTRAST TECHNIQUE: Multiplanar, multiecho pulse sequences of the brain and surrounding structures were obtained without and with intravenous contrast. CONTRAST:  17 mL MultiHance IV COMPARISON:  CT head 07/22/2012 FINDINGS: Brain: Multiple enhancing mass lesions in the brain compatible with widespread metastatic  disease. 8 mm lesion right posterior temporal lobe 5.6 mm posterior left temporal lobe 12 mm right inferior frontal lesion with surrounding edema 9 mm right parietal lesion with surrounding edema 11 mm enhancing mass right choroid plexus 3 mm lesion left occipital lobe 5 mm right frontal lesion 9 mm left medial parietal lobe with surrounding edema 12 mm necrotic lesion left posterior parietal lobe with surrounding edema 10 mm right parietal lesion 19 mm necrotic lesion right frontal lobe with surrounding edema 2 small lesions in the left parietal lobe over the convexity Generalized atrophy. Extensive brain edema in both cerebral hemispheres without midline shift. No lesions in the posterior fossa. Left posterior parietal lesion shows mild hemorrhage. Negative for acute infarct. 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 There is extensive cerebral edema without midline shift. Intra tumoral hemorrhage left parietal lobe. These results will be called to the ordering clinician or representative by the Radiologist Assistant, and communication documented in the PACS or zVision Dashboard.  Electronically Signed   By: Franchot Gallo M.D.   On: 12/27/2016 11:43    ONCOLOGIC HISTORY:   Patient was initially diagnosed with a 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 PET scan on August 25, 2012 revealed a hypermetabolic nodule 2.1 cm in the left apices. Bronchoscopy on August 31, 2012 did not show any evidence of malignancy, but CT-guided biopsy on September 18, 2012 confirmed squamous cell carcinoma. Patient was determined not to be a surgical candidate at that time and underwent SBRT in May 2014. CT scan on April 23, 2013 revealed an enlarging prevascular lymph node. PET scan on June 04, 2013 revealed a new enlarging left lobe paratracheal lymph node with no other evidence of metastatic disease. Subsequent EBUS on June 13, 2013 confirmed recurrent squamous cell carcinoma. Patient underwent XRT along with concurrent navelbine between July 16, 2013 - August 28, 2013. Patient was then disease-free until CT scan on Sep 30, 2015 revealed an interval increase of a nodule measuring up to 2.3 cm in the left lower lobe. PET scan on Oct 13, 5186 was hypermetabolic in the left upper, left lower and right middle lobe nodules concerning for neoplasm. Patient completed 4 cycles of dose reduced carboplatinum and gemcitabine between July 2017 and September 2017. Patient initiated [place] on Oct 08, 2016.  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  PLAN:    1. 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 CT scan results from September 15, 2016 reviewed independently with clear progression of disease. After lengthy discussion with the patient, she has agreed to reinitiate treatment with immunotherapy. Patient continues to refuse port placement. Will give Tecentriq every 3 weeks and re-image after ~6 cycles. Proceed with cycle 5 of Tecentriq today. Return to  clinic in 3 weeks for consideration of cycle 6 2. Dizziness/Ataxia/Left arm weakness: MRI brain extensive metastatic disease to the brain with at least 13 metastatic deposits. There is extensive cerebral edema without midline shift. Intra tumoral hemorrhage left parietal lobe.She was started on 4 mg Decadron QID. She will see Dr. Baruch Gouty on Monday for simulation in preparation for whole brain radiation.  3. Hypertension: Elevated today. Continue current medications. Patient admits noncompliance to taking her blood pressure medications.  4. Hypokalemia: Resolved.  5. Anemia:  Mild, monitor. 6. Aortic aneurysm: Previously evaluated in the past at [facility]. CT scan results indicate stability of her aneurysm. Patient reports surgery was recommended previously, but she declined. She indicated she will also will likely decline any interventions at this time again. Previously a referral was made, back to her cardiothoracic surgeon, but patient canceled the appointment. 7. Cough: Resolved.    Patient expressed understanding and was in agreement with this plan. She also understands that She can call clinic at any time with any questions, concerns, or complaints.    Jacquelin Hawking, NP   12/31/2016 12:03 PM

## 2016-12-31 NOTE — Progress Notes (Signed)
Patient reports weakness in her legs today.

## 2017-01-03 ENCOUNTER — Ambulatory Visit
Admission: RE | Admit: 2017-01-03 | Discharge: 2017-01-03 | Disposition: A | Discharge status: Home / Self Care | Payer: Medicare Other | Source: Ambulatory Visit | Attending: Radiation Oncology | Admitting: Radiation Oncology

## 2017-01-03 ENCOUNTER — Encounter: Payer: Self-pay | Admitting: Radiation Oncology

## 2017-01-03 VITALS — BP 158/68 | Temp 97.3°F | Resp 18 | Wt 184.2 lb

## 2017-01-03 DIAGNOSIS — C7931 Secondary malignant neoplasm of brain: Secondary | ICD-10-CM | POA: Insufficient documentation

## 2017-01-03 DIAGNOSIS — Z7901 Long term (current) use of anticoagulants: Secondary | ICD-10-CM | POA: Insufficient documentation

## 2017-01-03 DIAGNOSIS — I10 Essential (primary) hypertension: Secondary | ICD-10-CM | POA: Insufficient documentation

## 2017-01-03 DIAGNOSIS — Z510 Encounter for antineoplastic radiation therapy: Secondary | ICD-10-CM | POA: Diagnosis not present

## 2017-01-03 DIAGNOSIS — C3492 Malignant neoplasm of unspecified part of left bronchus or lung: Secondary | ICD-10-CM | POA: Diagnosis not present

## 2017-01-03 DIAGNOSIS — I4891 Unspecified atrial fibrillation: Secondary | ICD-10-CM | POA: Diagnosis not present

## 2017-01-03 DIAGNOSIS — J449 Chronic obstructive pulmonary disease, unspecified: Secondary | ICD-10-CM | POA: Diagnosis not present

## 2017-01-03 DIAGNOSIS — Z79899 Other long term (current) drug therapy: Secondary | ICD-10-CM | POA: Insufficient documentation

## 2017-01-03 DIAGNOSIS — Z7984 Long term (current) use of oral hypoglycemic drugs: Secondary | ICD-10-CM | POA: Diagnosis not present

## 2017-01-03 DIAGNOSIS — I509 Heart failure, unspecified: Secondary | ICD-10-CM | POA: Insufficient documentation

## 2017-01-03 DIAGNOSIS — E119 Type 2 diabetes mellitus without complications: Secondary | ICD-10-CM | POA: Insufficient documentation

## 2017-01-03 DIAGNOSIS — Z9221 Personal history of antineoplastic chemotherapy: Secondary | ICD-10-CM | POA: Insufficient documentation

## 2017-01-03 DIAGNOSIS — C7800 Secondary malignant neoplasm of unspecified lung: Secondary | ICD-10-CM | POA: Diagnosis not present

## 2017-01-03 DIAGNOSIS — E785 Hyperlipidemia, unspecified: Secondary | ICD-10-CM | POA: Diagnosis not present

## 2017-01-03 DIAGNOSIS — Z87891 Personal history of nicotine dependence: Secondary | ICD-10-CM | POA: Insufficient documentation

## 2017-01-03 DIAGNOSIS — E039 Hypothyroidism, unspecified: Secondary | ICD-10-CM | POA: Insufficient documentation

## 2017-01-03 NOTE — Consult Note (Signed)
NEW PATIENT EVALUATION  Name: Kristen [name]  MRN: [id number]  Date:   01/03/2017     DOB: 11-Apr-1941   This 76 y.o. 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 Cancer    Pt is here for initial consultation of brain metastasis    DIAGNOSIS: The encounter diagnosis was Brain metastasis ([place]).   PREVIOUS INVESTIGATIONS:  MRI of her brain and chest CT scan reviewed Pathology reports reviewed Clinical notes reviewed  HPI: patient is a 76 year old female initially presented with stage I squamous cell carcinoma of the left lung status post SB RT treatment at [facility]. She went on to progressive disease in her with PET CT scan showing progressive low paratracheal lymph nodes. This was biopsy confirmed metastatic squamous cell carcinoma.she underwent again concurrent chemoradiation therapy to her mediastinum back in 2015.she is currently being treated with the fourth cycle of Tecentriqand tolerating that fairly well. She has developed left lower extremity weakness MRI scan of her brain shows multiple areas of brain metastasis.she does have bilateral lung metastasis. I been asked to evaluate the patient for possible palliative radiation therapy. CT scan of the chest also shows markedly enlarging left midlung field carcinoma.  PLANNED TREATMENT REGIMEN: hole brain radiation  PAST MEDICAL HISTORY:  has a past medical history of Atrial fibrillation ([place]); CHF (congestive heart failure) ([place]); COPD (chronic obstructive pulmonary disease) ([place]); Diabetes mellitus without complication ([place]); Hyperlipidemia; Hypertension; Hypothyroidism; Lung cancer ([place]) (2015); Personal history of chemotherapy; Personal history of radiation therapy; and Thyroid disease.    PAST SURGICAL HISTORY:  Past  Surgical History:  Procedure Laterality Date  . COLONOSCOPY  2006   Dr Raynelle Fanning patient ?polyps  . COLONOSCOPY WITH PROPOFOL N/A 01/30/2015   Procedure: COLONOSCOPY WITH PROPOFOL;  Surgeon: Manya Silvas, MD;  Location: [facility] ENDOSCOPY;  Service: Endoscopy;  Laterality: N/A;  . ESOPHAGOGASTRODUODENOSCOPY (EGD) WITH PROPOFOL N/A 01/30/2015   Procedure: ESOPHAGOGASTRODUODENOSCOPY (EGD) WITH PROPOFOL;  Surgeon: Manya Silvas, MD;  Location: [facility] ENDOSCOPY;  Service: Endoscopy;  Laterality: N/A;  . JOINT REPLACEMENT    . Left knee surgery Left   . LUNG BIOPSY      FAMILY HISTORY: family history includes Heart attack in her mother; Heart attack (age of onset: 79) in her son; Lung cancer in her father.  SOCIAL HISTORY:  reports that she quit smoking about 3 years ago. She has never used smokeless tobacco. She reports that she does not drink alcohol or use drugs.  ALLERGIES: Aleve [naproxen sodium] and Celebrex [celecoxib]  MEDICATIONS:  Current Outpatient Prescriptions  Medication Sig Dispense Refill  . atorvastatin (LIPITOR) 10 MG tablet Take 10 mg by mouth daily at 6 PM.     . carvedilol (COREG) 6.25 MG tablet Take 6.25 mg by mouth 2 (two) times daily with a meal.     . Cholecalciferol (VITAMIN D3) 2000 units capsule Take 2,000 Units by mouth daily.     Marland Kitchen dexamethasone (DECADRON) 4 MG tablet Take 1 tablet (4 mg total) by mouth 4 (four) times daily. 120 tablet 1  . diclofenac sodium (VOLTAREN) 1 % GEL Apply topically.    . empagliflozin (JARDIANCE) 10 MG TABS tablet Take 10 mg by mouth daily.    . furosemide (LASIX) 40 MG tablet Take 40 mg by mouth daily.     Marland Kitchen  levothyroxine (SYNTHROID, LEVOTHROID) 50 MCG tablet Take 25 mcg by mouth daily.     Marland Kitchen losartan (COZAAR) 50 MG tablet Take 50 mg by mouth daily.     . Magnesium 250 MG TABS Take 1 tablet by mouth daily.     . metFORMIN (GLUCOPHAGE-XR) 500 MG 24 hr tablet Take 1,000 mg by mouth daily with breakfast.     . pantoprazole (PROTONIX)  40 MG tablet Take 40 mg by mouth daily.     . prochlorperazine (COMPAZINE) 10 MG tablet Take 10 mg by mouth every 6 (six) hours as needed.     . traMADol (ULTRAM) 50 MG tablet Take by mouth.    . warfarin (COUMADIN) 4 MG tablet Take 4 mg by mouth one time only at 6 PM.      No current facility-administered medications for this encounter.     ECOG PERFORMANCE STATUS:  0 - Asymptomatic  REVIEW OF SYSTEMS:  Patient denies any weight loss, fatigue, weakness, fever, chills or night sweats. Patient denies any loss of vision, blurred vision. Patient denies any ringing  of the ears or hearing loss. No irregular heartbeat. Patient denies heart murmur or history of fainting. Patient denies any chest pain or pain radiating to her upper extremities. Patient denies any shortness of breath, difficulty breathing at night, cough or hemoptysis. Patient denies any swelling in the lower legs. Patient denies any nausea vomiting, vomiting of blood, or coffee ground material in the vomitus. Patient denies any stomach pain. Patient states has had normal bowel movements no significant constipation or diarrhea. Patient denies any dysuria, hematuria or significant nocturia. Patient denies any problems walking, swelling in the joints or loss of balance. Patient denies any skin changes, loss of hair or loss of weight. Patient denies any excessive worrying or anxiety or significant depression. Patient denies any problems with insomnia. Patient denies excessive thirst, polyuria, polydipsia. Patient denies any swollen glands, patient denies easy bruising or easy bleeding. Patient denies any recent infections, allergies or URI. Patient "s visual fields have not changed significantly in recent time.    PHYSICAL EXAM: BP (!) 158/68   Temp (!) 97.3 F (36.3 C)   Resp 18   Wt 184 lb 3.1 oz (83.6 kg)   BMI 32.63 kg/m  Patient does have some subtle decrease in strength in her left lower extremity as well as decreased proprioception  in the left lower extremity.Well-developed well-nourished patient in NAD. HEENT reveals PERLA, EOMI, discs not visualized.  Oral cavity is clear. No oral mucosal lesions are identified. Neck is clear without evidence of cervical or supraclavicular adenopathy. Lungs are clear to A&P. Cardiac examination is essentially unremarkable with regular rate and rhythm without murmur rub or thrill. Abdomen is benign with no organomegaly or masses noted. Motor sensory and DTR levels are equal and symmetric in the upper and lower extremities. Cranial nerves II through XII are grossly intact. Proprioception is intact. No peripheral adenopathy or edema is identified. No motor or sensory levels are noted. Crude visual fields are within normal range.  LABORATORY DATA: pathology reports reviewed    RADIOLOGY RESULTS:chest CT and MRI scan of brain reviewed and compatible with the above-stated findings   IMPRESSION: progressive non-small cell lung cancer in both the chest and brain in 76 year old female with known stage IV squamous cell carcinoma of the lung  PLAN: at this time I to go ahead with whole brain radiation therapy. Would plan on delivering 3000 cGy in 10 fractions. She has multiple lesions  throughout her brain. Risks and benefits of treatment including loss of hair fatigue alteration of blood counts possible skin reaction to her scalp all were discussed in detail with the patient and her family. I personally set up and ordered CT simulation for this week. Patient will continue on Decadron therapy at this time. May offer palliative radiation therapy to her chest should she continues show progression in that area and will discuss the personally with medical oncology.There will be extra effort by both professional staff as well as technical staff to coordinate and manage concurrent chemoradiation and ensuing side effects during her treatments.  I would like to take this opportunity to thank you for allowing me to  participate in the care of your patient.Armstead Peaks., MD

## 2017-01-05 ENCOUNTER — Ambulatory Visit
Admission: RE | Admit: 2017-01-05 | Discharge: 2017-01-05 | Disposition: A | Discharge status: Home / Self Care | Payer: Medicare Other | Source: Ambulatory Visit | Attending: Radiation Oncology | Admitting: Radiation Oncology

## 2017-01-05 DIAGNOSIS — Z510 Encounter for antineoplastic radiation therapy: Secondary | ICD-10-CM | POA: Diagnosis not present

## 2017-01-06 DIAGNOSIS — Z510 Encounter for antineoplastic radiation therapy: Secondary | ICD-10-CM | POA: Diagnosis not present

## 2017-01-07 ENCOUNTER — Ambulatory Visit: Payer: Medicare Other | Admitting: Oncology

## 2017-01-07 ENCOUNTER — Other Ambulatory Visit: Payer: Self-pay | Admitting: *Deleted

## 2017-01-10 ENCOUNTER — Ambulatory Visit
Admission: RE | Admit: 2017-01-10 | Discharge: 2017-01-10 | Disposition: A | Discharge status: Home / Self Care | Payer: Medicare Other | Source: Ambulatory Visit | Attending: Radiation Oncology | Admitting: Radiation Oncology

## 2017-01-10 DIAGNOSIS — Z510 Encounter for antineoplastic radiation therapy: Secondary | ICD-10-CM | POA: Diagnosis not present

## 2017-01-11 ENCOUNTER — Encounter
Admission: RE | Admit: 2017-01-11 | Discharge: 2017-01-11 | Disposition: A | Discharge status: Home / Self Care | Payer: Medicare Other | Source: Ambulatory Visit | Attending: Oncology | Admitting: Oncology

## 2017-01-11 ENCOUNTER — Ambulatory Visit
Admission: RE | Admit: 2017-01-11 | Discharge: 2017-01-11 | Disposition: A | Discharge status: Home / Self Care | Payer: Medicare Other | Source: Ambulatory Visit | Attending: Radiation Oncology | Admitting: Radiation Oncology

## 2017-01-11 ENCOUNTER — Telehealth: Payer: Self-pay | Admitting: *Deleted

## 2017-01-11 DIAGNOSIS — Z510 Encounter for antineoplastic radiation therapy: Secondary | ICD-10-CM | POA: Diagnosis not present

## 2017-01-11 DIAGNOSIS — C3492 Malignant neoplasm of unspecified part of left bronchus or lung: Secondary | ICD-10-CM | POA: Insufficient documentation

## 2017-01-11 LAB — GLUCOSE, CAPILLARY
GLUCOSE-CAPILLARY: 322 mg/dL — AB (ref 65–99)
Glucose-Capillary: 337 mg/dL — ABNORMAL HIGH (ref 65–99)

## 2017-01-11 NOTE — Telephone Encounter (Signed)
Will have to cancel PET until after XRT to brain completed per VO Lorretta Harp, NP. Jodell Cipro in [place] informed and J B said she will inform schedulers

## 2017-01-11 NOTE — Telephone Encounter (Signed)
PET called to report that they were unable to do PET this morning due to Glu 337 and has to be <200 to perform scan. She is on Dexamethasone and is diabetic. Asking how long she will be on Dex. Please advise

## 2017-01-12 ENCOUNTER — Ambulatory Visit
Admission: RE | Admit: 2017-01-12 | Discharge: 2017-01-12 | Disposition: A | Discharge status: Home / Self Care | Payer: Medicare Other | Source: Ambulatory Visit | Attending: Radiation Oncology | Admitting: Radiation Oncology

## 2017-01-12 DIAGNOSIS — Z510 Encounter for antineoplastic radiation therapy: Secondary | ICD-10-CM | POA: Diagnosis not present

## 2017-01-13 ENCOUNTER — Ambulatory Visit
Admission: RE | Admit: 2017-01-13 | Discharge: 2017-01-13 | Disposition: A | Discharge status: Home / Self Care | Payer: Medicare Other | Source: Ambulatory Visit | Attending: Radiation Oncology | Admitting: Radiation Oncology

## 2017-01-13 DIAGNOSIS — Z510 Encounter for antineoplastic radiation therapy: Secondary | ICD-10-CM | POA: Diagnosis not present

## 2017-01-14 ENCOUNTER — Ambulatory Visit
Admission: RE | Admit: 2017-01-14 | Discharge: 2017-01-14 | Disposition: A | Discharge status: Home / Self Care | Payer: Medicare Other | Source: Ambulatory Visit | Attending: Radiation Oncology | Admitting: Radiation Oncology

## 2017-01-14 DIAGNOSIS — Z510 Encounter for antineoplastic radiation therapy: Secondary | ICD-10-CM | POA: Diagnosis not present

## 2017-01-17 ENCOUNTER — Ambulatory Visit
Admission: RE | Admit: 2017-01-17 | Discharge: 2017-01-17 | Disposition: A | Discharge status: Home / Self Care | Payer: Medicare Other | Source: Ambulatory Visit | Attending: Radiation Oncology | Admitting: Radiation Oncology

## 2017-01-17 DIAGNOSIS — Z510 Encounter for antineoplastic radiation therapy: Secondary | ICD-10-CM | POA: Diagnosis not present

## 2017-01-18 ENCOUNTER — Ambulatory Visit
Admission: RE | Admit: 2017-01-18 | Discharge: 2017-01-18 | Disposition: A | Discharge status: Home / Self Care | Payer: Medicare Other | Source: Ambulatory Visit | Attending: Radiation Oncology | Admitting: Radiation Oncology

## 2017-01-18 DIAGNOSIS — C7931 Secondary malignant neoplasm of brain: Secondary | ICD-10-CM | POA: Insufficient documentation

## 2017-01-18 DIAGNOSIS — Z510 Encounter for antineoplastic radiation therapy: Secondary | ICD-10-CM | POA: Diagnosis not present

## 2017-01-18 NOTE — Progress Notes (Signed)
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 Now with multiple brain metastases.  INTERVAL HISTORY: Patient returns to clinic today for further evaluation and consideration of cycle 6 of Tecentriq. She was recently diagnosed with multiple brain metastasis and has initiated XRT. Patient reports she has 2 treatments left. She has worsening weakness and fatigue, but otherwise has felt well. She has no focal neurologic complaints. She denies any recent fevers or illnesses. She has a good appetite and denies weight loss. She has no chest pain or shortness of breath. She denies any nausea, vomiting, constipation, or diarrhea. She has no urinary complaints. Patient offers no further specific complaints today.   REVIEW OF SYSTEMS:   Review of Systems  Constitutional: Positive for malaise/fatigue. Negative for fever and weight loss.  Respiratory: Positive for cough. Negative for shortness of breath.   Cardiovascular: Negative.  Negative for chest pain and leg swelling.  Gastrointestinal: Negative.  Negative for abdominal pain, nausea and vomiting.  Genitourinary: Negative.   Musculoskeletal: Positive for falls and joint pain.  Skin: Negative.  Negative for rash.  Neurological: Positive for weakness. Negative for sensory change and focal weakness.  Psychiatric/Behavioral: Negative.  The patient is not nervous/anxious.    As per HPI. Otherwise, a complete review of systems is negative.   PAST MEDICAL HISTORY: Past Medical History:  Diagnosis Date  . Atrial fibrillation ([place])   . CHF (congestive heart failure) ([place])   . COPD (chronic obstructive pulmonary disease) ([place])   . Diabetes mellitus without  complication ([place])   . Hyperlipidemia   . Hypertension   . Hypothyroidism   . Lung cancer [facility]) 2015   Dr Merleen Nicely [facility], chemo and rad  . Metastatic cancer to brain ([place])   . Personal history of chemotherapy   . Personal history of radiation therapy   . Thyroid disease     PAST SURGICAL HISTORY: Past Surgical History:  Procedure Laterality Date  . COLONOSCOPY  2006   Dr Raynelle Fanning patient ?polyps  . COLONOSCOPY WITH PROPOFOL N/A 01/30/2015   Procedure: COLONOSCOPY WITH PROPOFOL;  Surgeon: Manya Silvas, MD;  Location: [facility] ENDOSCOPY;  Service: Endoscopy;  Laterality: N/A;  . ESOPHAGOGASTRODUODENOSCOPY (EGD) WITH PROPOFOL N/A 01/30/2015   Procedure: ESOPHAGOGASTRODUODENOSCOPY (EGD) WITH PROPOFOL;  Surgeon: Manya Silvas, MD;  Location: [facility] ENDOSCOPY;  Service: Endoscopy;  Laterality: N/A;  . JOINT REPLACEMENT    . Left knee surgery Left   . LUNG BIOPSY      FAMILY HISTORY Family History  Problem Relation Age of Onset  . Heart attack Mother   . Lung cancer Father   . Heart attack Son 24  . Breast cancer Neg Hx        ADVANCED DIRECTIVES:    HEALTH MAINTENANCE: Social History  Substance Use Topics  . Smoking status: Former Smoker    Quit date: 10/29/2013  . Smokeless tobacco: Never Used  . Alcohol use No     Colonoscopy:  PAP:  Bone density:  Lipid panel:  Allergies  Allergen Reactions  . Aleve [Naproxen Sodium]     Pt states can tolerate some, but causes GI upset  . Celebrex [Celecoxib] Other (See Comments)    Pt states can handle some, but causes GI upset  Current Outpatient Prescriptions  Medication Sig Dispense Refill  . atorvastatin (LIPITOR) 10 MG tablet Take 10 mg by mouth daily at 6 PM.     . carvedilol (COREG) 6.25 MG tablet Take 6.25 mg by mouth 2 (two) times daily with a meal.     . Cholecalciferol (VITAMIN D3) 2000 units capsule Take 2,000 Units by mouth daily.     Marland Kitchen dexamethasone (DECADRON) 4 MG tablet Take 1 tablet (4 mg total) by mouth 4  (four) times daily. 120 tablet 1  . diclofenac sodium (VOLTAREN) 1 % GEL Apply topically 4 (four) times daily as needed.     . empagliflozin (JARDIANCE) 10 MG TABS tablet Take 10 mg by mouth daily.    . furosemide (LASIX) 40 MG tablet Take 40 mg by mouth daily.     Marland Kitchen levothyroxine (SYNTHROID, LEVOTHROID) 50 MCG tablet Take 25 mcg by mouth daily.     Marland Kitchen losartan (COZAAR) 50 MG tablet Take 50 mg by mouth 2 (two) times daily.     . Magnesium 250 MG TABS Take 1 tablet by mouth daily.     . metFORMIN (GLUCOPHAGE-XR) 500 MG 24 hr tablet Take 1,000 mg by mouth 2 (two) times daily.     . pantoprazole (PROTONIX) 40 MG tablet Take 40 mg by mouth daily.     . traMADol (ULTRAM) 50 MG tablet Take 50 mg by mouth every 6 (six) hours as needed.     . warfarin (COUMADIN) 4 MG tablet Take 4 mg by mouth daily after supper.     . prochlorperazine (COMPAZINE) 10 MG tablet Take 10 mg by mouth every 6 (six) hours as needed.      No current facility-administered medications for this visit.     OBJECTIVE: Vitals:   01/21/17 1032  BP: 112/77  Pulse: 83  Resp: 18  Temp: (!) 97 F (36.1 C)     Body mass index is 32.12 kg/m.    ECOG FS:0 - Asymptomatic  General: Well-developed, well-nourished, no acute distress. Eyes: Pink conjunctiva, anicteric sclera. Lungs: Clear to auscultation bilaterally. Heart: Regular rate and rhythm. No rubs, murmurs, or gallops. Abdomen: Soft, nontender, nondistended. No organomegaly noted, normoactive bowel sounds. Musculoskeletal: No edema, cyanosis, or clubbing. Neuro: Alert, answering all questions appropriately. Cranial nerves grossly intact. Skin: No rashes or petechiae noted. Psych: Normal affect.   LAB RESULTS:  Lab Results  Component Value Date   NA 136 01/21/2017   K 3.4 (L) 01/21/2017   CL 95 (L) 01/21/2017   CO2 28 01/21/2017   GLUCOSE 354 (H) 01/21/2017   BUN 33 (H) 01/21/2017   CREATININE 0.82 01/21/2017   CALCIUM 8.0 (L) 01/21/2017   PROT 5.7 (L)  01/21/2017   ALBUMIN 2.6 (L) 01/21/2017   AST 24 01/21/2017   ALT 27 01/21/2017   ALKPHOS 51 01/21/2017   BILITOT 0.7 01/21/2017   GFRNONAA >60 01/21/2017   GFRAA >60 01/21/2017    Lab Results  Component Value Date   WBC 9.5 01/21/2017   NEUTROABS 8.7 (H) 01/21/2017   HGB 11.7 (L) 01/21/2017   HCT 36.8 01/21/2017   MCV 83.0 01/21/2017   PLT 123 (L) 01/21/2017    STUDIES: Mr Jeri Cos OF Contrast  Result Date: 12/27/2016 CLINICAL DATA:  Metastatic lung cancer EXAM: MRI HEAD WITHOUT AND WITH CONTRAST TECHNIQUE: Multiplanar, multiecho pulse sequences of the brain and surrounding structures were obtained without and with intravenous contrast. CONTRAST:  17 mL MultiHance IV COMPARISON:  CT head 07/22/2012 FINDINGS: Brain:  Multiple enhancing mass lesions in the brain compatible with widespread metastatic disease. 8 mm lesion right posterior temporal lobe 5.6 mm posterior left temporal lobe 12 mm right inferior frontal lesion with surrounding edema 9 mm right parietal lesion with surrounding edema 11 mm enhancing mass right choroid plexus 3 mm lesion left occipital lobe 5 mm right frontal lesion 9 mm left medial parietal lobe with surrounding edema 12 mm necrotic lesion left posterior parietal lobe with surrounding edema 10 mm right parietal lesion 19 mm necrotic lesion right frontal lobe with surrounding edema 2 small lesions in the left parietal lobe over the convexity Generalized atrophy. Extensive brain edema in both cerebral hemispheres without midline shift. No lesions in the posterior fossa. Left posterior parietal lesion shows mild hemorrhage. Negative for acute infarct. 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 There is extensive cerebral edema without midline shift. Intra tumoral hemorrhage left parietal lobe. These results will be  called to the ordering clinician or representative by the Radiologist Assistant, and communication documented in the PACS or zVision Dashboard. Electronically Signed   By: Franchot Gallo M.D.   On: 12/27/2016 11:43    ONCOLOGIC HISTORY:   Patient was initially diagnosed with a 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 PET scan on August 25, 2012 revealed a hypermetabolic nodule 2.1 cm in the left apices. Bronchoscopy on August 31, 2012 did not show any evidence of malignancy, but CT-guided biopsy on September 18, 2012 confirmed squamous cell carcinoma. Patient was determined not to be a surgical candidate at that time and underwent SBRT in May 2014. CT scan on April 23, 2013 revealed an enlarging prevascular lymph node. PET scan on June 04, 2013 revealed a new enlarging left lobe paratracheal lymph node with no other evidence of metastatic disease. Subsequent EBUS on June 13, 2013 confirmed recurrent squamous cell carcinoma. Patient underwent XRT along with concurrent navelbine between July 16, 2013 - August 28, 2013. Patient was then disease-free until CT scan on Sep 30, 2015 revealed an interval increase of a nodule measuring up to 2.3 cm in the left lower lobe. PET scan on Oct 12, 5100 was hypermetabolic in the left upper, left lower and right middle lobe nodules concerning for neoplasm. Patient completed 4 cycles of dose reduced carboplatinum and gemcitabine between July 2017 and September 2017. Patient initiated [place] on Oct 08, 2016.  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  PLAN:    1. 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 CT scan results from September 15, 2016 reviewed independently with clear progression of disease. After lengthy discussion with the patient, she  has agreed to reinitiate treatment with immunotherapy. Patient continues to refuse port placement. Will give Tecentriq every 3 weeks and re-image after ~6 cycles. Proceed with cycle 6 of Tecentriq today. Return to clinic in 3 weeks for consideration of cycle 7. Patient will require a tapering of her dexamethasone over the past 1-2 weeks and then we will get a restaging PET scan. 2. Hypertension: Patient's blood pressure is within normal limits today. Continue current medications. Patient admits noncompliance to taking her blood pressure medications.  3. Hypokalemia: Mild. Patient was previously given dietary recommendations.  4. Anemia:  Mild, monitor. 5. Aortic aneurysm: Previously evaluated in the past at [facility]. CT scan results indicate stability of her aneurysm. Patient reports surgery was recommended previously, but she declined. She indicated she will also will likely decline any interventions at this time again. Previously a referral was made, back to her cardiothoracic surgeon, but patient canceled the appointment. 6. Cough: Have recommended OTC treatments. Consider chest x-ray in the future. 7. Brain metastasis: MRI results reviewed independently and reported as above. Complete XRT in the next several days and then begin to taper her dexamethasone so that PET scan can be obtained.   Patient expressed understanding and was in agreement with this plan. She also understands that She can call clinic at any time with any questions, concerns, or complaints.    Lloyd Huger, MD   01/21/2017 1:20 PM

## 2017-01-19 ENCOUNTER — Inpatient Hospital Stay: Payer: Medicare Other

## 2017-01-19 ENCOUNTER — Ambulatory Visit
Admission: RE | Admit: 2017-01-19 | Discharge: 2017-01-19 | Disposition: A | Discharge status: Home / Self Care | Payer: Medicare Other | Source: Ambulatory Visit | Attending: Radiation Oncology | Admitting: Radiation Oncology

## 2017-01-19 ENCOUNTER — Other Ambulatory Visit: Payer: Medicare Other

## 2017-01-19 DIAGNOSIS — Z510 Encounter for antineoplastic radiation therapy: Secondary | ICD-10-CM | POA: Diagnosis not present

## 2017-01-20 ENCOUNTER — Other Ambulatory Visit: Payer: Medicare Other

## 2017-01-20 ENCOUNTER — Ambulatory Visit
Admission: RE | Admit: 2017-01-20 | Discharge: 2017-01-20 | Disposition: A | Discharge status: Home / Self Care | Payer: Medicare Other | Source: Ambulatory Visit | Attending: Radiation Oncology | Admitting: Radiation Oncology

## 2017-01-20 DIAGNOSIS — Z510 Encounter for antineoplastic radiation therapy: Secondary | ICD-10-CM | POA: Diagnosis not present

## 2017-01-21 ENCOUNTER — Inpatient Hospital Stay: Payer: Medicare Other

## 2017-01-21 ENCOUNTER — Ambulatory Visit
Admission: RE | Admit: 2017-01-21 | Discharge: 2017-01-21 | Disposition: A | Discharge status: Home / Self Care | Payer: Medicare Other | Source: Ambulatory Visit | Attending: Radiation Oncology | Admitting: Radiation Oncology

## 2017-01-21 ENCOUNTER — Encounter: Payer: Self-pay | Admitting: Oncology

## 2017-01-21 ENCOUNTER — Inpatient Hospital Stay (HOSPITAL_BASED_OUTPATIENT_CLINIC_OR_DEPARTMENT_OTHER): Payer: Medicare Other | Admitting: Oncology

## 2017-01-21 VITALS — BP 112/77 | HR 83 | Temp 97.0°F | Resp 18 | Ht 63.0 in | Wt 181.3 lb

## 2017-01-21 DIAGNOSIS — R5383 Other fatigue: Secondary | ICD-10-CM

## 2017-01-21 DIAGNOSIS — Z9119 Patient's noncompliance with other medical treatment and regimen: Secondary | ICD-10-CM

## 2017-01-21 DIAGNOSIS — Z801 Family history of malignant neoplasm of trachea, bronchus and lung: Secondary | ICD-10-CM

## 2017-01-21 DIAGNOSIS — Z79899 Other long term (current) drug therapy: Secondary | ICD-10-CM

## 2017-01-21 DIAGNOSIS — Z7984 Long term (current) use of oral hypoglycemic drugs: Secondary | ICD-10-CM

## 2017-01-21 DIAGNOSIS — C7801 Secondary malignant neoplasm of right lung: Secondary | ICD-10-CM

## 2017-01-21 DIAGNOSIS — C3492 Malignant neoplasm of unspecified part of left bronchus or lung: Secondary | ICD-10-CM

## 2017-01-21 DIAGNOSIS — C7802 Secondary malignant neoplasm of left lung: Secondary | ICD-10-CM

## 2017-01-21 DIAGNOSIS — Z923 Personal history of irradiation: Secondary | ICD-10-CM

## 2017-01-21 DIAGNOSIS — E119 Type 2 diabetes mellitus without complications: Secondary | ICD-10-CM

## 2017-01-21 DIAGNOSIS — G936 Cerebral edema: Secondary | ICD-10-CM

## 2017-01-21 DIAGNOSIS — C7931 Secondary malignant neoplasm of brain: Secondary | ICD-10-CM

## 2017-01-21 DIAGNOSIS — D649 Anemia, unspecified: Secondary | ICD-10-CM | POA: Diagnosis not present

## 2017-01-21 DIAGNOSIS — I4891 Unspecified atrial fibrillation: Secondary | ICD-10-CM

## 2017-01-21 DIAGNOSIS — I714 Abdominal aortic aneurysm, without rupture: Secondary | ICD-10-CM

## 2017-01-21 DIAGNOSIS — R531 Weakness: Secondary | ICD-10-CM

## 2017-01-21 DIAGNOSIS — E785 Hyperlipidemia, unspecified: Secondary | ICD-10-CM

## 2017-01-21 DIAGNOSIS — E039 Hypothyroidism, unspecified: Secondary | ICD-10-CM

## 2017-01-21 DIAGNOSIS — Z87891 Personal history of nicotine dependence: Secondary | ICD-10-CM

## 2017-01-21 DIAGNOSIS — E876 Hypokalemia: Secondary | ICD-10-CM

## 2017-01-21 DIAGNOSIS — Z7901 Long term (current) use of anticoagulants: Secondary | ICD-10-CM

## 2017-01-21 DIAGNOSIS — Z9221 Personal history of antineoplastic chemotherapy: Secondary | ICD-10-CM

## 2017-01-21 DIAGNOSIS — R0602 Shortness of breath: Secondary | ICD-10-CM | POA: Diagnosis not present

## 2017-01-21 DIAGNOSIS — I10 Essential (primary) hypertension: Secondary | ICD-10-CM | POA: Diagnosis not present

## 2017-01-21 DIAGNOSIS — I509 Heart failure, unspecified: Secondary | ICD-10-CM

## 2017-01-21 DIAGNOSIS — Z510 Encounter for antineoplastic radiation therapy: Secondary | ICD-10-CM | POA: Diagnosis not present

## 2017-01-21 DIAGNOSIS — J449 Chronic obstructive pulmonary disease, unspecified: Secondary | ICD-10-CM

## 2017-01-21 LAB — COMPREHENSIVE METABOLIC PANEL
ALT: 27 U/L (ref 14–54)
AST: 24 U/L (ref 15–41)
Albumin: 2.6 g/dL — ABNORMAL LOW (ref 3.5–5.0)
Alkaline Phosphatase: 51 U/L (ref 38–126)
Anion gap: 13 (ref 5–15)
BUN: 33 mg/dL — AB (ref 6–20)
CHLORIDE: 95 mmol/L — AB (ref 101–111)
CO2: 28 mmol/L (ref 22–32)
Calcium: 8 mg/dL — ABNORMAL LOW (ref 8.9–10.3)
Creatinine, Ser: 0.82 mg/dL (ref 0.44–1.00)
Glucose, Bld: 354 mg/dL — ABNORMAL HIGH (ref 65–99)
POTASSIUM: 3.4 mmol/L — AB (ref 3.5–5.1)
SODIUM: 136 mmol/L (ref 135–145)
Total Bilirubin: 0.7 mg/dL (ref 0.3–1.2)
Total Protein: 5.7 g/dL — ABNORMAL LOW (ref 6.5–8.1)

## 2017-01-21 LAB — CBC WITH DIFFERENTIAL/PLATELET
BASOS ABS: 0.1 10*3/uL (ref 0–0.1)
Basophils Relative: 1 %
EOS ABS: 0 10*3/uL (ref 0–0.7)
EOS PCT: 0 %
HCT: 36.8 % (ref 35.0–47.0)
Hemoglobin: 11.7 g/dL — ABNORMAL LOW (ref 12.0–16.0)
LYMPHS PCT: 3 %
Lymphs Abs: 0.2 10*3/uL — ABNORMAL LOW (ref 1.0–3.6)
MCH: 26.4 pg (ref 26.0–34.0)
MCHC: 31.8 g/dL — ABNORMAL LOW (ref 32.0–36.0)
MCV: 83 fL (ref 80.0–100.0)
MONO ABS: 0.4 10*3/uL (ref 0.2–0.9)
Monocytes Relative: 5 %
Neutro Abs: 8.7 10*3/uL — ABNORMAL HIGH (ref 1.4–6.5)
Neutrophils Relative %: 91 %
PLATELETS: 123 10*3/uL — AB (ref 150–440)
RBC: 4.43 MIL/uL (ref 3.80–5.20)
RDW: 24.4 % — AB (ref 11.5–14.5)
WBC: 9.5 10*3/uL (ref 3.6–11.0)

## 2017-01-21 MED ORDER — SODIUM CHLORIDE 0.9 % IV SOLN
1200.0000 mg | Freq: Once | INTRAVENOUS | Status: AC
  Administered 2017-01-21: 1200 mg via INTRAVENOUS
  Filled 2017-01-21: qty 20

## 2017-01-21 MED ORDER — SODIUM CHLORIDE 0.9 % IV SOLN
Freq: Once | INTRAVENOUS | Status: AC
  Administered 2017-01-21: 12:00:00 via INTRAVENOUS
  Filled 2017-01-21: qty 1000

## 2017-01-21 NOTE — Progress Notes (Signed)
Pt states that since she has been on this treatment, she feels like her legs are stiff. She had injection in right knee and feels some better. Her left leg she has to take her hand and help her left leg to get back on the wheelchair.

## 2017-01-24 ENCOUNTER — Ambulatory Visit
Admission: RE | Admit: 2017-01-24 | Discharge: 2017-01-24 | Disposition: A | Discharge status: Home / Self Care | Payer: Medicare Other | Source: Ambulatory Visit | Attending: Radiation Oncology | Admitting: Radiation Oncology

## 2017-01-24 DIAGNOSIS — Z510 Encounter for antineoplastic radiation therapy: Secondary | ICD-10-CM | POA: Diagnosis not present

## 2017-02-04 ENCOUNTER — Ambulatory Visit
Admission: RE | Admit: 2017-02-04 | Discharge: 2017-02-04 | Disposition: A | Discharge status: Home / Self Care | Payer: Medicare Other | Source: Ambulatory Visit | Attending: Oncology | Admitting: Oncology

## 2017-02-04 ENCOUNTER — Telehealth: Payer: Self-pay | Admitting: *Deleted

## 2017-02-04 DIAGNOSIS — C3492 Malignant neoplasm of unspecified part of left bronchus or lung: Secondary | ICD-10-CM

## 2017-02-04 DIAGNOSIS — C7931 Secondary malignant neoplasm of brain: Secondary | ICD-10-CM | POA: Diagnosis present

## 2017-02-04 LAB — GLUCOSE, CAPILLARY: Glucose-Capillary: 268 mg/dL — ABNORMAL HIGH (ref 65–99)

## 2017-02-04 NOTE — Telephone Encounter (Signed)
PET unable to be done again today due to glucose being 268, 3 weeks ago it was not done for a glucose of 320. This has not been rescheduled at this time asking if she needs medicine adjustments. Please advise

## 2017-02-04 NOTE — Telephone Encounter (Signed)
Dr Tonette Bihari office will call her to offer appointment on Monday to be seen

## 2017-02-04 NOTE — Telephone Encounter (Signed)
Patient's diabetes is being managed by her pcp. Please ensure she has f/u with them asap.

## 2017-02-09 NOTE — Progress Notes (Signed)
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  INTERVAL HISTORY: Patient returns to clinic today for further evaluation and consideration of cycle 7 of Tecentriq. She was recently diagnosed with multiple brain metastasis and has now completed XRT. Her PET scan was not done secondary to hypoglycemia. She currently feels well. She has no focal neurologic complaints. She denies any recent fevers or illnesses. She has a good appetite and denies weight loss. She has no chest pain or shortness of breath. She denies any nausea, vomiting, constipation, or diarrhea. She has no urinary complaints. Patient offers no further specific complaints today.   REVIEW OF SYSTEMS:   Review of Systems  Constitutional: Negative for fever, malaise/fatigue and weight loss.  Respiratory: Positive for cough. Negative for shortness of breath.   Cardiovascular: Negative.  Negative for chest pain and leg swelling.  Gastrointestinal: Negative.  Negative for abdominal pain, nausea and vomiting.  Genitourinary: Negative.   Musculoskeletal: Positive for joint pain. Negative for falls.  Skin: Negative.  Negative for rash.  Neurological: Negative for sensory change, focal weakness and weakness.  Psychiatric/Behavioral: Negative.  The patient is not nervous/anxious.    As per HPI. Otherwise, a complete review of systems is negative.   PAST MEDICAL HISTORY: Past Medical History:  Diagnosis Date  . Atrial fibrillation ([place])   . CHF (congestive heart failure) ([place])   . COPD (chronic obstructive pulmonary disease) ([place])   . Diabetes mellitus without complication ([place])   . Hyperlipidemia   .  Hypertension   . Hypothyroidism   . Lung cancer [facility]) 2015   Dr Merleen Nicely [facility], chemo and rad  . Metastatic cancer to brain ([place])   . Personal history of chemotherapy   . Personal history of radiation therapy   . Thyroid disease     PAST SURGICAL HISTORY: Past Surgical History:  Procedure Laterality Date  . COLONOSCOPY  2006   Dr Raynelle Fanning patient ?polyps  . COLONOSCOPY WITH PROPOFOL N/A 01/30/2015   Procedure: COLONOSCOPY WITH PROPOFOL;  Surgeon: Manya Silvas, MD;  Location: [facility] ENDOSCOPY;  Service: Endoscopy;  Laterality: N/A;  . ESOPHAGOGASTRODUODENOSCOPY (EGD) WITH PROPOFOL N/A 01/30/2015   Procedure: ESOPHAGOGASTRODUODENOSCOPY (EGD) WITH PROPOFOL;  Surgeon: Manya Silvas, MD;  Location: [facility] ENDOSCOPY;  Service: Endoscopy;  Laterality: N/A;  . JOINT REPLACEMENT    . Left knee surgery Left   . LUNG BIOPSY      FAMILY HISTORY Family History  Problem Relation Age of Onset  . Heart attack Mother   . Lung cancer Father   . Heart attack Son 82  . Breast cancer Neg Hx        ADVANCED DIRECTIVES:    HEALTH MAINTENANCE: Social History  Substance Use Topics  . Smoking status: Former Smoker    Quit date: 10/29/2013  . Smokeless tobacco: Never Used  . Alcohol use No     Colonoscopy:  PAP:  Bone density:  Lipid panel:  Allergies  Allergen Reactions  . Aleve [Naproxen Sodium]     Pt states can tolerate some, but causes GI upset  . Celebrex [Celecoxib] Other (See Comments)    Pt states can handle some, but causes GI upset    Current Outpatient Prescriptions  Medication  Sig Dispense Refill  . atorvastatin (LIPITOR) 10 MG tablet Take 10 mg by mouth daily at 6 PM.     . carvedilol (COREG) 6.25 MG tablet Take 6.25 mg by mouth 2 (two) times daily with a meal.     . Cholecalciferol (VITAMIN D3) 2000 units capsule Take 2,000 Units by mouth daily.     . diclofenac sodium (VOLTAREN) 1 % GEL Apply topically 4 (four) times daily as needed.     . empagliflozin (JARDIANCE)  10 MG TABS tablet Take 10 mg by mouth daily.    . furosemide (LASIX) 40 MG tablet Take 40 mg by mouth daily.     Marland Kitchen levothyroxine (SYNTHROID, LEVOTHROID) 50 MCG tablet Take 25 mcg by mouth daily.     Marland Kitchen losartan (COZAAR) 50 MG tablet Take 50 mg by mouth 2 (two) times daily.     . Magnesium 250 MG TABS Take 1 tablet by mouth daily.     . metFORMIN (GLUCOPHAGE-XR) 500 MG 24 hr tablet Take 1,000 mg by mouth 2 (two) times daily.     . pantoprazole (PROTONIX) 40 MG tablet Take 40 mg by mouth daily.     . prochlorperazine (COMPAZINE) 10 MG tablet Take 10 mg by mouth every 6 (six) hours as needed.     . traMADol (ULTRAM) 50 MG tablet Take 50 mg by mouth every 6 (six) hours as needed.     . warfarin (COUMADIN) 4 MG tablet Take 4 mg by mouth daily after supper.     . dexamethasone (DECADRON) 4 MG tablet Take 1 tablet (4 mg total) by mouth 4 (four) times daily. (Patient not taking: Reported on 02/11/2017) 120 tablet 1   No current facility-administered medications for this visit.     OBJECTIVE: Vitals:   02/11/17 1025  BP: (!) 145/77  Pulse: (!) 18  Temp: (!) 97.5 F (36.4 C)     There is no height or weight on file to calculate BMI.    ECOG FS:0 - Asymptomatic  General: Well-developed, well-nourished, no acute distress. Eyes: Pink conjunctiva, anicteric sclera. Lungs: Clear to auscultation bilaterally. Heart: Regular rate and rhythm. No rubs, murmurs, or gallops. Abdomen: Soft, nontender, nondistended. No organomegaly noted, normoactive bowel sounds. Musculoskeletal: No edema, cyanosis, or clubbing. Neuro: Alert, answering all questions appropriately. Cranial nerves grossly intact. Skin: No rashes or petechiae noted. Psych: Normal affect.   LAB RESULTS:  Lab Results  Component Value Date   NA 140 02/11/2017   K 3.1 (L) 02/11/2017   CL 99 (L) 02/11/2017   CO2 32 02/11/2017   GLUCOSE 190 (H) 02/11/2017   BUN 17 02/11/2017   CREATININE 0.79 02/11/2017   CALCIUM 7.7 (L) 02/11/2017    PROT 6.2 (L) 02/11/2017   ALBUMIN 2.3 (L) 02/11/2017   AST 20 02/11/2017   ALT 15 02/11/2017   ALKPHOS 65 02/11/2017   BILITOT 0.6 02/11/2017   GFRNONAA >60 02/11/2017   GFRAA >60 02/11/2017    Lab Results  Component Value Date   WBC 6.3 02/11/2017   NEUTROABS 4.6 02/11/2017   HGB 10.5 (L) 02/11/2017   HCT 32.3 (L) 02/11/2017   MCV 84.2 02/11/2017   PLT 369 02/11/2017    STUDIES: No results found.  ONCOLOGIC HISTORY:   Patient was initially diagnosed with a 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 PET scan on August 25, 2012 revealed a hypermetabolic nodule 2.1 cm in the left apices. Bronchoscopy on August 31, 2012 did not show any evidence of  malignancy, but CT-guided biopsy on September 18, 2012 confirmed squamous cell carcinoma. Patient was determined not to be a surgical candidate at that time and underwent SBRT in May 2014. CT scan on April 23, 2013 revealed an enlarging prevascular lymph node. PET scan on June 04, 2013 revealed a new enlarging left lobe paratracheal lymph node with no other evidence of metastatic disease. Subsequent EBUS on June 13, 2013 confirmed recurrent squamous cell carcinoma. Patient underwent XRT along with concurrent navelbine between July 16, 2013 - August 28, 2013. Patient was then disease-free until CT scan on Sep 30, 2015 revealed an interval increase of a nodule measuring up to 2.3 cm in the left lower lobe. PET scan on Oct 13, 8914 was hypermetabolic in the left upper, left lower and right middle lobe nodules concerning for neoplasm. Patient completed 4 cycles of dose reduced carboplatinum and gemcitabine between July 2017 and September 2017. Patient initiated [place] on Oct 08, 2016.  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  PLAN:    1. 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 Now  with multiple brain metastases: See oncologic history as above. CT scan results from September 15, 2016 reviewed independently with clear progression of disease. Patient continues to refuse port placement. Plan was to proceed with cycle 7 of Tecentriq today, the patient has declined treatment. Will reschedule PET scan for early next week and then patient will return to clinic in 1 week to discuss the results as well as reconsideration of cycle 7. 2. Hypertension: Patient's blood pressure is mildly elevated today. Continue current medications. Patient admits noncompliance to taking her blood pressure medications.  3. Hypokalemia: Mild. Patient was previously given dietary recommendations.  4. Anemia: Mild, monitor. 5. Aortic aneurysm: Previously evaluated in the past at [facility]. CT scan results indicate stability of her aneurysm. Patient reports surgery was recommended previously, but she declined. She indicated she will also will likely decline any interventions at this time again. Previously a referral was made, back to her cardiothoracic surgeon, but patient canceled the appointment. 6. Cough: Have recommended OTC treatments. Consider chest x-ray in the future. 7. Brain metastasis: MRI results reviewed independently. Patient has now completed XRT and has tapered off her dexamethasone. 8. Hyperglycemia: Elevated, but improved. Reschedule PET scan for next week as above.   Patient expressed understanding and was in agreement with this plan. She also understands that She can call clinic at any time with any questions, concerns, or complaints.    Lloyd Huger, MD   02/11/2017 4:11 PM

## 2017-02-11 ENCOUNTER — Inpatient Hospital Stay: Payer: Medicare Other | Attending: Oncology | Admitting: Oncology

## 2017-02-11 ENCOUNTER — Ambulatory Visit: Payer: Medicare Other

## 2017-02-11 ENCOUNTER — Inpatient Hospital Stay: Payer: Medicare Other

## 2017-02-11 VITALS — BP 145/77 | HR 18 | Temp 97.5°F

## 2017-02-11 DIAGNOSIS — C7801 Secondary malignant neoplasm of right lung: Secondary | ICD-10-CM | POA: Diagnosis not present

## 2017-02-11 DIAGNOSIS — R531 Weakness: Secondary | ICD-10-CM | POA: Insufficient documentation

## 2017-02-11 DIAGNOSIS — Z923 Personal history of irradiation: Secondary | ICD-10-CM | POA: Insufficient documentation

## 2017-02-11 DIAGNOSIS — Z9221 Personal history of antineoplastic chemotherapy: Secondary | ICD-10-CM | POA: Insufficient documentation

## 2017-02-11 DIAGNOSIS — Z87891 Personal history of nicotine dependence: Secondary | ICD-10-CM | POA: Diagnosis not present

## 2017-02-11 DIAGNOSIS — Z79899 Other long term (current) drug therapy: Secondary | ICD-10-CM

## 2017-02-11 DIAGNOSIS — C3492 Malignant neoplasm of unspecified part of left bronchus or lung: Secondary | ICD-10-CM | POA: Diagnosis present

## 2017-02-11 DIAGNOSIS — Z7901 Long term (current) use of anticoagulants: Secondary | ICD-10-CM | POA: Diagnosis not present

## 2017-02-11 DIAGNOSIS — E1165 Type 2 diabetes mellitus with hyperglycemia: Secondary | ICD-10-CM | POA: Diagnosis not present

## 2017-02-11 DIAGNOSIS — D7389 Other diseases of spleen: Secondary | ICD-10-CM | POA: Insufficient documentation

## 2017-02-11 DIAGNOSIS — E039 Hypothyroidism, unspecified: Secondary | ICD-10-CM | POA: Insufficient documentation

## 2017-02-11 DIAGNOSIS — Z7984 Long term (current) use of oral hypoglycemic drugs: Secondary | ICD-10-CM | POA: Insufficient documentation

## 2017-02-11 DIAGNOSIS — I10 Essential (primary) hypertension: Secondary | ICD-10-CM | POA: Diagnosis not present

## 2017-02-11 DIAGNOSIS — E785 Hyperlipidemia, unspecified: Secondary | ICD-10-CM

## 2017-02-11 DIAGNOSIS — J449 Chronic obstructive pulmonary disease, unspecified: Secondary | ICD-10-CM | POA: Diagnosis not present

## 2017-02-11 DIAGNOSIS — R05 Cough: Secondary | ICD-10-CM | POA: Diagnosis not present

## 2017-02-11 DIAGNOSIS — E876 Hypokalemia: Secondary | ICD-10-CM | POA: Diagnosis not present

## 2017-02-11 DIAGNOSIS — I509 Heart failure, unspecified: Secondary | ICD-10-CM | POA: Diagnosis not present

## 2017-02-11 DIAGNOSIS — D649 Anemia, unspecified: Secondary | ICD-10-CM | POA: Insufficient documentation

## 2017-02-11 DIAGNOSIS — C7931 Secondary malignant neoplasm of brain: Secondary | ICD-10-CM | POA: Diagnosis not present

## 2017-02-11 DIAGNOSIS — I719 Aortic aneurysm of unspecified site, without rupture: Secondary | ICD-10-CM | POA: Diagnosis not present

## 2017-02-11 DIAGNOSIS — I4891 Unspecified atrial fibrillation: Secondary | ICD-10-CM | POA: Diagnosis not present

## 2017-02-11 DIAGNOSIS — Z801 Family history of malignant neoplasm of trachea, bronchus and lung: Secondary | ICD-10-CM | POA: Insufficient documentation

## 2017-02-11 DIAGNOSIS — R5383 Other fatigue: Secondary | ICD-10-CM | POA: Insufficient documentation

## 2017-02-11 DIAGNOSIS — C7800 Secondary malignant neoplasm of unspecified lung: Secondary | ICD-10-CM | POA: Insufficient documentation

## 2017-02-11 LAB — COMPREHENSIVE METABOLIC PANEL
ALK PHOS: 65 U/L (ref 38–126)
ALT: 15 U/L (ref 14–54)
ANION GAP: 9 (ref 5–15)
AST: 20 U/L (ref 15–41)
Albumin: 2.3 g/dL — ABNORMAL LOW (ref 3.5–5.0)
BILIRUBIN TOTAL: 0.6 mg/dL (ref 0.3–1.2)
BUN: 17 mg/dL (ref 6–20)
CALCIUM: 7.7 mg/dL — AB (ref 8.9–10.3)
CO2: 32 mmol/L (ref 22–32)
Chloride: 99 mmol/L — ABNORMAL LOW (ref 101–111)
Creatinine, Ser: 0.79 mg/dL (ref 0.44–1.00)
GFR calc Af Amer: >60 mL/min (ref >60)
GFR calc non Af Amer: >60 mL/min (ref >60)
GLUCOSE: 190 mg/dL — AB (ref 65–99)
Potassium: 3.1 mmol/L — ABNORMAL LOW (ref 3.5–5.1)
Sodium: 140 mmol/L (ref 135–145)
TOTAL PROTEIN: 6.2 g/dL — AB (ref 6.5–8.1)

## 2017-02-11 LAB — CBC WITH DIFFERENTIAL/PLATELET
Basophils Absolute: 0.1 10*3/uL (ref 0–0.1)
Basophils Relative: 1 %
EOS ABS: 0.1 10*3/uL (ref 0–0.7)
EOS PCT: 1 %
HEMATOCRIT: 32.3 % — AB (ref 35.0–47.0)
Hemoglobin: 10.5 g/dL — ABNORMAL LOW (ref 12.0–16.0)
LYMPHS PCT: 10 %
Lymphs Abs: 0.6 10*3/uL — ABNORMAL LOW (ref 1.0–3.6)
MCH: 27.3 pg (ref 26.0–34.0)
MCHC: 32.4 g/dL (ref 32.0–36.0)
MCV: 84.2 fL (ref 80.0–100.0)
MONOS PCT: 14 %
Monocytes Absolute: 0.9 10*3/uL (ref 0.2–0.9)
NEUTROS PCT: 74 %
Neutro Abs: 4.6 10*3/uL (ref 1.4–6.5)
Platelets: 369 10*3/uL (ref 150–440)
RBC: 3.84 MIL/uL (ref 3.80–5.20)
RDW: 24.8 % — ABNORMAL HIGH (ref 11.5–14.5)
WBC: 6.3 10*3/uL (ref 3.6–11.0)

## 2017-02-11 NOTE — Progress Notes (Signed)
Patient denies any concerns today.  

## 2017-02-15 ENCOUNTER — Encounter
Admission: RE | Admit: 2017-02-15 | Discharge: 2017-02-15 | Disposition: A | Discharge status: Home / Self Care | Payer: Medicare Other | Source: Ambulatory Visit | Attending: Oncology | Admitting: Oncology

## 2017-02-15 DIAGNOSIS — C3492 Malignant neoplasm of unspecified part of left bronchus or lung: Secondary | ICD-10-CM | POA: Insufficient documentation

## 2017-02-15 DIAGNOSIS — C7931 Secondary malignant neoplasm of brain: Secondary | ICD-10-CM | POA: Diagnosis present

## 2017-02-15 LAB — GLUCOSE, CAPILLARY: Glucose-Capillary: 135 mg/dL — ABNORMAL HIGH (ref 65–99)

## 2017-02-15 MED ORDER — FLUDEOXYGLUCOSE F - 18 (FDG) INJECTION
12.0000 | Freq: Once | INTRAVENOUS | Status: AC | PRN
  Administered 2017-02-15: 12.72 via INTRAVENOUS

## 2017-02-15 NOTE — Progress Notes (Signed)
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  INTERVAL HISTORY: Patient returns to clinic today for further evaluation, discussion of her imaging results, and reconsideration of cycle 7 of Tecentriq. She was recently diagnosed with multiple brain metastasis and has now completed XRT. She currently feels well, although admits to increased weakness and fatigue. She has no focal neurologic complaints. She denies any recent fevers or illnesses. She has a good appetite and denies weight loss. She has no chest pain or shortness of breath. She denies any nausea, vomiting, constipation, or diarrhea. She has no urinary complaints. Patient offers no further specific complaints today.   REVIEW OF SYSTEMS:   Review of Systems  Constitutional: Positive for malaise/fatigue. Negative for fever and weight loss.  Respiratory: Positive for cough. Negative for shortness of breath.   Cardiovascular: Negative.  Negative for chest pain and leg swelling.  Gastrointestinal: Negative.  Negative for abdominal pain, nausea and vomiting.  Genitourinary: Negative.   Musculoskeletal: Positive for joint pain. Negative for falls.  Skin: Negative.  Negative for rash.  Neurological: Positive for weakness. Negative for sensory change and focal weakness.  Psychiatric/Behavioral: Negative.  The patient is not nervous/anxious.    As per HPI. Otherwise, a complete review of systems is negative.   PAST MEDICAL HISTORY: Past Medical History:  Diagnosis Date  . Atrial fibrillation ([place])   . CHF (congestive heart failure) ([place])   . COPD (chronic obstructive pulmonary disease) ([place])   . Diabetes  mellitus without complication ([place])   . Hyperlipidemia   . Hypertension   . Hypothyroidism   . Lung cancer [facility]) 2015   Dr Merleen Nicely [facility], chemo and rad  . Metastatic cancer to brain ([place])   . Personal history of chemotherapy   . Personal history of radiation therapy   . Thyroid disease     PAST SURGICAL HISTORY: Past Surgical History:  Procedure Laterality Date  . COLONOSCOPY  2006   Dr Raynelle Fanning patient ?polyps  . COLONOSCOPY WITH PROPOFOL N/A 01/30/2015   Procedure: COLONOSCOPY WITH PROPOFOL;  Surgeon: Manya Silvas, MD;  Location: [facility] ENDOSCOPY;  Service: Endoscopy;  Laterality: N/A;  . ESOPHAGOGASTRODUODENOSCOPY (EGD) WITH PROPOFOL N/A 01/30/2015   Procedure: ESOPHAGOGASTRODUODENOSCOPY (EGD) WITH PROPOFOL;  Surgeon: Manya Silvas, MD;  Location: [facility] ENDOSCOPY;  Service: Endoscopy;  Laterality: N/A;  . JOINT REPLACEMENT    . Left knee surgery Left   . LUNG BIOPSY      FAMILY HISTORY Family History  Problem Relation Age of Onset  . Heart attack Mother   . Lung cancer Father   . Heart attack Son 95  . Breast cancer Neg Hx        ADVANCED DIRECTIVES:    HEALTH MAINTENANCE: Social History  Substance Use Topics  . Smoking status: Former Smoker    Quit date: 10/29/2013  . Smokeless tobacco: Never Used  . Alcohol use No     Colonoscopy:  PAP:  Bone density:  Lipid panel:  Allergies  Allergen Reactions  . Aleve [Naproxen Sodium]     Pt states can tolerate some, but causes GI upset  . Celebrex [Celecoxib] Other (See Comments)    Pt states can handle some, but causes GI upset  Current Outpatient Prescriptions  Medication Sig Dispense Refill  . atorvastatin (LIPITOR) 10 MG tablet Take 10 mg by mouth daily at 6 PM.     . carvedilol (COREG) 6.25 MG tablet Take 6.25 mg by mouth 2 (two) times daily with a meal.     . Cholecalciferol (VITAMIN D3) 2000 units capsule Take 2,000 Units by mouth daily.     . diclofenac sodium (VOLTAREN) 1 % GEL Apply topically 4  (four) times daily as needed.     . empagliflozin (JARDIANCE) 10 MG TABS tablet Take 10 mg by mouth daily.    . furosemide (LASIX) 40 MG tablet Take 40 mg by mouth daily.     Marland Kitchen levothyroxine (SYNTHROID, LEVOTHROID) 50 MCG tablet Take 25 mcg by mouth daily.     Marland Kitchen losartan (COZAAR) 50 MG tablet Take 50 mg by mouth 2 (two) times daily.     . Magnesium 250 MG TABS Take 1 tablet by mouth daily.     . metFORMIN (GLUCOPHAGE-XR) 500 MG 24 hr tablet Take 1,000 mg by mouth 2 (two) times daily.     . pantoprazole (PROTONIX) 40 MG tablet Take 40 mg by mouth daily.     . prochlorperazine (COMPAZINE) 10 MG tablet Take 10 mg by mouth every 6 (six) hours as needed.     . traMADol (ULTRAM) 50 MG tablet Take 50 mg by mouth every 6 (six) hours as needed.     . warfarin (COUMADIN) 4 MG tablet Take 4 mg by mouth daily after supper.      No current facility-administered medications for this visit.     OBJECTIVE: Vitals:   02/18/17 1103  BP: (!) 165/84  Pulse: (!) 54  Resp: 18  Temp: (!) 97.2 F (36.2 C)     Body mass index is 31.89 kg/m.    ECOG FS:0 - Asymptomatic  General: Well-developed, well-nourished, no acute distress. Eyes: Pink conjunctiva, anicteric sclera. Lungs: Clear to auscultation bilaterally. Heart: Regular rate and rhythm. No rubs, murmurs, or gallops. Abdomen: Soft, nontender, nondistended. No organomegaly noted, normoactive bowel sounds. Musculoskeletal: No edema, cyanosis, or clubbing. Neuro: Alert, answering all questions appropriately. Cranial nerves grossly intact. Skin: No rashes or petechiae noted. Psych: Normal affect.   LAB RESULTS:  Lab Results  Component Value Date   NA 140 02/11/2017   K 3.1 (L) 02/11/2017   CL 99 (L) 02/11/2017   CO2 32 02/11/2017   GLUCOSE 190 (H) 02/11/2017   BUN 17 02/11/2017   CREATININE 0.79 02/11/2017   CALCIUM 7.7 (L) 02/11/2017   PROT 6.2 (L) 02/11/2017   ALBUMIN 2.3 (L) 02/11/2017   AST 20 02/11/2017   ALT 15 02/11/2017   ALKPHOS  65 02/11/2017   BILITOT 0.6 02/11/2017   GFRNONAA >60 02/11/2017   GFRAA >60 02/11/2017    Lab Results  Component Value Date   WBC 6.3 02/11/2017   NEUTROABS 4.6 02/11/2017   HGB 10.5 (L) 02/11/2017   HCT 32.3 (L) 02/11/2017   MCV 84.2 02/11/2017   PLT 369 02/11/2017    STUDIES: Nm Pet Image Restag (ps) Skull Base To Thigh  Result Date: 02/15/2017 CLINICAL DATA:  Subsequent treatment strategy for lung cancer. EXAM: NUCLEAR MEDICINE PET SKULL BASE TO THIGH TECHNIQUE: 0.72 mCi F-18 FDG was injected intravenously. Full-ring PET imaging was performed from the skull base to thigh after the radiotracer. CT data was obtained and used for attenuation correction and anatomic localization. FASTING BLOOD GLUCOSE:  Value: 135 mg/dl COMPARISON:  Oct 13, 2015 PET-CT, December 27, 2016 brain MRI, and April 18 2 that 18 CT of the chest FINDINGS: NECK: The MRI from July 2018 demonstrated widespread brain metastases. Two of these metastases are identified on this study. MRI is more sensitive. These brain metastases were not seen on the previous PET-CT from 2017. No FDG avid disease identified in the neck. CHEST: The primary malignancy in the left upper lobe is again identified measuring 6.9 cm today versus 2.2 cm on the PET-CT from May 2017 and 7 cm on the comparison chest CT from April 2018. The maximum SUV is 10.6 today versus 12.4 on the previous PET-CT. Central photopenia is consistent with cavitation. A soft tissue metastasis is seen posterior to the right scapula on image 89, new since the previous PET-CT. This mass measures 3.9 cm today versus 2.8 cm in April of 2018. A mass in the right lateral lung on image 101 measures 4 by 2.5 cm today versus 1.6 cm on the previous PET-CT and 2.5 x 1.6 cm on the comparison CT scan. The maximum SUV is 11.7 today versus 2.3 in May of 2017. A mild A soft tissue nodule adjacent to the tip of the left scapula on image 102 is new. A nodule in the left base on image 105 is larger  compared to May of 2017 but similar when compared April of 2018. Mild increased uptake in the left hilum is likely metastatic. There is equivocal uptake in the right hilum. ABDOMEN/PELVIS: There is a lesion in the medial spleen which is new since May of 2017. It is difficult to measure today due the lack of contrast but it appears to be much smaller when compared to April of 2018. The splenic lesion is FDG avid with a maximum SUV of 10. The left adrenal metastasis measures 3.3 cm today versus 2.9 cm in April of 2018. It was difficult to measure in May of 2017 due to lack of contrast but measured approximately 2.7 cm in May 2017. The maximum SUV is 10.7 today versus 2.8 on the previous PET-CT. Physiologic uptake is seen within the bowel. No other soft tissue metastases are seen in the abdomen or pelvis SKELETON: There is a metastasis in the left posterior aspect of the L5 vertebral body with a maximum SUV of 13.9. This is new compared to May 2017. Mild uptake in the right iliac bone on image 206 and mild uptake in the left iliac bone on image 196 demonstrate no CT correlate. IMPRESSION: 1. The widespread brain metastases seen on the MRI from December 27, 2016 are not as well assessed on today's study. At least 2 of the metastases are identified however. 2. The primary malignancy in the left upper lobe is unchanged in size since April 2018 but much larger when compared to May of 2017. This mass is now centrally cavitated. The mass posterior to the right scapula is larger when compared April 2018 and new when compared to May of 2017. The mass in the lateral right lung has worsened compared to the previous CT and PET-CT as well. A new soft tissue lesion adjacent to the left scapula is new compared to May of 2017. The nodule in the left lung base is similar compared April 2018 but new compared to May 2017. The metastatic lesion in the left hilum is also new since May of 2017. 3. The splenic lesion is new compared to May  2017. It appears much smaller when compared to the recent CT scan. The left  adrenal metastasis has worsened compared to both previous studies. 4. The metastasis in L5 is new compared to May 2017 and was not imaged in April of 2018. Two other regions of mild uptake in the iliac bones as above demonstrate no CT correlate. Early metastasis in these regions would be difficult to exclude. Electronically Signed   By: Dorise Bullion III M.D   On: 02/15/2017 14:28    ONCOLOGIC HISTORY:   Patient was initially diagnosed with a 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 PET scan on August 25, 2012 revealed a hypermetabolic nodule 2.1 cm in the left apices. Bronchoscopy on August 31, 2012 did not show any evidence of malignancy, but CT-guided biopsy on September 18, 2012 confirmed squamous cell carcinoma. Patient was determined not to be a surgical candidate at that time and underwent SBRT in May 2014. CT scan on April 23, 2013 revealed an enlarging prevascular lymph node. PET scan on June 04, 2013 revealed a new enlarging left lobe paratracheal lymph node with no other evidence of metastatic disease. Subsequent EBUS on June 13, 2013 confirmed recurrent squamous cell carcinoma. Patient underwent XRT along with concurrent navelbine between July 16, 2013 - August 28, 2013. Patient was then disease-free until CT scan on Sep 30, 2015 revealed an interval increase of a nodule measuring up to 2.3 cm in the left lower lobe. PET scan on Oct 12, 4172 was hypermetabolic in the left upper, left lower and right middle lobe nodules concerning for neoplasm. Patient completed 4 cycles of dose reduced carboplatinum and gemcitabine between July 2017 and September 2017. Patient initiated [place] on Oct 08, 2016.  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  PLAN:    1. Recurrent, stage IV squamous cell carcinoma of the left  lung with bilateral lung metastasis. Now with multiple brain metastases: See oncologic history as above. PET scan results from February 15, 2017 reviewed independently and reported as above. Patient continues to refuse chemotherapy, therefore it was recommended that she continue with Tecentriq today. Patient has refused treatment today as well, but has agreed to return to clinic in 4 weeks for further evaluation and consideration of reinitiation of treatment. Discontinuing treatment altogether and enrolling in hospice was also mentioned, but patient is not interested in hospice care at this time. 2. Hypertension: Patient's blood pressure is elevated today. Continue current medications. Patient admits noncompliance to taking her blood pressure medications.  3. Hypokalemia: Mild. Patient was previously given dietary recommendations. She has refused oral supplementation 4. Anemia: Mild, monitor. 5. Aortic aneurysm: Previously evaluated in the past at [facility]. CT scan results indicate stability of her aneurysm. Patient reports surgery was recommended previously, but she declined. She indicated she will also will likely decline any interventions at this time again. Previously a referral was made, back to her cardiothoracic surgeon, but patient canceled the appointment. 6. Cough: Have recommended OTC treatments. Consider chest x-ray in the future. 7. Brain metastasis: MRI results reviewed independently. Patient has now completed XRT and has tapered off her dexamethasone. 8. Hyperglycemia: Elevated, but improved.  Patient expressed understanding and was in agreement with this plan. She also understands that She can call clinic at any time with any questions, concerns, or complaints.    Lloyd Huger, MD   02/23/2017 12:54 PM

## 2017-02-18 ENCOUNTER — Inpatient Hospital Stay (HOSPITAL_BASED_OUTPATIENT_CLINIC_OR_DEPARTMENT_OTHER): Payer: Medicare Other | Admitting: Oncology

## 2017-02-18 ENCOUNTER — Inpatient Hospital Stay: Payer: Medicare Other

## 2017-02-18 VITALS — BP 165/84 | HR 54 | Temp 97.2°F | Resp 18 | Wt 180.0 lb

## 2017-02-18 DIAGNOSIS — E039 Hypothyroidism, unspecified: Secondary | ICD-10-CM

## 2017-02-18 DIAGNOSIS — Z801 Family history of malignant neoplasm of trachea, bronchus and lung: Secondary | ICD-10-CM

## 2017-02-18 DIAGNOSIS — R531 Weakness: Secondary | ICD-10-CM | POA: Diagnosis not present

## 2017-02-18 DIAGNOSIS — E1165 Type 2 diabetes mellitus with hyperglycemia: Secondary | ICD-10-CM

## 2017-02-18 DIAGNOSIS — D649 Anemia, unspecified: Secondary | ICD-10-CM

## 2017-02-18 DIAGNOSIS — I719 Aortic aneurysm of unspecified site, without rupture: Secondary | ICD-10-CM

## 2017-02-18 DIAGNOSIS — J449 Chronic obstructive pulmonary disease, unspecified: Secondary | ICD-10-CM

## 2017-02-18 DIAGNOSIS — I509 Heart failure, unspecified: Secondary | ICD-10-CM

## 2017-02-18 DIAGNOSIS — C7800 Secondary malignant neoplasm of unspecified lung: Secondary | ICD-10-CM

## 2017-02-18 DIAGNOSIS — I10 Essential (primary) hypertension: Secondary | ICD-10-CM

## 2017-02-18 DIAGNOSIS — Z79899 Other long term (current) drug therapy: Secondary | ICD-10-CM

## 2017-02-18 DIAGNOSIS — E876 Hypokalemia: Secondary | ICD-10-CM | POA: Diagnosis not present

## 2017-02-18 DIAGNOSIS — C3492 Malignant neoplasm of unspecified part of left bronchus or lung: Secondary | ICD-10-CM

## 2017-02-18 DIAGNOSIS — C7931 Secondary malignant neoplasm of brain: Secondary | ICD-10-CM | POA: Diagnosis not present

## 2017-02-18 DIAGNOSIS — D7389 Other diseases of spleen: Secondary | ICD-10-CM

## 2017-02-18 DIAGNOSIS — I4891 Unspecified atrial fibrillation: Secondary | ICD-10-CM

## 2017-02-18 DIAGNOSIS — Z9221 Personal history of antineoplastic chemotherapy: Secondary | ICD-10-CM

## 2017-02-18 DIAGNOSIS — Z7901 Long term (current) use of anticoagulants: Secondary | ICD-10-CM

## 2017-02-18 DIAGNOSIS — C7801 Secondary malignant neoplasm of right lung: Secondary | ICD-10-CM

## 2017-02-18 DIAGNOSIS — Z923 Personal history of irradiation: Secondary | ICD-10-CM

## 2017-02-18 DIAGNOSIS — R05 Cough: Secondary | ICD-10-CM

## 2017-02-18 DIAGNOSIS — R5383 Other fatigue: Secondary | ICD-10-CM

## 2017-02-18 DIAGNOSIS — Z7984 Long term (current) use of oral hypoglycemic drugs: Secondary | ICD-10-CM

## 2017-02-18 DIAGNOSIS — Z87891 Personal history of nicotine dependence: Secondary | ICD-10-CM

## 2017-02-18 DIAGNOSIS — E785 Hyperlipidemia, unspecified: Secondary | ICD-10-CM

## 2017-02-24 ENCOUNTER — Ambulatory Visit
Admission: RE | Admit: 2017-02-24 | Discharge: 2017-02-24 | Disposition: A | Discharge status: Home / Self Care | Payer: Medicare Other | Source: Ambulatory Visit | Attending: Radiation Oncology | Admitting: Radiation Oncology

## 2017-02-24 ENCOUNTER — Encounter: Payer: Self-pay | Admitting: Radiation Oncology

## 2017-02-24 VITALS — BP 155/75 | HR 62 | Temp 95.4°F

## 2017-02-24 DIAGNOSIS — Z87891 Personal history of nicotine dependence: Secondary | ICD-10-CM | POA: Diagnosis not present

## 2017-02-24 DIAGNOSIS — C3412 Malignant neoplasm of upper lobe, left bronchus or lung: Secondary | ICD-10-CM | POA: Insufficient documentation

## 2017-02-24 DIAGNOSIS — C7931 Secondary malignant neoplasm of brain: Secondary | ICD-10-CM | POA: Diagnosis present

## 2017-02-24 DIAGNOSIS — Z510 Encounter for antineoplastic radiation therapy: Secondary | ICD-10-CM | POA: Diagnosis not present

## 2017-02-24 DIAGNOSIS — C7951 Secondary malignant neoplasm of bone: Secondary | ICD-10-CM | POA: Diagnosis not present

## 2017-02-24 NOTE — Progress Notes (Signed)
Radiation Oncology Follow up Note  Name: Kristen [name]   Date:   02/24/2017 MRN:  [id number] DOB: Nov 19, 1940    This 76 y.o. 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  REFERRING PROVIDER: Kirk Ruths, MD  HPI: patient is a 76 year old female now out 1 month having completed palliative radiation therapy to her whole brain for stage IV [place] cell carcinoma with brain metastasis. She seen today in routine follow-up. She's recently developed left sided lower back pain..repeat PET CT scan showed widespread brain metastasis have improved. She does have a Newman metastatic lesion on the left side of L5 which most likely is the source of her lower back pain. She is ambulating with some difficulty. She has been weaned off steroids she's having no change in her neurologic status at this time.  COMPLICATIONS OF TREATMENT: none  FOLLOW UP COMPLIANCE: keeps appointments   PHYSICAL EXAM:  BP (!) 155/75   Pulse 62   Temp (!) 95.4 F (35.2 C)  Well-developed wheelchair-bound female in NAD crude visual fields within normal range. Neurologic Exam is unchanged.pain is localized to her L5 region. Well-developed well-nourished patient in NAD. HEENT reveals PERLA, EOMI, discs not visualized.  Oral cavity is clear. No oral mucosal lesions are identified. Neck is clear without evidence of cervical or supraclavicular adenopathy. Lungs are clear to A&P. Cardiac examination is essentially unremarkable with regular rate and rhythm without murmur rub or thrill. Abdomen is benign with no organomegaly or masses noted. Motor sensory and DTR levels are equal and symmetric in the upper and lower extremities. Cranial nerves II through XII are grossly intact. Proprioception is intact. No peripheral adenopathy or edema is identified. No motor or sensory levels are noted. Crude visual fields  are within normal range. RADIOLOGY RESULTS: PET scan is reviewed and compatible with the above-stated findings  PLAN: patient is currently on immunotherapy. She does have progression of disease. I've offered palliative course of radiation therapy to L5 3000 cGy in 10 fractions. I personally set up and ordered CT simulation for next week. Patient and her family will is the side by that time whether they would undertake that course of palliative treatment. Otherwise I will turn follow-up care over to medical oncology.  I would like to take this opportunity to thank you for allowing me to participate in the care of your patient.Armstead Peaks., MD

## 2017-03-02 ENCOUNTER — Ambulatory Visit
Admission: RE | Admit: 2017-03-02 | Discharge: 2017-03-02 | Disposition: A | Discharge status: Home / Self Care | Payer: Medicare Other | Source: Ambulatory Visit | Attending: Radiation Oncology | Admitting: Radiation Oncology

## 2017-03-02 DIAGNOSIS — C7931 Secondary malignant neoplasm of brain: Secondary | ICD-10-CM | POA: Diagnosis not present

## 2017-03-03 ENCOUNTER — Telehealth: Payer: Self-pay

## 2017-03-03 DIAGNOSIS — C7931 Secondary malignant neoplasm of brain: Secondary | ICD-10-CM | POA: Diagnosis not present

## 2017-03-03 NOTE — Telephone Encounter (Signed)
Nutrition  Patient identified on Malnutrition Screening Report for weight loss and poor appetite  Called patient home phone and busy signal.  Called mobile number and received message that patient was not able to accept messages at this time.    Please refer patient to RD if can be of assistance.  [place] Agent B. Zenia Resides, [place], [place] Registered Dietitian [phone] (pager)

## 2017-03-07 ENCOUNTER — Ambulatory Visit
Admission: RE | Admit: 2017-03-07 | Discharge: 2017-03-07 | Disposition: A | Discharge status: Home / Self Care | Payer: Medicare Other | Source: Ambulatory Visit | Attending: Radiation Oncology | Admitting: Radiation Oncology

## 2017-03-07 DIAGNOSIS — C7931 Secondary malignant neoplasm of brain: Secondary | ICD-10-CM | POA: Diagnosis not present

## 2017-03-08 ENCOUNTER — Ambulatory Visit
Admission: RE | Admit: 2017-03-08 | Discharge: 2017-03-08 | Disposition: A | Discharge status: Home / Self Care | Payer: Medicare Other | Source: Ambulatory Visit | Attending: Radiation Oncology | Admitting: Radiation Oncology

## 2017-03-08 ENCOUNTER — Ambulatory Visit: Payer: Medicare Other

## 2017-03-08 ENCOUNTER — Telehealth: Payer: Self-pay | Admitting: *Deleted

## 2017-03-08 DIAGNOSIS — C7931 Secondary malignant neoplasm of brain: Secondary | ICD-10-CM | POA: Diagnosis not present

## 2017-03-08 NOTE — Telephone Encounter (Signed)
Agreed.  Thank you

## 2017-03-08 NOTE — Telephone Encounter (Signed)
Daughter called requesting Palliative Care services for patient. Please advise if in agreement, she has faxed over orders to be signed if in agreement

## 2017-03-09 ENCOUNTER — Ambulatory Visit: Payer: Medicare Other

## 2017-03-09 ENCOUNTER — Ambulatory Visit
Admission: RE | Admit: 2017-03-09 | Discharge: 2017-03-09 | Disposition: A | Discharge status: Home / Self Care | Payer: Medicare Other | Source: Ambulatory Visit | Attending: Radiation Oncology | Admitting: Radiation Oncology

## 2017-03-09 DIAGNOSIS — C7931 Secondary malignant neoplasm of brain: Secondary | ICD-10-CM | POA: Diagnosis not present

## 2017-03-09 NOTE — Telephone Encounter (Signed)
Orders and MR faxed

## 2017-03-10 ENCOUNTER — Ambulatory Visit
Admission: RE | Admit: 2017-03-10 | Discharge: 2017-03-10 | Disposition: A | Discharge status: Home / Self Care | Payer: Medicare Other | Source: Ambulatory Visit | Attending: Radiation Oncology | Admitting: Radiation Oncology

## 2017-03-10 DIAGNOSIS — C7931 Secondary malignant neoplasm of brain: Secondary | ICD-10-CM | POA: Diagnosis not present

## 2017-03-11 ENCOUNTER — Ambulatory Visit
Admission: RE | Admit: 2017-03-11 | Discharge: 2017-03-11 | Disposition: A | Discharge status: Home / Self Care | Payer: Medicare Other | Source: Ambulatory Visit | Attending: Radiation Oncology | Admitting: Radiation Oncology

## 2017-03-11 DIAGNOSIS — C7931 Secondary malignant neoplasm of brain: Secondary | ICD-10-CM | POA: Diagnosis not present

## 2017-03-14 ENCOUNTER — Ambulatory Visit
Admission: RE | Admit: 2017-03-14 | Discharge: 2017-03-14 | Disposition: A | Discharge status: Home / Self Care | Payer: Medicare Other | Source: Ambulatory Visit | Attending: Radiation Oncology | Admitting: Radiation Oncology

## 2017-03-14 DIAGNOSIS — C7931 Secondary malignant neoplasm of brain: Secondary | ICD-10-CM | POA: Diagnosis not present

## 2017-03-15 ENCOUNTER — Ambulatory Visit
Admission: RE | Admit: 2017-03-15 | Discharge: 2017-03-15 | Disposition: A | Discharge status: Home / Self Care | Payer: Medicare Other | Source: Ambulatory Visit | Attending: Radiation Oncology | Admitting: Radiation Oncology

## 2017-03-15 DIAGNOSIS — C7931 Secondary malignant neoplasm of brain: Secondary | ICD-10-CM | POA: Diagnosis not present

## 2017-03-15 NOTE — Progress Notes (Deleted)
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 Now with multiple brain metastases.  INTERVAL HISTORY: Patient returns to clinic today for further evaluation, discussion of her imaging results, and reconsideration of cycle 7 of Tecentriq. She was recently diagnosed with multiple brain metastasis and has now completed XRT. She currently feels well, although admits to increased weakness and fatigue. She has no focal neurologic complaints. She denies any recent fevers or illnesses. She has a good appetite and denies weight loss. She has no chest pain or shortness of breath. She denies any nausea, vomiting, constipation, or diarrhea. She has no urinary complaints. Patient offers no further specific complaints today.   REVIEW OF SYSTEMS:   Review of Systems  Constitutional: Positive for malaise/fatigue. Negative for fever and weight loss.  Respiratory: Positive for cough. Negative for shortness of breath.   Cardiovascular: Negative.  Negative for chest pain and leg swelling.  Gastrointestinal: Negative.  Negative for abdominal pain, nausea and vomiting.  Genitourinary: Negative.   Musculoskeletal: Positive for joint pain. Negative for falls.  Skin: Negative.  Negative for rash.  Neurological: Positive for weakness. Negative for sensory change and focal weakness.  Psychiatric/Behavioral: Negative.  The patient is not nervous/anxious.    As per HPI. Otherwise, a complete review of systems is negative.   PAST MEDICAL HISTORY: Past Medical History:  Diagnosis Date  . Atrial fibrillation ([place])   . CHF (congestive heart failure) ([place])   . COPD (chronic obstructive pulmonary disease) ([place])   . Diabetes  mellitus without complication ([place])   . Hyperlipidemia   . Hypertension   . Hypothyroidism   . Lung cancer [facility]) 2015   Dr Merleen Nicely [facility], chemo and rad  . Metastatic cancer to brain ([place])   . Personal history of chemotherapy   . Personal history of radiation therapy   . Thyroid disease     PAST SURGICAL HISTORY: Past Surgical History:  Procedure Laterality Date  . COLONOSCOPY  2006   Dr Raynelle Fanning patient ?polyps  . COLONOSCOPY WITH PROPOFOL N/A 01/30/2015   Procedure: COLONOSCOPY WITH PROPOFOL;  Surgeon: Manya Silvas, MD;  Location: [facility] ENDOSCOPY;  Service: Endoscopy;  Laterality: N/A;  . ESOPHAGOGASTRODUODENOSCOPY (EGD) WITH PROPOFOL N/A 01/30/2015   Procedure: ESOPHAGOGASTRODUODENOSCOPY (EGD) WITH PROPOFOL;  Surgeon: Manya Silvas, MD;  Location: [facility] ENDOSCOPY;  Service: Endoscopy;  Laterality: N/A;  . JOINT REPLACEMENT    . Left knee surgery Left   . LUNG BIOPSY      FAMILY HISTORY Family History  Problem Relation Age of Onset  . Heart attack Mother   . Lung cancer Father   . Heart attack Son 83  . Breast cancer Neg Hx        ADVANCED DIRECTIVES:    HEALTH MAINTENANCE: Social History  Substance Use Topics  . Smoking status: Former Smoker    Quit date: 10/29/2013  . Smokeless tobacco: Never Used  . Alcohol use No     Colonoscopy:  PAP:  Bone density:  Lipid panel:  Allergies  Allergen Reactions  . Aleve [Naproxen Sodium]     Pt states can tolerate some, but causes GI upset  . Celebrex [Celecoxib] Other (See Comments)    Pt states can handle some, but causes GI upset  Current Outpatient Prescriptions  Medication Sig Dispense Refill  . atorvastatin (LIPITOR) 10 MG tablet Take 10 mg by mouth daily at 6 PM.     . carvedilol (COREG) 6.25 MG tablet Take 6.25 mg by mouth 2 (two) times daily with a meal.     . Cholecalciferol (VITAMIN D3) 2000 units capsule Take 2,000 Units by mouth daily.     . diclofenac sodium (VOLTAREN) 1 % GEL Apply topically 4  (four) times daily as needed.     . empagliflozin (JARDIANCE) 10 MG TABS tablet Take 10 mg by mouth daily.    . furosemide (LASIX) 40 MG tablet Take 40 mg by mouth daily.     Marland Kitchen levothyroxine (SYNTHROID, LEVOTHROID) 50 MCG tablet Take 25 mcg by mouth daily.     Marland Kitchen losartan (COZAAR) 50 MG tablet Take 50 mg by mouth 2 (two) times daily.     . Magnesium 250 MG TABS Take 1 tablet by mouth daily.     . metFORMIN (GLUCOPHAGE-XR) 500 MG 24 hr tablet Take 1,000 mg by mouth 2 (two) times daily.     . pantoprazole (PROTONIX) 40 MG tablet Take 40 mg by mouth daily.     . prochlorperazine (COMPAZINE) 10 MG tablet Take 10 mg by mouth every 6 (six) hours as needed.     . traMADol (ULTRAM) 50 MG tablet Take 50 mg by mouth every 6 (six) hours as needed.     . warfarin (COUMADIN) 4 MG tablet Take 4 mg by mouth daily after supper.      No current facility-administered medications for this visit.     OBJECTIVE: There were no vitals filed for this visit.   There is no height or weight on file to calculate BMI.    ECOG FS:0 - Asymptomatic  General: Well-developed, well-nourished, no acute distress. Eyes: Pink conjunctiva, anicteric sclera. Lungs: Clear to auscultation bilaterally. Heart: Regular rate and rhythm. No rubs, murmurs, or gallops. Abdomen: Soft, nontender, nondistended. No organomegaly noted, normoactive bowel sounds. Musculoskeletal: No edema, cyanosis, or clubbing. Neuro: Alert, answering all questions appropriately. Cranial nerves grossly intact. Skin: No rashes or petechiae noted. Psych: Normal affect.   LAB RESULTS:  Lab Results  Component Value Date   NA 140 02/11/2017   K 3.1 (L) 02/11/2017   CL 99 (L) 02/11/2017   CO2 32 02/11/2017   GLUCOSE 190 (H) 02/11/2017   BUN 17 02/11/2017   CREATININE 0.79 02/11/2017   CALCIUM 7.7 (L) 02/11/2017   PROT 6.2 (L) 02/11/2017   ALBUMIN 2.3 (L) 02/11/2017   AST 20 02/11/2017   ALT 15 02/11/2017   ALKPHOS 65 02/11/2017   BILITOT 0.6  02/11/2017   GFRNONAA >60 02/11/2017   GFRAA >60 02/11/2017    Lab Results  Component Value Date   WBC 6.3 02/11/2017   NEUTROABS 4.6 02/11/2017   HGB 10.5 (L) 02/11/2017   HCT 32.3 (L) 02/11/2017   MCV 84.2 02/11/2017   PLT 369 02/11/2017    STUDIES: Nm Pet Image Restag (ps) Skull Base To Thigh  Result Date: 02/15/2017 CLINICAL DATA:  Subsequent treatment strategy for lung cancer. EXAM: NUCLEAR MEDICINE PET SKULL BASE TO THIGH TECHNIQUE: 0.72 mCi F-18 FDG was injected intravenously. Full-ring PET imaging was performed from the skull base to thigh after the radiotracer. CT data was obtained and used for attenuation correction and anatomic localization. FASTING BLOOD GLUCOSE:  Value: 135 mg/dl COMPARISON:  Oct 13, 2015 PET-CT, December 27, 2016 brain MRI, and April 18 2 that  18 CT of the chest FINDINGS: NECK: The MRI from July 2018 demonstrated widespread brain metastases. Two of these metastases are identified on this study. MRI is more sensitive. These brain metastases were not seen on the previous PET-CT from 2017. No FDG avid disease identified in the neck. CHEST: The primary malignancy in the left upper lobe is again identified measuring 6.9 cm today versus 2.2 cm on the PET-CT from May 2017 and 7 cm on the comparison chest CT from April 2018. The maximum SUV is 10.6 today versus 12.4 on the previous PET-CT. Central photopenia is consistent with cavitation. A soft tissue metastasis is seen posterior to the right scapula on image 89, new since the previous PET-CT. This mass measures 3.9 cm today versus 2.8 cm in April of 2018. A mass in the right lateral lung on image 101 measures 4 by 2.5 cm today versus 1.6 cm on the previous PET-CT and 2.5 x 1.6 cm on the comparison CT scan. The maximum SUV is 11.7 today versus 2.3 in May of 2017. A mild A soft tissue nodule adjacent to the tip of the left scapula on image 102 is new. A nodule in the left base on image 105 is larger compared to May of 2017 but  similar when compared April of 2018. Mild increased uptake in the left hilum is likely metastatic. There is equivocal uptake in the right hilum. ABDOMEN/PELVIS: There is a lesion in the medial spleen which is new since May of 2017. It is difficult to measure today due the lack of contrast but it appears to be much smaller when compared to April of 2018. The splenic lesion is FDG avid with a maximum SUV of 10. The left adrenal metastasis measures 3.3 cm today versus 2.9 cm in April of 2018. It was difficult to measure in May of 2017 due to lack of contrast but measured approximately 2.7 cm in May 2017. The maximum SUV is 10.7 today versus 2.8 on the previous PET-CT. Physiologic uptake is seen within the bowel. No other soft tissue metastases are seen in the abdomen or pelvis SKELETON: There is a metastasis in the left posterior aspect of the L5 vertebral body with a maximum SUV of 13.9. This is new compared to May 2017. Mild uptake in the right iliac bone on image 206 and mild uptake in the left iliac bone on image 196 demonstrate no CT correlate. IMPRESSION: 1. The widespread brain metastases seen on the MRI from December 27, 2016 are not as well assessed on today's study. At least 2 of the metastases are identified however. 2. The primary malignancy in the left upper lobe is unchanged in size since April 2018 but much larger when compared to May of 2017. This mass is now centrally cavitated. The mass posterior to the right scapula is larger when compared April 2018 and new when compared to May of 2017. The mass in the lateral right lung has worsened compared to the previous CT and PET-CT as well. A new soft tissue lesion adjacent to the left scapula is new compared to May of 2017. The nodule in the left lung base is similar compared April 2018 but new compared to May 2017. The metastatic lesion in the left hilum is also new since May of 2017. 3. The splenic lesion is new compared to May 2017. It appears much smaller  when compared to the recent CT scan. The left adrenal metastasis has worsened compared to both previous studies. 4. The metastasis  in L5 is new compared to May 2017 and was not imaged in April of 2018. Two other regions of mild uptake in the iliac bones as above demonstrate no CT correlate. Early metastasis in these regions would be difficult to exclude. Electronically Signed   By: Dorise Bullion III M.D   On: 02/15/2017 14:28    ONCOLOGIC HISTORY:   Patient was initially diagnosed with a 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 PET scan on August 25, 2012 revealed a hypermetabolic nodule 2.1 cm in the left apices. Bronchoscopy on August 31, 2012 did not show any evidence of malignancy, but CT-guided biopsy on September 18, 2012 confirmed squamous cell carcinoma. Patient was determined not to be a surgical candidate at that time and underwent SBRT in May 2014. CT scan on April 23, 2013 revealed an enlarging prevascular lymph node. PET scan on June 04, 2013 revealed a new enlarging left lobe paratracheal lymph node with no other evidence of metastatic disease. Subsequent EBUS on June 13, 2013 confirmed recurrent squamous cell carcinoma. Patient underwent XRT along with concurrent navelbine between July 16, 2013 - August 28, 2013. Patient was then disease-free until CT scan on Sep 30, 2015 revealed an interval increase of a nodule measuring up to 2.3 cm in the left lower lobe. PET scan on Oct 13, 7791 was hypermetabolic in the left upper, left lower and right middle lobe nodules concerning for neoplasm. Patient completed 4 cycles of dose reduced carboplatinum and gemcitabine between July 2017 and September 2017. Patient initiated [place] on Oct 08, 2016.  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  PLAN:    1. Recurrent, stage IV squamous cell carcinoma of the left lung with bilateral lung  metastasis. Now with multiple brain metastases: See oncologic history as above. PET scan results from February 15, 2017 reviewed independently and reported as above. Patient continues to refuse chemotherapy, therefore it was recommended that she continue with Tecentriq today. Patient has refused treatment today as well, but has agreed to return to clinic in 4 weeks for further evaluation and consideration of reinitiation of treatment. Discontinuing treatment altogether and enrolling in hospice was also mentioned, but patient is not interested in hospice care at this time. 2. Hypertension: Patient's blood pressure is elevated today. Continue current medications. Patient admits noncompliance to taking her blood pressure medications.  3. Hypokalemia: Mild. Patient was previously given dietary recommendations. She has refused oral supplementation 4. Anemia: Mild, monitor. 5. Aortic aneurysm: Previously evaluated in the past at [facility]. CT scan results indicate stability of her aneurysm. Patient reports surgery was recommended previously, but she declined. She indicated she will also will likely decline any interventions at this time again. Previously a referral was made, back to her cardiothoracic surgeon, but patient canceled the appointment. 6. Cough: Have recommended OTC treatments. Consider chest x-ray in the future. 7. Brain metastasis: MRI results reviewed independently. Patient has now completed XRT and has tapered off her dexamethasone. 8. Hyperglycemia: Elevated, but improved.  Patient expressed understanding and was in agreement with this plan. She also understands that She can call clinic at any time with any questions, concerns, or complaints.    Lloyd Huger, MD   03/15/2017 10:43 PM

## 2017-03-16 ENCOUNTER — Ambulatory Visit
Admission: RE | Admit: 2017-03-16 | Discharge: 2017-03-16 | Disposition: A | Discharge status: Home / Self Care | Payer: Medicare Other | Source: Ambulatory Visit | Attending: Radiation Oncology | Admitting: Radiation Oncology

## 2017-03-16 DIAGNOSIS — C7931 Secondary malignant neoplasm of brain: Secondary | ICD-10-CM | POA: Diagnosis not present

## 2017-03-17 ENCOUNTER — Ambulatory Visit: Admission: RE | Admit: 2017-03-17 | Payer: Medicare Other | Source: Ambulatory Visit

## 2017-03-18 ENCOUNTER — Inpatient Hospital Stay: Payer: Medicare Other | Admitting: Oncology

## 2017-03-18 ENCOUNTER — Ambulatory Visit
Admission: RE | Admit: 2017-03-18 | Discharge: 2017-03-18 | Disposition: A | Discharge status: Home / Self Care | Payer: Medicare Other | Source: Ambulatory Visit | Attending: Radiation Oncology | Admitting: Radiation Oncology

## 2017-03-18 ENCOUNTER — Inpatient Hospital Stay: Payer: Medicare Other

## 2017-03-18 DIAGNOSIS — C7931 Secondary malignant neoplasm of brain: Secondary | ICD-10-CM | POA: Diagnosis not present

## 2017-03-21 ENCOUNTER — Ambulatory Visit: Payer: Medicare Other

## 2017-03-22 ENCOUNTER — Ambulatory Visit: Payer: Medicare Other

## 2017-03-22 ENCOUNTER — Ambulatory Visit
Admission: RE | Admit: 2017-03-22 | Discharge: 2017-03-22 | Disposition: A | Discharge status: Home / Self Care | Payer: Medicare Other | Source: Ambulatory Visit | Attending: Radiation Oncology | Admitting: Radiation Oncology

## 2017-03-22 DIAGNOSIS — C7931 Secondary malignant neoplasm of brain: Secondary | ICD-10-CM | POA: Diagnosis not present

## 2017-03-23 ENCOUNTER — Ambulatory Visit
Admission: RE | Admit: 2017-03-23 | Discharge: 2017-03-23 | Disposition: A | Discharge status: Home / Self Care | Payer: Medicare Other | Source: Ambulatory Visit | Attending: Radiation Oncology | Admitting: Radiation Oncology

## 2017-03-23 ENCOUNTER — Ambulatory Visit: Payer: Medicare Other

## 2017-03-23 DIAGNOSIS — C7931 Secondary malignant neoplasm of brain: Secondary | ICD-10-CM | POA: Diagnosis not present

## 2017-03-23 NOTE — Progress Notes (Signed)
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  INTERVAL HISTORY: Patient returns to clinic today for further evaluation and discussion on whether to reinitiate treatment or not.  She continues to have a decreased performance status and persistent weakness and fatigue. She has no focal neurologic complaints. She denies any recent fevers or illnesses. She has a poor appetite, but her weight has remained stable. She has no chest pain or shortness of breath. She denies any nausea, vomiting, constipation, or diarrhea. She has no urinary complaints. Patient offers no further specific complaints today.   REVIEW OF SYSTEMS:   Review of Systems  Constitutional: Positive for malaise/fatigue. Negative for fever and weight loss.  Respiratory: Positive for cough. Negative for shortness of breath.   Cardiovascular: Negative.  Negative for chest pain and leg swelling.  Gastrointestinal: Negative.  Negative for abdominal pain, nausea and vomiting.  Genitourinary: Negative.   Musculoskeletal: Positive for joint pain. Negative for falls.  Skin: Negative.  Negative for rash.  Neurological: Positive for weakness. Negative for sensory change and focal weakness.  Psychiatric/Behavioral: Negative.  The patient is not nervous/anxious.    As per HPI. Otherwise, a complete review of systems is negative.   PAST MEDICAL HISTORY: Past Medical History:  Diagnosis Date  . Atrial fibrillation ([place])   . CHF (congestive heart failure) ([place])   . COPD (chronic obstructive pulmonary disease) ([place])   . Diabetes mellitus without complication ([place])   . Hyperlipidemia   . Hypertension   .  Hypothyroidism   . Lung cancer [facility] 2015   Dr Merleen Nicely [facility], chemo and rad  . Metastatic cancer to brain ([place])   . Personal history of chemotherapy   . Personal history of radiation therapy   . Thyroid disease     PAST SURGICAL HISTORY: Past Surgical History:  Procedure Laterality Date  . COLONOSCOPY  2006   Dr Raynelle Fanning patient ?polyps  . COLONOSCOPY WITH PROPOFOL N/A 01/30/2015   Procedure: COLONOSCOPY WITH PROPOFOL;  Surgeon: Manya Silvas, MD;  Location: [facility] ENDOSCOPY;  Service: Endoscopy;  Laterality: N/A;  . ESOPHAGOGASTRODUODENOSCOPY (EGD) WITH PROPOFOL N/A 01/30/2015   Procedure: ESOPHAGOGASTRODUODENOSCOPY (EGD) WITH PROPOFOL;  Surgeon: Manya Silvas, MD;  Location: [facility] ENDOSCOPY;  Service: Endoscopy;  Laterality: N/A;  . JOINT REPLACEMENT    . Left knee surgery Left   . LUNG BIOPSY      FAMILY HISTORY Family History  Problem Relation Age of Onset  . Heart attack Mother   . Lung cancer Father   . Heart attack Son 39  . Breast cancer Neg Hx        ADVANCED DIRECTIVES:    HEALTH MAINTENANCE: Social History  Substance Use Topics  . Smoking status: Former Smoker    Quit date: 10/29/2013  . Smokeless tobacco: Never Used  . Alcohol use No     Colonoscopy:  PAP:  Bone density:  Lipid panel:  Allergies  Allergen Reactions  . Aleve [Naproxen Sodium]     Pt states can tolerate some, but causes GI upset  . Celebrex [Celecoxib] Other (See Comments)    Pt states can handle some, but causes GI upset    Current Outpatient Prescriptions  Medication Sig Dispense Refill  .  atorvastatin (LIPITOR) 10 MG tablet Take 10 mg by mouth daily at 6 PM.     . carvedilol (COREG) 6.25 MG tablet Take 6.25 mg by mouth 2 (two) times daily with a meal.     . Cholecalciferol (VITAMIN D3) 2000 units capsule Take 2,000 Units by mouth daily.     . diclofenac sodium (VOLTAREN) 1 % GEL Apply topically 4 (four) times daily as needed.     . empagliflozin (JARDIANCE) 10 MG TABS tablet  Take 10 mg by mouth daily.    . furosemide (LASIX) 40 MG tablet Take 40 mg by mouth daily.     Marland Kitchen levothyroxine (SYNTHROID, LEVOTHROID) 50 MCG tablet Take 25 mcg by mouth daily.     Marland Kitchen losartan (COZAAR) 50 MG tablet Take 50 mg by mouth 2 (two) times daily.     . Magnesium 250 MG TABS Take 1 tablet by mouth daily.     . metFORMIN (GLUCOPHAGE-XR) 500 MG 24 hr tablet Take 1,000 mg by mouth 2 (two) times daily.     . pantoprazole (PROTONIX) 40 MG tablet Take 40 mg by mouth daily.     . prochlorperazine (COMPAZINE) 10 MG tablet Take 10 mg by mouth every 6 (six) hours as needed.     . traMADol (ULTRAM) 50 MG tablet Take 50 mg by mouth every 6 (six) hours as needed.     . warfarin (COUMADIN) 4 MG tablet Take 4 mg by mouth daily after supper.      No current facility-administered medications for this visit.     OBJECTIVE: Vitals:   03/25/17 1042  BP: (!) 147/77  Pulse: (!) 49  Resp: 20  Temp: 97.7 F (36.5 C)     There is no height or weight on file to calculate BMI.    ECOG FS:3 - Symptomatic, >50% confined to bed  General: Well-developed, well-nourished, no acute distress. Eyes: Pink conjunctiva, anicteric sclera. Lungs: Clear to auscultation bilaterally. Heart: Regular rate and rhythm. No rubs, murmurs, or gallops. Abdomen: Soft, nontender, nondistended. No organomegaly noted, normoactive bowel sounds. Musculoskeletal: No edema, cyanosis, or clubbing. Neuro: Alert, answering all questions appropriately. Cranial nerves grossly intact. Skin: No rashes or petechiae noted. Psych: Normal affect.   LAB RESULTS:  Lab Results  Component Value Date   NA 140 02/11/2017   K 3.1 (L) 02/11/2017   CL 99 (L) 02/11/2017   CO2 32 02/11/2017   GLUCOSE 190 (H) 02/11/2017   BUN 17 02/11/2017   CREATININE 0.79 02/11/2017   CALCIUM 7.7 (L) 02/11/2017   PROT 6.2 (L) 02/11/2017   ALBUMIN 2.3 (L) 02/11/2017   AST 20 02/11/2017   ALT 15 02/11/2017   ALKPHOS 65 02/11/2017   BILITOT 0.6 02/11/2017     GFRNONAA >60 02/11/2017   GFRAA >60 02/11/2017    Lab Results  Component Value Date   WBC 6.3 02/11/2017   NEUTROABS 4.6 02/11/2017   HGB 10.5 (L) 02/11/2017   HCT 32.3 (L) 02/11/2017   MCV 84.2 02/11/2017   PLT 369 02/11/2017    STUDIES: No results found.  ONCOLOGIC HISTORY:   Patient was initially diagnosed with a 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 PET scan on August 25, 2012 revealed a hypermetabolic nodule 2.1 cm in the left apices. Bronchoscopy on August 31, 2012 did not show any evidence of malignancy, but CT-guided biopsy on September 18, 2012 confirmed squamous cell carcinoma. Patient was determined not to be a surgical candidate at that time and underwent  SBRT in May 2014. CT scan on April 23, 2013 revealed an enlarging prevascular lymph node. PET scan on June 04, 2013 revealed a new enlarging left lobe paratracheal lymph node with no other evidence of metastatic disease. Subsequent EBUS on June 13, 2013 confirmed recurrent squamous cell carcinoma. Patient underwent XRT along with concurrent navelbine between July 16, 2013 - August 28, 2013. Patient was then disease-free until CT scan on Sep 30, 2015 revealed an interval increase of a nodule measuring up to 2.3 cm in the left lower lobe. PET scan on Oct 13, 4694 was hypermetabolic in the left upper, left lower and right middle lobe nodules concerning for neoplasm. Patient completed 4 cycles of dose reduced carboplatinum and gemcitabine between July 2017 and September 2017. Patient initiated [place] on Oct 08, 2016.  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  PLAN:    1. 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 PET scan results from February 15, 2017 reviewed independently with essentially  stable disease.  After lengthy discussion with the patient and her daughter, she wishes to discontinue all treatments at this time.  Hospice and end-of-life care were discussed at length, but patient is not ready to enroll at this time.  She will call clinic when she agrees to hospice.  Continue with outpatient palliative care.  No further follow-up has been scheduled.   2. Hypertension: Patient's blood pressure is elevated today. Continue current medications. Patient admits noncompliance to taking her blood pressure medications.  3. Hypokalemia: Mild. Patient was previously given dietary recommendations. She has refused oral supplementation 4. Anemia: Mild, monitor. 5. Aortic aneurysm: Previously evaluated in the past at [facility]. CT scan results indicate stability of her aneurysm. Patient reports surgery was recommended previously, but she declined. She indicated she will also will likely decline any interventions at this time again. Previously a referral was made, back to her cardiothoracic surgeon, but patient canceled the appointment. 6. Cough: Have recommended OTC treatments. Consider chest x-ray in the future. 7. Brain metastasis: MRI results reviewed independently. Patient has now completed XRT and has tapered off her dexamethasone.  No further treatment is planned. 8. Hyperglycemia: Elevated, but improved.  Approximately 30 minutes was spent in discussion of which greater than 50% was consultation.  Patient expressed understanding and was in agreement with this plan. She also understands that She can call clinic at any time with any questions, concerns, or complaints.    Lloyd Huger, MD   03/25/2017 2:04 PM

## 2017-03-24 ENCOUNTER — Ambulatory Visit: Payer: Medicare Other

## 2017-03-25 ENCOUNTER — Inpatient Hospital Stay: Payer: Medicare Other

## 2017-03-25 ENCOUNTER — Inpatient Hospital Stay: Payer: Medicare Other | Attending: Oncology | Admitting: Oncology

## 2017-03-25 VITALS — BP 147/77 | HR 49 | Temp 97.7°F | Resp 20

## 2017-03-25 DIAGNOSIS — E1165 Type 2 diabetes mellitus with hyperglycemia: Secondary | ICD-10-CM

## 2017-03-25 DIAGNOSIS — D649 Anemia, unspecified: Secondary | ICD-10-CM

## 2017-03-25 DIAGNOSIS — R531 Weakness: Secondary | ICD-10-CM | POA: Diagnosis not present

## 2017-03-25 DIAGNOSIS — I10 Essential (primary) hypertension: Secondary | ICD-10-CM

## 2017-03-25 DIAGNOSIS — R5383 Other fatigue: Secondary | ICD-10-CM

## 2017-03-25 DIAGNOSIS — I4891 Unspecified atrial fibrillation: Secondary | ICD-10-CM

## 2017-03-25 DIAGNOSIS — E039 Hypothyroidism, unspecified: Secondary | ICD-10-CM | POA: Diagnosis not present

## 2017-03-25 DIAGNOSIS — R05 Cough: Secondary | ICD-10-CM

## 2017-03-25 DIAGNOSIS — Z801 Family history of malignant neoplasm of trachea, bronchus and lung: Secondary | ICD-10-CM | POA: Diagnosis not present

## 2017-03-25 DIAGNOSIS — Z7901 Long term (current) use of anticoagulants: Secondary | ICD-10-CM | POA: Diagnosis not present

## 2017-03-25 DIAGNOSIS — I509 Heart failure, unspecified: Secondary | ICD-10-CM | POA: Diagnosis not present

## 2017-03-25 DIAGNOSIS — C7801 Secondary malignant neoplasm of right lung: Secondary | ICD-10-CM

## 2017-03-25 DIAGNOSIS — C3492 Malignant neoplasm of unspecified part of left bronchus or lung: Secondary | ICD-10-CM | POA: Diagnosis not present

## 2017-03-25 DIAGNOSIS — C7931 Secondary malignant neoplasm of brain: Secondary | ICD-10-CM

## 2017-03-25 DIAGNOSIS — Z9119 Patient's noncompliance with other medical treatment and regimen: Secondary | ICD-10-CM

## 2017-03-25 DIAGNOSIS — Z7984 Long term (current) use of oral hypoglycemic drugs: Secondary | ICD-10-CM

## 2017-03-25 DIAGNOSIS — C7802 Secondary malignant neoplasm of left lung: Secondary | ICD-10-CM

## 2017-03-25 DIAGNOSIS — J449 Chronic obstructive pulmonary disease, unspecified: Secondary | ICD-10-CM

## 2017-03-25 DIAGNOSIS — Z923 Personal history of irradiation: Secondary | ICD-10-CM

## 2017-03-25 DIAGNOSIS — Z9221 Personal history of antineoplastic chemotherapy: Secondary | ICD-10-CM

## 2017-03-25 DIAGNOSIS — E785 Hyperlipidemia, unspecified: Secondary | ICD-10-CM

## 2017-03-25 DIAGNOSIS — Z87891 Personal history of nicotine dependence: Secondary | ICD-10-CM

## 2017-03-25 DIAGNOSIS — I714 Abdominal aortic aneurysm, without rupture: Secondary | ICD-10-CM

## 2017-03-25 DIAGNOSIS — Z79899 Other long term (current) drug therapy: Secondary | ICD-10-CM | POA: Diagnosis not present

## 2017-03-25 NOTE — Progress Notes (Signed)
Patient here today for follow up, reports she would like to discuss further treatments with Dr. Grayland Ormond.

## 2017-04-01 ENCOUNTER — Telehealth: Payer: Self-pay | Admitting: *Deleted

## 2017-04-01 NOTE — Telephone Encounter (Signed)
Patient and family ready for Hospice Services per Palliative care nurse. Please send referral if in agreement.

## 2017-04-04 NOTE — Telephone Encounter (Signed)
Referral faxed

## 2017-04-05 ENCOUNTER — Telehealth: Payer: Self-pay | Admitting: *Deleted

## 2017-04-05 DIAGNOSIS — C3492 Malignant neoplasm of unspecified part of left bronchus or lung: Secondary | ICD-10-CM

## 2017-04-05 MED ORDER — TRAMADOL HCL 50 MG PO TABS: 50.0000 mg | ORAL_TABLET | Freq: Four times a day (QID) | ORAL | 2 refills | Status: AC | PRN

## 2017-04-05 NOTE — Telephone Encounter (Addendum)
Aking for refill of Tramadol, stating the only pain medicine she has on hand is Tylenol Arthritis

## 2017-04-05 NOTE — Telephone Encounter (Signed)
Hospice informed of prescription for Tramadol sent to pharmacy [facility])

## 2017-04-25 ENCOUNTER — Ambulatory Visit: Payer: Medicare Other | Attending: Radiation Oncology | Admitting: Radiation Oncology

## 2017-05-04 ENCOUNTER — Telehealth: Payer: Self-pay | Admitting: *Deleted

## 2017-05-04 MED ORDER — MORPHINE SULFATE (CONCENTRATE) 20 MG/ML PO SOLN: ORAL | 0 refills | Status: AC

## 2017-05-04 NOTE — Telephone Encounter (Signed)
Hospice nurse requesting prescription for Roxanol for terminal restlessness and pain

## 2017-06-10 ENCOUNTER — Telehealth: Payer: Self-pay | Admitting: *Deleted

## 2017-06-10 NOTE — Telephone Encounter (Signed)
Leslie with Hospice called to report that patient expired this morning at 8:30 AM at her home.

## 2017-06-18 ENCOUNTER — Other Ambulatory Visit: Payer: Self-pay | Admitting: Nurse Practitioner

## 2017-07-01 DEATH — deceased

## 2018-05-12 IMAGING — CT CT CHEST W/ CM
2 of 3 series · 15 of 36 positions shown, 18 images · IV contrast (iopamidol)
Comparison: 06/22/2016

CLINICAL DATA: Metastatic left-sided lung cancer.

EXAM:
CT CHEST WITH CONTRAST
TECHNIQUE: Multidetector CT imaging of the chest was performed during
intravenous contrast administration.
CONTRAST:  75mL 21UYH1-4DD IOPAMIDOL (21UYH1-4DD) INJECTION 61%

[Series 5: soft tissue. · axial · 0.65mm/px · z∈[-455,-189]mm · 12 of 157 slices shown, 15 images]
[im 12/157  mediastinal]
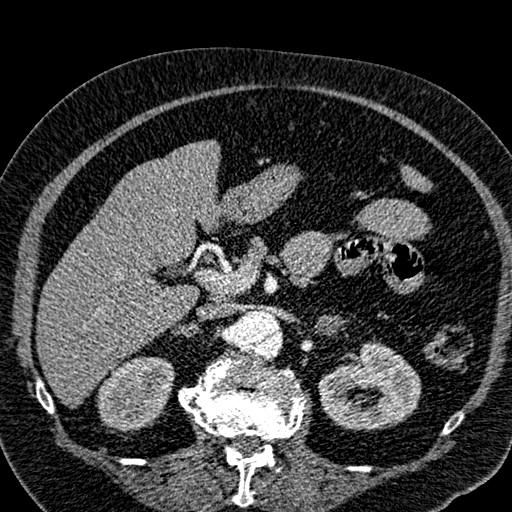
[im 12/157  lung]
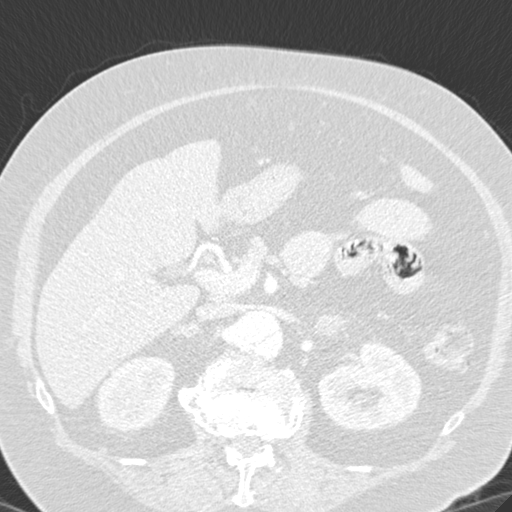
[im 24/157  lung]
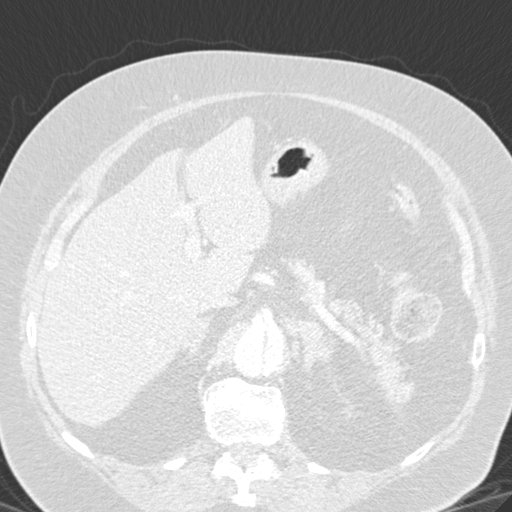
[im 35/157  lung]
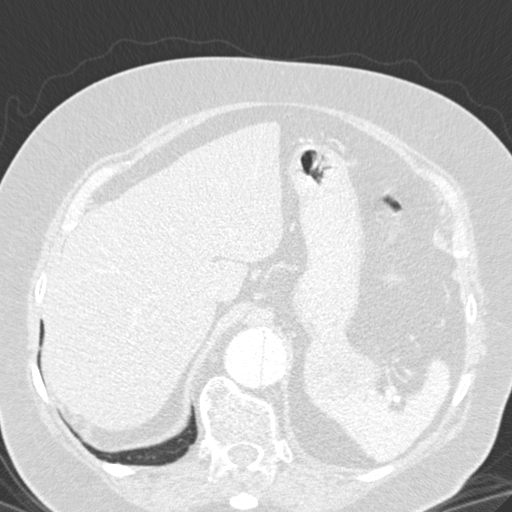
[im 47/157  lung]
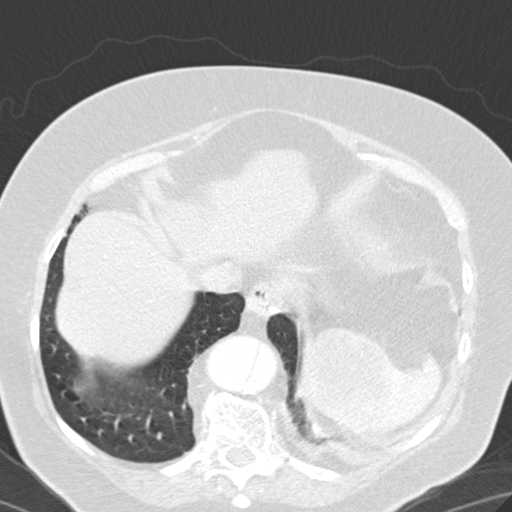
[im 58/157  mediastinal]
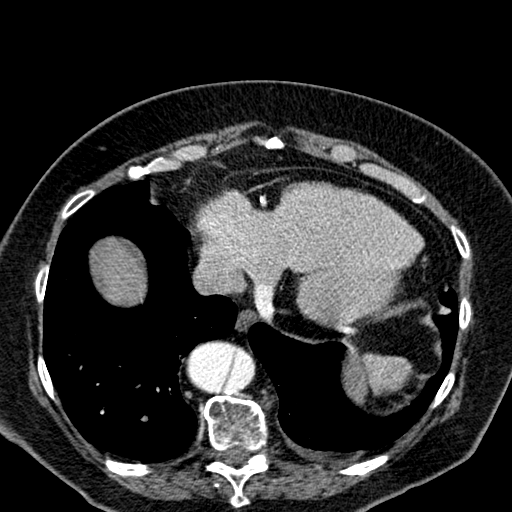
[im 58/157  lung]
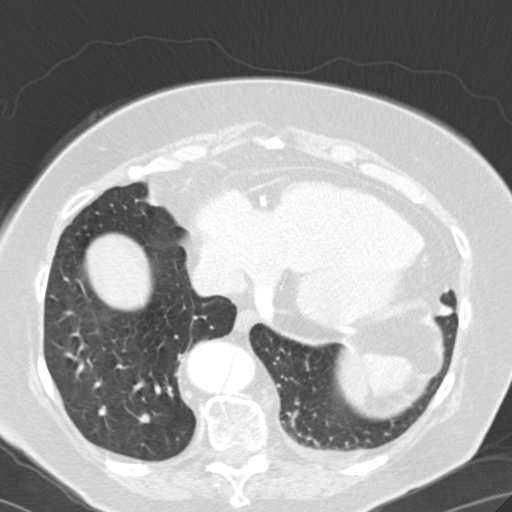
[im 70/157  lung]
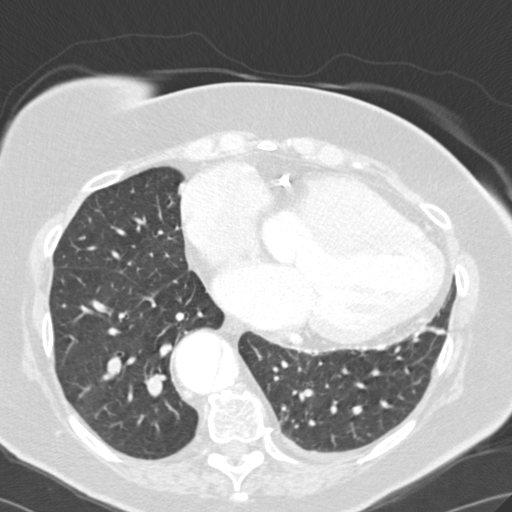
[im 87/157  lung]
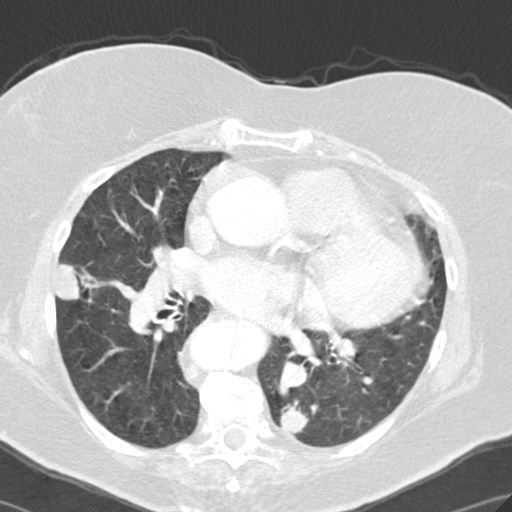
[im 99/157  lung]
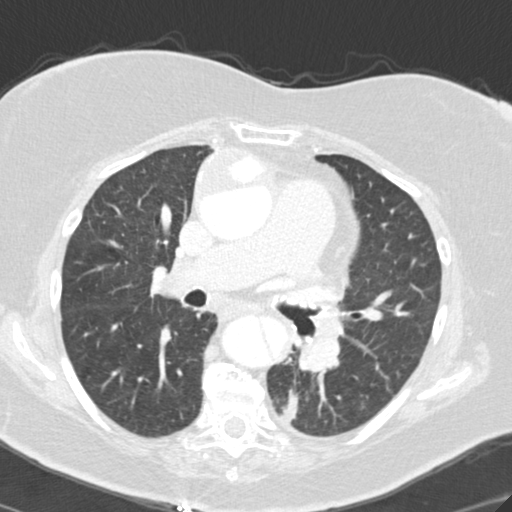
[im 110/157  mediastinal]
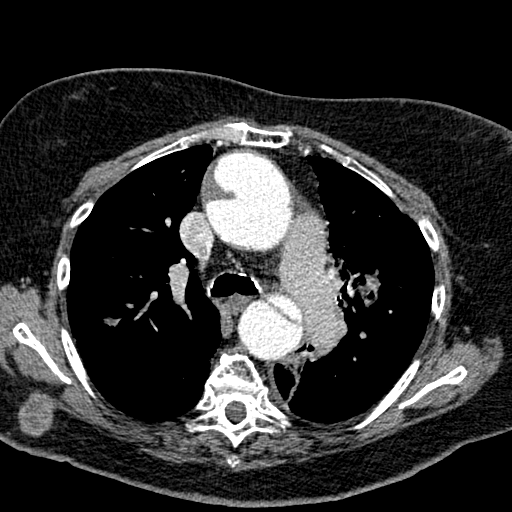
[im 110/157  lung]
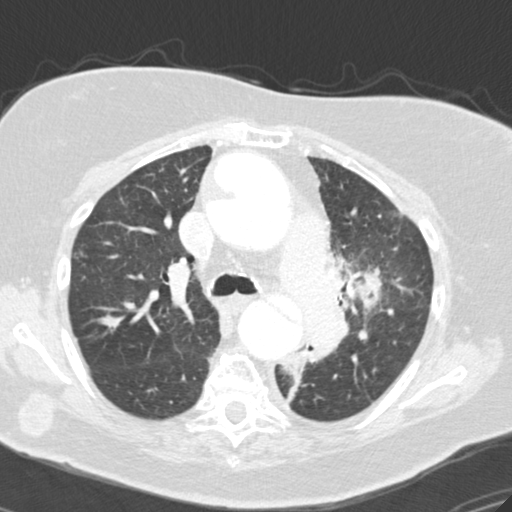
[im 122/157  lung]
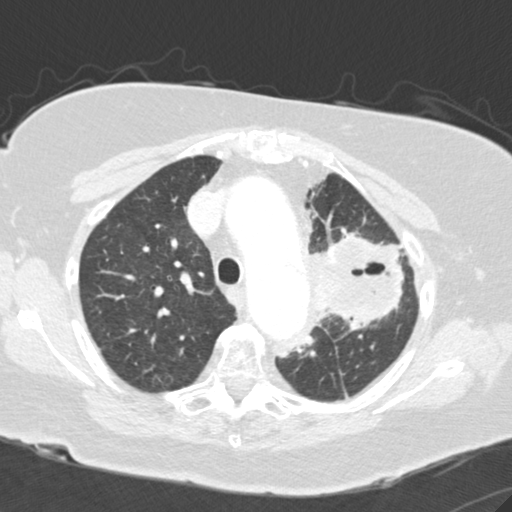
[im 133/157  lung]
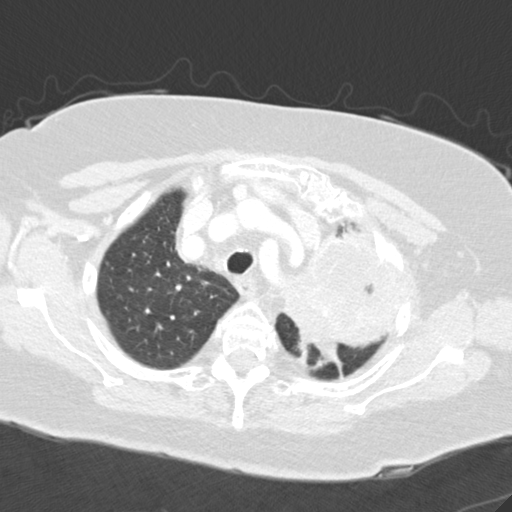
[im 145/157  lung]
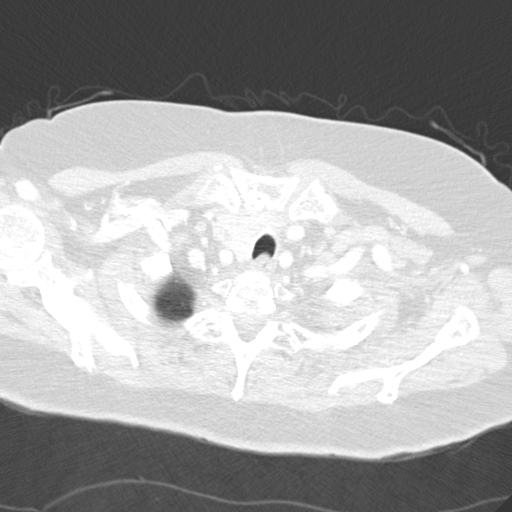

[Series 602: coronal · coronal · 0.64mm/px · 3 of 142 slices shown]
[im 29/142  lung]
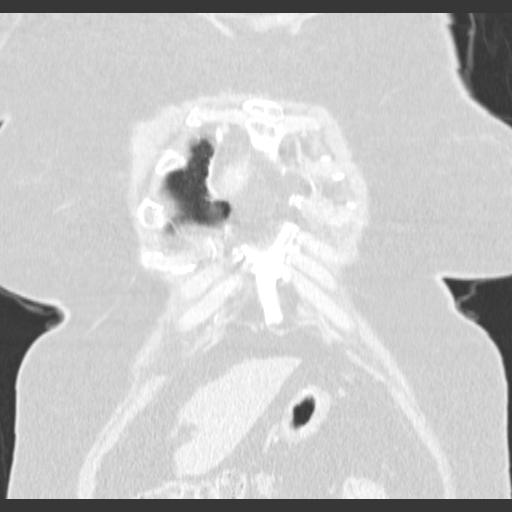
[im 57/142  lung]
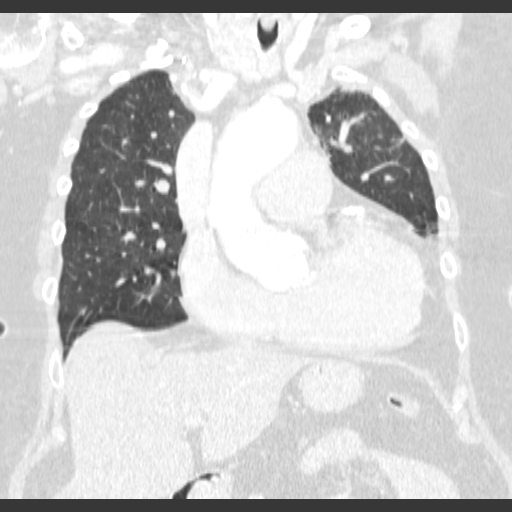
[im 85/142  lung]
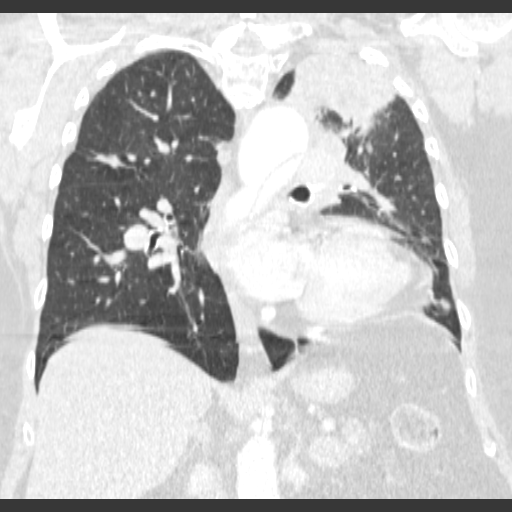

[15 of 36 positions shown; findings below may reference images not displayed]

FINDINGS: Cardiovascular: Heart is enlarged. Coronary artery calcification is
noted. Stable appearance of thoracic aortic dissection with
dissection flap extending off the bottom of this study in the
abdominal aorta.

Mediastinum/Nodes: 8 mm short axis right thoracic inlet lymph node
is stable. 8 mm short axis right paratracheal lymph node measured
previously is 7 mm today. Low right paratracheal lymph node measured
previously at 6 mm is stable. 10 mm short axis subcarinal lymph node
measured previously is 9 mm today. Bilateral thyroid nodules again
noted.

Lungs/Pleura: Left apical lesion measured on soft tissue windows
previously at 6.9 x 7.4 cm is now 7.4 x 8.4 cm. Lesion extends into
the lateral thoracic sidewall with destruction of the second and
third ribs. Persistent central necrosis/cavitation evident.
Bilateral pulmonary metastases are again noted. Posterior right
middle lobe lesion adjacent to the fissure measured previously at 14
mm is now 25 mm in the same dimension. Posterior left lower lobe
nodule measured previously at 15 mm canal 18 mm.

Upper Abdomen: [DATE] x 6.7 cm lesion in the medial spleen measured
about 2.5 x 3.0 cm on the prior study. 3 cm left adrenal lesion is
not substantially changed 2.9 cm today. Small right adrenal nodule
also stable.

Musculoskeletal: Bone windows reveal no worrisome sclerotic osseous
lesions. Lytic abnormality in the left second and third ribs again
noted.
IMPRESSION: 1. Interval progression of disease. Dominant cavitary/necrotic left
apical nodule has progressed in the interval with evidence of chest
wall involvement. Bilateral pulmonary metastases and splenic
metastasis also show interval progression.
2. Stable bilateral adrenal nodules previously characterized as
adenomas.
3. No substantial change thoracic aortic aneurysm/dissection
